# Patient Record
Sex: Male | Born: 1955 | Race: White | Hispanic: No | Smoking: Former smoker
Health system: Southern US, Community
[De-identification: ages and names within clinical notes are randomized; demographics above are authoritative.]

## PROBLEM LIST (undated history)

## (undated) DIAGNOSIS — T7840XA Allergy, unspecified, initial encounter: Secondary | ICD-10-CM

## (undated) DIAGNOSIS — Q602 Renal agenesis, unspecified: Secondary | ICD-10-CM

## (undated) DIAGNOSIS — R7989 Other specified abnormal findings of blood chemistry: Secondary | ICD-10-CM

## (undated) DIAGNOSIS — K219 Gastro-esophageal reflux disease without esophagitis: Secondary | ICD-10-CM

## (undated) DIAGNOSIS — G4730 Sleep apnea, unspecified: Secondary | ICD-10-CM

## (undated) DIAGNOSIS — R945 Abnormal results of liver function studies: Secondary | ICD-10-CM

## (undated) HISTORY — DX: Gastro-esophageal reflux disease without esophagitis: K21.9

## (undated) HISTORY — PX: OTHER SURGICAL HISTORY: SHX169

## (undated) HISTORY — PX: NASAL SEPTUM SURGERY: SHX37

## (undated) HISTORY — DX: Abnormal results of liver function studies: R94.5

## (undated) HISTORY — DX: Allergy, unspecified, initial encounter: T78.40XA

## (undated) HISTORY — DX: Renal agenesis, unspecified: Q60.2

## (undated) HISTORY — DX: Sleep apnea, unspecified: G47.30

## (undated) HISTORY — DX: Other specified abnormal findings of blood chemistry: R79.89

---

## 2001-01-12 HISTORY — PX: SHOULDER ARTHROSCOPY W/ ROTATOR CUFF REPAIR: SHX2400

## 2002-01-12 HISTORY — PX: ESOPHAGOGASTRODUODENOSCOPY: SHX1529

## 2004-02-07 ENCOUNTER — Ambulatory Visit (HOSPITAL_COMMUNITY): Admission: RE | Admit: 2004-02-07 | Discharge: 2004-02-07 | Payer: Self-pay | Admitting: Family Medicine

## 2005-01-12 HISTORY — PX: COLONOSCOPY: SHX174

## 2005-02-02 ENCOUNTER — Ambulatory Visit (HOSPITAL_COMMUNITY): Admission: RE | Admit: 2005-02-02 | Discharge: 2005-02-02 | Payer: Self-pay | Admitting: Family Medicine

## 2005-10-16 ENCOUNTER — Ambulatory Visit (HOSPITAL_COMMUNITY): Admission: RE | Admit: 2005-10-16 | Discharge: 2005-10-16 | Payer: Self-pay | Admitting: Internal Medicine

## 2005-10-16 ENCOUNTER — Ambulatory Visit: Payer: Self-pay | Admitting: Internal Medicine

## 2006-04-21 ENCOUNTER — Ambulatory Visit: Payer: Self-pay | Admitting: Gastroenterology

## 2006-04-28 ENCOUNTER — Ambulatory Visit (HOSPITAL_COMMUNITY): Admission: RE | Admit: 2006-04-28 | Discharge: 2006-04-28 | Payer: Self-pay | Admitting: Gastroenterology

## 2007-11-21 ENCOUNTER — Ambulatory Visit (HOSPITAL_COMMUNITY): Admission: RE | Admit: 2007-11-21 | Discharge: 2007-11-21 | Payer: Self-pay | Admitting: Family Medicine

## 2008-03-12 HISTORY — PX: OTHER SURGICAL HISTORY: SHX169

## 2008-11-05 ENCOUNTER — Ambulatory Visit (HOSPITAL_COMMUNITY): Admission: RE | Admit: 2008-11-05 | Discharge: 2008-11-05 | Payer: Self-pay | Admitting: Family Medicine

## 2008-11-06 ENCOUNTER — Encounter (INDEPENDENT_AMBULATORY_CARE_PROVIDER_SITE_OTHER): Payer: Self-pay | Admitting: *Deleted

## 2008-11-06 LAB — CONVERTED CEMR LAB
BUN: 18 mg/dL
Chloride: 100 meq/L
HDL: 44 mg/dL
Potassium: 4.3 meq/L

## 2009-01-02 ENCOUNTER — Encounter (INDEPENDENT_AMBULATORY_CARE_PROVIDER_SITE_OTHER): Payer: Self-pay | Admitting: *Deleted

## 2009-01-02 LAB — CONVERTED CEMR LAB
ALT: 24 units/L
Alkaline Phosphatase: 71 units/L
Bilirubin, Direct: 0.4 mg/dL
Total Protein: 7.2 g/dL

## 2009-01-03 ENCOUNTER — Ambulatory Visit (HOSPITAL_COMMUNITY): Admission: RE | Admit: 2009-01-03 | Discharge: 2009-01-03 | Payer: Self-pay | Admitting: Family Medicine

## 2009-01-03 ENCOUNTER — Encounter: Payer: Self-pay | Admitting: Cardiology

## 2009-01-10 ENCOUNTER — Ambulatory Visit (HOSPITAL_COMMUNITY): Admission: RE | Admit: 2009-01-10 | Discharge: 2009-01-10 | Payer: Self-pay | Admitting: Family Medicine

## 2009-01-21 ENCOUNTER — Ambulatory Visit (HOSPITAL_COMMUNITY)
Admission: RE | Admit: 2009-01-21 | Discharge: 2009-01-21 | Payer: Self-pay | Admitting: Physical Medicine and Rehabilitation

## 2009-01-25 ENCOUNTER — Encounter (INDEPENDENT_AMBULATORY_CARE_PROVIDER_SITE_OTHER): Payer: Self-pay | Admitting: *Deleted

## 2009-01-25 DIAGNOSIS — M2550 Pain in unspecified joint: Secondary | ICD-10-CM | POA: Insufficient documentation

## 2009-01-25 DIAGNOSIS — G4700 Insomnia, unspecified: Secondary | ICD-10-CM | POA: Insufficient documentation

## 2009-01-25 DIAGNOSIS — H9190 Unspecified hearing loss, unspecified ear: Secondary | ICD-10-CM | POA: Insufficient documentation

## 2009-01-25 DIAGNOSIS — R079 Chest pain, unspecified: Secondary | ICD-10-CM | POA: Insufficient documentation

## 2009-01-29 ENCOUNTER — Ambulatory Visit: Payer: Self-pay | Admitting: Cardiology

## 2009-01-29 ENCOUNTER — Encounter (INDEPENDENT_AMBULATORY_CARE_PROVIDER_SITE_OTHER): Payer: Self-pay | Admitting: *Deleted

## 2009-01-29 DIAGNOSIS — E785 Hyperlipidemia, unspecified: Secondary | ICD-10-CM | POA: Insufficient documentation

## 2009-02-05 ENCOUNTER — Ambulatory Visit: Payer: Self-pay | Admitting: Cardiology

## 2009-05-20 ENCOUNTER — Ambulatory Visit (HOSPITAL_COMMUNITY): Admission: RE | Admit: 2009-05-20 | Discharge: 2009-05-20 | Payer: Self-pay | Admitting: Family Medicine

## 2010-02-11 NOTE — Letter (Signed)
Summary: CT CHEST  CT CHEST   Imported By: Faythe Ghee 02/01/2009 12:19:10  _____________________________________________________________________  External Attachment:    Type:   Image     Comment:   External Document

## 2010-02-11 NOTE — Assessment & Plan Note (Signed)
Summary: PER DR.Lilyan Punt FOR PERSISTENT LEFT SIDE CHEST PAIN/TG   Visit Type:  Initial Consult Primary Provider:  Dr.Scott Luking   History of Present Illness: Initial visit for this very pleasant 55 year old gentleman with chest discomfort and cardiovascular risk factors.  Phillip Mcguire has enjoyed generally good health.  He has had mild hyperlipidemia for which he has used over-the-counter agents.  He has gastroesophageal reflux disease for which he takes a PPI.  He has had no known cardiovascular issues, has never been seen by a cardiologist and has not undergone any significant cardiac testing.  In recent months,he has experienced virtually constant left lateral chest discomfort.  There is some associated chest wall tenderness.  There is no associated dyspnea, diaphoresis nor nausea.  The discomfort is somewhat aggravated by use of the left arm, but not by walking.   Current Medications (verified): 1)  Nexium 40 Mg Cpdr (Esomeprazole Magnesium) .... Take 1 Tab Daily 2)  Glucosamine 500 Mg Caps (Glucosamine Sulfate) .... Take 1 Tab Daily 3)  Niacin 500 Mg Tabs (Niacin) .... Take 1 Tab Daily 4)  Calcium-Vitamin D 600-200 Mg-Unit Tabs (Calcium-Vitamin D) .... Take 1 Tab Daily 5)  Vitamin C Cr 500 Mg Cr-Caps (Ascorbic Acid) .... Take 1 Tab Daily 6)  Fish Oil 1000 Mg Caps (Omega-3 Fatty Acids) .... Take 1 Tab Daily 7)  One-A-Day Mens  Tabs (Multiple Vitamin) .... 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 [place] Married  Tobacco Use - remote and modest  Alcohol Use - no Regular Exercise - no Drug Use - no  Past Medical History: CHEST PAIN Congenital absence of the right kidney Abnormal LFTs-mild elevation of total, indirect  and direct bilirubin HEARING LOSS (ICD-389.9) INSOMNIA (ICD-780.52)  Past Surgical History: Left ENT procedure, 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  All other systems reviewed and are negative.  Vital Signs:  Patient profile:   55 year old male Height:      71 inches Weight:      220 pounds BMI:     30.79 Pulse rate:   64 / minute BP sitting:   118 / 70  (right arm)  Vitals Entered By: Phillip Saa, CNA (January 29, 2009 12:54 PM)  Physical Exam  General:   General-Well-developed; no acute distress; proportionate height and weight HEENT-[place]/AT; PERRL; EOM intact; conjunctiva and lids nl:  Neck-No JVD; no carotid bruits: Endocrine-No thyromegaly: Lungs-No tachypnea, clear without rales, rhonchi or wheezes: CV-normal PMI; normal S1 and S; S4 present;  Abdomen-BS normal; soft and non-tender without masses or organomegaly: MS-No deformities, cyanosis or clubbing: Neurologic-Nl cranial nerves; symmetric strength and tone: Skin- Warm, no sig. lesions: Extremities-Nl distal pulses; no edema    Impression & Recommendations:  Problem # 1:  CHEST PAIN UNSPECIFIED (ICD-786.50) Chest discomfort is highly suggestive of a musculoskeletal etiology.  Patient has  already seen an orthopaedic surgeon and is planning physical therapy.  As a result of his cardiovascular risk factors and positive family history, a standard treadmill stress test will be performed.  If results are negative, no further testing or intervention will be  necessary.  Problem # 2:  HYPERLIPIDEMIA (ICD-272.4) Patient is currently taking a low dose of niacin, which is not probably reducing total cholesterol by very much.  I explained that consideration could be given to treatment with a statin or with a higher dose of niacin for his moderate hyperlipidemia but does not meet criteria for treatment in the setting of a fairly low pretest probability of coronary disease.  If he is particularly concerned about the risk of coronary disease, enhanced pharmacologic treatment would be reasonable.  Based upon my discussion with him, it does not sound as if he wishes to change his current regimen.  Other Orders: Treadmill (Treadmill)  Patient Instructions: 1)  Your physician recommends that you schedule a follow-up appointment in: as needed 2)  Your physician has requested that you limit the intake of sodium (salt) in your diet to two grams daily. Please see [facility] handout. 3)  Your physician has requested that you have an exercise tolerance test.  For further information please visit [email or web address]  Please also follow instruction sheet, as given. 4)  DASH DIET- DIETARY APPROACHES TO STOP HYPERTENSION

## 2010-02-11 NOTE — Miscellaneous (Signed)
Summary: labs liver 01/02/2009  Clinical Lists Changes  Observations: Added new observation of ALBUMIN: 4.3 g/dL (60/45/4098 1:19) Added new observation of PROTEIN, TOT: 7.2 g/dL (14/78/2956 2:13) Added new observation of SGPT (ALT): 24 units/L (01/02/2009 9:12) Added new observation of SGOT (AST): 20 units/L (01/02/2009 9:12) Added new observation of ALK PHOS: 71 units/L (01/02/2009 9:12) Added new observation of BILI DIRECT: 0.4 mg/dL (08/65/7846 9:62) Added new observation of CALCIUM: 9.9 mg/dL (95/28/4132 4:40) Added new observation of CREATININE: 1.05 mg/dL (11/08/2534 6:44) Added new observation of BUN: 18 mg/dL (03/47/4259 5:63) Added new observation of BG RANDOM: 93 mg/dL (87/56/4332 9:51) Added new observation of CO2 PLSM/SER: 27 meq/L (11/06/2008 9:12) Added new observation of CL SERUM: 100 meq/L (11/06/2008 9:12) Added new observation of K SERUM: 4.3 meq/L (11/06/2008 9:12) Added new observation of NA: 141 meq/L (11/06/2008 9:12) Added new observation of LDL: 146 mg/dL (88/41/6606 3:01) Added new observation of HDL: 44 mg/dL (60/10/9321 5:57) Added new observation of TRIGLYC TOT: 141 mg/dL (32/20/2542 7:06) Added new observation of CHOLESTEROL: 218 mg/dL (23/76/2831 5:17)

## 2010-02-11 NOTE — Letter (Signed)
Summary:  Treadmill (Nuc Med Stress)  [facility]  [address], [place] [address]   Phone: [phone]  Fax: [phone]    Nuclear Medicine 1-Day Stress Test Information Sheet  Re:     Phillip Mcguire   DOB:     1955/09/26 MRN:     [id number] Weight:  Appointment Date: Register at: Appointment Time: Referring MD:  _X__Exercise Stress  __Adenosine   __Dobutamine  __Lexiscan  __Persantine   __Thallium  Urgency: ____1 (next day)   ____2 (one week)    ____3 (PRN)  Patient will receive Follow Up call with results: Patient needs follow-up appointment:  Instructions regarding medication:  How to prepare for your stress test: 1. DO NOT eat or dring 6 hours prior to your arrival time. This includes no caffeine (coffee, tea, sodas, chocolate) if you were instructed to take your medications, drink water with it. 2. DO NOT use any tobacco products for at leaset 8 hours prior to arrival. 3. DO NOT wear dresses or any clothing that may have metal clasps or buttons. 4. Wear short sleeve shirts, loose clothing, and comfortalbe walking shoes. 5. DO NOT use lotions, oils or powder on your chest before the test. 6. The test will take approximately 3-4 hours from the time you arrive until completion. 7. To register the day of the test, go to the Short Stay entrance at [facility]. 8. If you must cancel your test, call [phone] as soon as you are aware.  After you arrive for test:   When you arrive at [facility], you will go to Short Stay to be registered. They will then send you to Radiology to check in. The Nuclear Medicine Tech will get you and start an IV in your arm or hand. A small amount of a radioactive tracer will then be injected into your IV. This tracer will then have to circulate for 30-45 minutes. During this time you will wait in the waiting room and you will be able to drink something without caffeine. A series of pictures will be taken  of your heart follwoing this waiting period. After the 1st set of pictures you will go to the stress lab to get ready for your stress test. During the stress test, another small amount of a radioactive tracer will be injected through your IV. When the stress test is complete, there is a short rest period while your heart rate and blood pressure will be monitored. When this monitoring period is complete you will have another set of pictrues taken. (The same as the 1st set of pictures). These pictures are taken between 15 minutes and 1 hour after the stress test. The time depends on the type of stress test you had. Your doctor will inform you of your test results within 7 days after test.    The possibilities of certain changes are possible during the test. They include abnormal blood pressure and disorders of the heart. Side effects of persantine or adenosine can include flushing, chest pain, shortness of breath, stomach tightness, headache and light-headedness. These side effects usually do not last long and are self-resolving. Every effort will be made to keep you comfortable and to minimize complications by obtaining a medical history and by close observation during the test. Emergency equipment, medications, and trained personnel are available to deal with any unusual situation which may arise.  Please notify office at least 48 hours in advance if you are unable to keep  this appt.

## 2010-05-30 NOTE — Assessment & Plan Note (Signed)
[facility]                         GASTROENTEROLOGY OFFICE NOTE   FRAN, MCREE                       MRN:          [id number]  DATE:04/21/2006                            DOB:          1955-05-03    REASON FOR CONSULTATION:  Chest pain.   HISTORY:  Mr. Phillip Mcguire is a pleasant 55 year-old white male referred  through the courtesy of Dr. Gerda Diss for evaluation.  Over the past month,  he has been complaining of left-sided chest pain.  It is not exertional.  It is unaffected by eating, deep breaths, or movement.  It is described  as a soreness.  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  He denies cough, shortness of breath or fever.  Pain is somewhat  constant, and not exercise related.   PAST MEDICAL HISTORY:  Pertinent is unremarkable.  He underwent  screening colonoscopy in October 2007 that was normal.  He has a  congenitally absent right kidney.   FAMILY HISTORY:  Pertinent for both parents with heart disease.   MEDICATIONS INCLUDE:  Glucosamine and Nexium.   ALLERGIES:  None.   He neither smokes or drinks.  He is married and works in Production designer, theatre/television/film.   REVIEW OF SYSTEMS:  Positive for joint pains, insomnia, and some loss of  hearing.   PHYSICAL EXAM:  Pulse 68, blood pressure 108/60 weight 218.  HEENT: EOMI. PERRLA. Sclerae are anicteric.  Conjunctivae are pink.  NECK:  Supple without thyromegaly, adenopathy or carotid bruits.  CHEST:  He has some mild soreness in the chest wall just inferior to the  left axilla.  There is no soft tissue abnormalities. Clear to  auscultation and percussion without adventitious sounds.  CARDIAC:  Regular rhythm; normal S1 S2.  There are no murmurs, gallops  or rubs.  ABDOMEN:  Bowel sounds are normoactive.  Abdomen is soft, non-tender and  non-distended.  There are no abdominal masses, tenderness, splenic  enlargement or hepatomegaly.  EXTREMITIES:  Full range of motion.  No cyanosis, clubbing  or edema.  RECTAL:  Deferred.   IMPRESSION:  Chest wall pain.   RECOMMENDATIONS:  1. Chest x-ray.  2. A trial of Tylenol for pain.  I will avoid NSAIDs that he only has      one kidney.     Barbette Hair. Arlyce Dice, MD,FACG  Electronically Signed    RDK/MedQ  DD: 04/21/2006  DT: 04/21/2006  Job #: [id number]   cc:   Lorin Picket A. Gerda Diss, MD

## 2010-05-30 NOTE — Letter (Signed)
April 21, 2006    Scott A. Gerda Diss, MD  [address], [address]  [place], [place] [address]   RE:  ARNOLDO, HILDRETH  MRN:  [id number]  /  DOB:  1955-03-30   Dear Dr. Gerda Diss:   Upon your kind referral, I had the pleasure of evaluating your patient  and I am pleased to offer my findings.  I saw Thane Age in the office  today.  Enclosed is a copy of my progress note that details my findings  and recommendations.   Thank you for the opportunity to participate in your patient's care.    Sincerely,      Barbette Hair. Arlyce Dice, MD,FACG  Electronically Signed    RDK/MedQ  DD: 04/21/2006  DT: 04/21/2006  Job #: [id number]

## 2010-05-30 NOTE — Op Note (Signed)
NAME:  Phillip Mcguire, Phillip Mcguire                ACCOUNT NO.:  [id number]   MEDICAL RECORD NO.:  [id number]          PATIENT TYPE:  AMB   LOCATION:  DAY                           FACILITY:  [facility]   PHYSICIAN:  R. Roetta Sessions, M.D. DATE OF BIRTH:  [birth year]   DATE OF PROCEDURE:  10/16/2005  DATE OF DISCHARGE:                                 OPERATIVE REPORT   PROCEDURE:  Screening colonoscopy.   INDICATIONS FOR PROCEDURE:  Patient is a 55 year old gentleman, devoid of  lower GI symptoms who is now referred after seeing Dr. Lilyan Punt for  colorectal cancer screening.  He has no bowel symptoms.  There is no history  of in any first degree relatives of colon cancer.  He has never had his  lower GI tract imaged.  Colonoscopy is now being done.  This approach has  been discussed with the patient at length.  Potential risks, benefits and  alternatives have been reviewed and questions answered.  He is agreeable.  Please see documentation on the medical record.   PROCEDURE NOTE:  O2 saturation, blood pressure, pulses, and respirations  were monitored throughout the entire procedure.  Conscious sedation with IV  Demerol and Versed in incremental doses.   INSTRUMENT:  Olympus video chip system.   FINDINGS:  Digital rectal exam revealed no abnormalities.   ENDOSCOPIC FINDINGS:  Prep was good.   RECTAL:  Examination of the rectal mucosa, including retroflexion of the  anal verge, revealed no abnormalities.   COLON:  The colonic mucosa was surveyed from the rectosigmoid junction to  the left transverse, right colon, the appendiceal orifice, the ileocecal  valve, and cecum.  These structures were well seen and photographed for the  record.  From this level, the scope was slowly withdrawn and all previously  mentioned mucosal surfaces were again seen.  The colonic mucosa appeared  normal.  The patient tolerated the procedure well.  Cecal withdrawal time  eight minutes.   ENDOSCOPY IMPRESSION:   Normal rectum, normal colon.   RECOMMENDATIONS:  Repeat screening colonoscopy in 10 years.     [name], M.D.  Electronically Signed    RMR/MEDQ  D:  10/16/2005  T:  10/17/2005  Job:  [id number]

## 2010-06-13 ENCOUNTER — Ambulatory Visit: Payer: BC Managed Care – PPO | Attending: Family Medicine

## 2010-06-13 DIAGNOSIS — G4730 Sleep apnea, unspecified: Secondary | ICD-10-CM | POA: Insufficient documentation

## 2010-06-13 DIAGNOSIS — G4710 Hypersomnia, unspecified: Secondary | ICD-10-CM | POA: Insufficient documentation

## 2010-06-16 NOTE — Procedures (Signed)
NAME:  Phillip Mcguire, Phillip Mcguire                ACCOUNT NO.:  [id number]  MEDICAL RECORD NO.:  [id number]          PATIENT TYPE:  OUT  LOCATION:  SLEEP LAB                     FACILITY:  [facility]  PHYSICIAN:  Calvary Difranco A. Gerilyn Pilgrim, M.D. DATE OF BIRTH:  1955/03/05  DATE OF STUDY:  06/13/2010                           NOCTURNAL POLYSOMNOGRAM  REFERRING PHYSICIAN:  Lilyan Punt  REFERRING PHYSICIAN:  Scott A. Luking, MD  INDICATIONS:  A 55 year old who presents with significant hypersomnia, snoring, insomnia, and jerking while sleeping at night.  INDICATION FOR STUDY:  EPWORTH SLEEPINESS SCORE:  MEDICATIONS:  Nexium.  EPWORTH SLEEPINESS SCALE:  15.  BMI 31.  ARCHITECTURAL SUMMARY:  The total recording time is 404 minutes.  Sleep efficiency 79%.  Sleep latency 6 minutes.  REM latency 102 minutes. Stage N1 5.3%, N2 59.6%, N3 24.7%, and REM sleep 10.3%.  RESPIRATORY SUMMARY:  Baseline oxygen saturation 96, lowest saturation 89 during REM sleep.  Diagnostic AHI is 0.6 and RDI also 0.6.  LIMB MOVEMENT SUMMARY:  PLM index 0.  ELECTROCARDIOGRAM SUMMARY:  Average heart rate 65 with no significant dysrhythmias observed.  IMPRESSION:  Unremarkable nocturnal polysomnography.  RECOMMENDATIONS:  Consider further evaluation with sleep consultation to evaluate for other causes of hypersomnia, especially given the elevated Epworth sleepiness scale.  SLEEP ARCHITECTURE:  RESPIRATORY DATA:  OXYGEN DATA:  CARDIAC DATA:  MOVEMENT-PARASOMNIA:  IMPRESSIONS-RECOMMENDATIONS:     Nyron Mozer A. Gerilyn Pilgrim, M.D. Electronically Signed 06/16/2010 09:53:26    KAD/MEDQ  D:  06/15/2010 17:51:17  T:  06/16/2010 02:10:42  Job:  [id number]

## 2010-07-21 ENCOUNTER — Encounter: Payer: Self-pay | Admitting: Pulmonary Disease

## 2010-07-22 ENCOUNTER — Encounter: Payer: Self-pay | Admitting: Pulmonary Disease

## 2010-07-22 ENCOUNTER — Ambulatory Visit (INDEPENDENT_AMBULATORY_CARE_PROVIDER_SITE_OTHER): Payer: BC Managed Care – PPO | Admitting: Pulmonary Disease

## 2010-07-22 VITALS — BP 120/78 | HR 66 | Temp 97.9°F | Ht 72.0 in | Wt 222.6 lb

## 2010-07-22 DIAGNOSIS — G478 Other sleep disorders: Secondary | ICD-10-CM

## 2010-07-22 NOTE — Patient Instructions (Signed)
Will set you up for a home sleep study at no charge.  Your history is really suggestive of sleep disordered breathing Work on weight loss Work on getting more sleep at night.  You need six hours at a minimum. Will call you once I get the results.

## 2010-07-22 NOTE — Progress Notes (Signed)
  Subjective:    Patient ID: Phillip Mcguire, male    DOB: [birth year], 55 y.o.   MRN: [id number]  HPI The pt is a 55y/o male who I have been asked to see for hypersomnia.  He has recently had a sleep study which showed no clinically significant SDB, and nothing to explain his symptoms.  The pt states that he has had EDS for only a few yrs, and no issues in young adulthood.  He has been noted to have loud snoring, as well as an abnormal breathing pattern during sleep.  He does not get enough sleep at night, only averaging about 5 hrs a night.  He has frequent awakenings at night for unknown reasons, but he thinks is snoring arousals.  He is not rested upon arising, but gets up at 330am.  He stays very busy at work, but does note sleepiness at lunch breaks and with paperwork.  He does fall asleep watching tv in the evening.  He has only occasional sleepiness with driving.  He denies any chronic h/a but does have on occasions.  He has not had visual changes or other neuro symptoms.  His weight is neutral from 2 yrs ago.      Review of Systems  Constitutional: Negative for fever and unexpected weight change.  HENT: Positive for ear pain, congestion and sneezing. Negative for nosebleeds, sore throat, rhinorrhea, trouble swallowing, dental problem, postnasal drip and sinus pressure.   Eyes: Negative for redness and itching.  Respiratory: Negative for cough, chest tightness, shortness of breath and wheezing.   Cardiovascular: Negative for palpitations and leg swelling.  Gastrointestinal: Positive for abdominal pain. Negative for nausea and vomiting.  Genitourinary: Negative for dysuria.  Musculoskeletal: Positive for joint swelling.  Skin: Negative for rash.  Neurological: Positive for headaches.  Hematological: Does not bruise/bleed easily.  Psychiatric/Behavioral: Negative for dysphoric mood. The patient is not nervous/anxious.        Objective:   Physical Exam Constitutional:  Well developed, no  acute distress  HENT:  Nares patent without discharge, but deviation to left with turbinate hypertrophy bilat  Oropharynx without exudate, palate and uvula are elongated, +tonsillar hypertrophy  Eyes:  Perrla, eomi, no scleral icterus  Neck:  No JVD, no TMG  Cardiovascular:  Normal rate, regular rhythm, no rubs or gallops.  No murmurs        Intact distal pulses  Pulmonary :  Normal breath sounds, no stridor or respiratory distress   No rales, rhonchi, or wheezing  Abdominal:  Soft, nondistended, bowel sounds present.  No tenderness noted.   Musculoskeletal:  No lower extremity edema noted.  Lymph Nodes:  No cervical lymphadenopathy noted  Skin:  No cyanosis noted  Neurologic:  Alert, appropriate, moves all 4 extremities without obvious deficit.         Assessment & Plan:

## 2010-07-22 NOTE — Assessment & Plan Note (Addendum)
The pt's history is very suggestive of sleep disordered breathing.  He has abnormal upper airway anatomy, has frequent awakenings that he believes is due to snoring, and has non restorative sleep with EDS.  Part of this may be due to his short total sleep time, and awakening at 3:30 to go to work.  There is nothing to suggest a daytime sleep disorder such as narcolepsy or idiopathic hypersomnia.  He has no neuro symptoms suggestive of a CNS lesion, but needs to be kept in mind.  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  I would like to do a home sleep test at no charge to him, to give him every opportunity to manifest OSA.  If this is unremarkable, the pt will have to make some decisions regarding trial of weight loss vs upper airway surgery vs dental appliance.  Will discuss with him once results are available.  Will also need to consider doing MSLT for further evaluation.

## 2010-07-27 ENCOUNTER — Encounter: Payer: Self-pay | Admitting: Pulmonary Disease

## 2010-07-28 ENCOUNTER — Telehealth: Payer: Self-pay | Admitting: Pulmonary Disease

## 2010-07-28 NOTE — Telephone Encounter (Signed)
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  Unclear whether this is the issue with the pt vs sleep hygiene vs undiagnosed problem.  Will arrange for ov to discuss and develop a treatment plan  Megan, let pt know his sleep study showed extremely mild osa.  He needs ov this week or after I get back from vacation to discuss and come up with a plan.

## 2010-07-28 NOTE — Telephone Encounter (Signed)
Called and spoke with pt. Pt scheduled to see kc on wed. 7/19 at 2:15pm

## 2010-07-30 ENCOUNTER — Encounter: Payer: Self-pay | Admitting: Pulmonary Disease

## 2010-07-30 ENCOUNTER — Ambulatory Visit (INDEPENDENT_AMBULATORY_CARE_PROVIDER_SITE_OTHER): Payer: BC Managed Care – PPO | Admitting: Pulmonary Disease

## 2010-07-30 VITALS — BP 106/70 | HR 60 | Temp 98.0°F | Ht 72.0 in | Wt 220.0 lb

## 2010-07-30 DIAGNOSIS — G4733 Obstructive sleep apnea (adult) (pediatric): Secondary | ICD-10-CM

## 2010-07-30 NOTE — Patient Instructions (Signed)
Will start on cpap at moderate pressure level.  Please call if having tolerance issues. Work on weight loss followup with me in 5 weeks.  

## 2010-08-02 ENCOUNTER — Encounter: Payer: Self-pay | Admitting: Pulmonary Disease

## 2010-08-02 NOTE — Assessment & Plan Note (Signed)
The pt has mild osa by his recent home sleep study, and has significant sleep disruption and daytime sleepiness.   I have outlined various treatment options for him, including trial of weight loss alone, upper airway surgery, dental appliance, and cpap.  It is unclear how much this degree of osa is impacting his symptoms, and therefore would consider cpap trial first.  The pt is willing to try. I will set the patient up on cpap at a moderate pressure level to allow for desensitization, and will troubleshoot the device over the next 4-6weeks if needed.  The pt is to call me if having issues with tolerance.  Will then optimize the pressure once patient is able to wear cpap on a consistent basis.

## 2010-08-02 NOTE — Progress Notes (Signed)
  Subjective:    Patient ID: Phillip Mcguire, male    DOB: 12-24-1955, 55 y.o.   MRN: [id number]  HPI The pt comes in today for f/u of his recent home sleep study.  He was found to have mild osa, with AHI 5/hr.  I have reviewed the study with him in detail, and answered all of his questions.   Review of Systems  Constitutional: Negative for fever and unexpected weight change.  HENT: Negative for ear pain, nosebleeds, congestion, sore throat, rhinorrhea, sneezing, trouble swallowing, dental problem, postnasal drip and sinus pressure.   Eyes: Negative for redness and itching.  Respiratory: Negative for cough, chest tightness, shortness of breath and wheezing.   Cardiovascular: Negative for palpitations and leg swelling.  Gastrointestinal: Negative for nausea and vomiting.  Genitourinary: Negative for dysuria.  Musculoskeletal: Negative for joint swelling.  Skin: Negative for rash.  Neurological: Negative for headaches.  Hematological: Does not bruise/bleed easily.  Psychiatric/Behavioral: Negative for dysphoric mood. The patient is not nervous/anxious.        Objective:   Physical Exam Ow male in nad Nares without discharge or purulence LE without edema, no cyanosis noted. Awake, but appears sleepy.  Moves all 4        Assessment & Plan:

## 2010-08-07 ENCOUNTER — Encounter: Payer: Self-pay | Admitting: Pulmonary Disease

## 2010-09-03 ENCOUNTER — Ambulatory Visit (INDEPENDENT_AMBULATORY_CARE_PROVIDER_SITE_OTHER): Payer: BC Managed Care – PPO | Admitting: Pulmonary Disease

## 2010-09-03 ENCOUNTER — Encounter: Payer: Self-pay | Admitting: Pulmonary Disease

## 2010-09-03 VITALS — BP 126/70 | HR 63 | Temp 98.3°F | Ht 72.0 in | Wt 222.6 lb

## 2010-09-03 DIAGNOSIS — G4733 Obstructive sleep apnea (adult) (pediatric): Secondary | ICD-10-CM

## 2010-09-03 NOTE — Progress Notes (Signed)
  Subjective:    Patient ID: Phillip Mcguire, male    DOB: 1955-09-30, 55 y.o.   MRN: [id number]  HPI The patient comes in today for follow up of his very mild sleep apnea.  He was having frequent awakenings at night, nonrestorative sleep, and daytime sleepiness.  However, his AHI was only 5 per hour.  The patient also has a short total sleep time, typically sleeping no more than 5 hours a night and starting his day at 3:30 AM.  He has been on CPAP at a moderate pressure levels since the last visit, but has not really seen a change in his sleep or daytime alertness.  He feels that he is "still getting used to the machine".   Review of Systems  Constitutional: Negative for fever and unexpected weight change.  HENT: Positive for congestion, sneezing and sinus pressure. Negative for ear pain, nosebleeds, sore throat, rhinorrhea, trouble swallowing, dental problem and postnasal drip.   Eyes: Positive for itching. Negative for redness.  Respiratory: Negative for cough, chest tightness, shortness of breath and wheezing.   Cardiovascular: Negative for palpitations and leg swelling.  Gastrointestinal: Negative for nausea and vomiting.  Genitourinary: Negative for dysuria.  Musculoskeletal: Negative for joint swelling.  Skin: Negative for rash.  Neurological: Positive for headaches.  Hematological: Does not bruise/bleed easily.  Psychiatric/Behavioral: Negative for dysphoric mood. The patient is not nervous/anxious.        Objective:   Physical Exam Obese male in no acute distress No skin breakdown or pressure necrosis from the CPAP mask No purulence or discharge from nose Lower extremities with mild edema, no cyanosis noted Appears mildly sleepy, moves all 4 extremities.       Assessment & Plan:

## 2010-09-03 NOTE — Assessment & Plan Note (Signed)
The patient has fragmented sleep and alertness issues during the day, but it is unclear if it is due to his very mild sleep apnea.  The fact that he saw no difference with CPAP at a moderate setting makes me question whether sleep disordered breathing is the issue here.  This may simply be due to his poor sleep hygiene and limited quantity of sleep.  He may also have some other issue that we have not pinpointed that is disrupting his sleep as well.  He really has no history to suggest narcolepsy or idiopathic hypersomnia.  I would like to change his CPAP machine over to the automatic mode for a few weeks, and see if he notices a difference.  Will also get a download to make sure that he is wearing the device.  If he truly does not respond to CPAP, would discontinue.  Then we would have to decide whether to place the burden on the patient to improve his sleep hygiene, whether to check a MS LT, or whether we empirically treat him with a stimulant medication during the day to help with his alertness and functional status.

## 2010-09-03 NOTE — Patient Instructions (Signed)
Will set machine on auto mode for the next 2-3 weeks to optimize your pressure Please call us the day your machine gets downloaded, so we can call and get report faxed to [place]. Work on [employer] loss Will call you once I get the downloaded report.

## 2011-07-24 ENCOUNTER — Other Ambulatory Visit: Payer: Self-pay | Admitting: Family Medicine

## 2011-07-24 ENCOUNTER — Ambulatory Visit (HOSPITAL_COMMUNITY)
Admission: RE | Admit: 2011-07-24 | Discharge: 2011-07-24 | Disposition: A | Discharge status: Home / Self Care | Payer: BC Managed Care – PPO | Source: Ambulatory Visit | Attending: Family Medicine | Admitting: Family Medicine

## 2011-07-24 DIAGNOSIS — R05 Cough: Secondary | ICD-10-CM

## 2011-07-24 DIAGNOSIS — R059 Cough, unspecified: Secondary | ICD-10-CM | POA: Insufficient documentation

## 2012-05-23 ENCOUNTER — Other Ambulatory Visit: Payer: Self-pay | Admitting: Family Medicine

## 2012-10-08 ENCOUNTER — Encounter: Payer: Self-pay | Admitting: *Deleted

## 2012-10-13 ENCOUNTER — Encounter: Payer: Self-pay | Admitting: Family Medicine

## 2012-10-13 ENCOUNTER — Ambulatory Visit (INDEPENDENT_AMBULATORY_CARE_PROVIDER_SITE_OTHER): Payer: BC Managed Care – PPO | Admitting: Family Medicine

## 2012-10-13 VITALS — BP 122/80 | Ht 71.5 in | Wt 221.0 lb

## 2012-10-13 DIAGNOSIS — J019 Acute sinusitis, unspecified: Secondary | ICD-10-CM

## 2012-10-13 DIAGNOSIS — M2550 Pain in unspecified joint: Secondary | ICD-10-CM

## 2012-10-13 DIAGNOSIS — G629 Polyneuropathy, unspecified: Secondary | ICD-10-CM

## 2012-10-13 DIAGNOSIS — G609 Hereditary and idiopathic neuropathy, unspecified: Secondary | ICD-10-CM

## 2012-10-13 DIAGNOSIS — E538 Deficiency of other specified B group vitamins: Secondary | ICD-10-CM

## 2012-10-13 DIAGNOSIS — E539 Vitamin B deficiency, unspecified: Secondary | ICD-10-CM

## 2012-10-13 MED ORDER — LEVOFLOXACIN 500 MG PO TABS: 500.0000 mg | ORAL_TABLET | Freq: Every day | ORAL | Status: AC

## 2012-10-13 NOTE — Patient Instructions (Signed)
Stop Niacin

## 2012-10-13 NOTE — Progress Notes (Signed)
  Subjective:    Patient ID: Phillip Mcguire, male    DOB: 07-Jan-1956, 57 y.o.   MRN: [id number]  Sinusitis This is a new problem. The current episode started 1 to 4 weeks ago. The problem is unchanged. There has been no fever. He is experiencing no pain. Associated symptoms include coughing, sinus pressure and sneezing. Past treatments include nothing. The treatment provided no relief.    Patient states that his feet has been burning on and off for about several months now.  Some tinglimg feels on fire by the end of the day. Tried nothing. No history of this Had wart on r foot , gone now   Review of Systems  HENT: Positive for sneezing and sinus pressure.   Respiratory: Positive for cough.    Patient does relate a little bit of foot burning. He states his feet been burning over the past several weeks he also states recently sinus pressure drainage coughing denies wheezing denies shortness of breath. Patient states he's tried nothing for the feet he has a family history of diabetes has never had this problem before. Social does not smoke denies excessive thirst or urination.    Objective:   Physical Exam Neck is supple sinus moderate tenderness throat normal eardrums normal lungs are clear hearts regular pulses in the feet are good there is some rubor appearance to the feet but pulses are good monofilament testing he has slight neuropathy in the big toe but otherwise good       Assessment & Plan:  Neuropathy-check lab work first if lab work is negative next step is nerve conduction studies. If this is negative might try low-dose gabapentin or potentially get neurologic consultation  Sinusitis antibiotics prescribed call if ongoing troubles

## 2012-10-14 LAB — CBC WITH DIFFERENTIAL/PLATELET
HCT: 45.3 % (ref 39.0–52.0)
Hemoglobin: 15.8 g/dL (ref 13.0–17.0)
Lymphocytes Relative: 42 % (ref 12–46)
Lymphs Abs: 3.5 10*3/uL (ref 0.7–4.0)
MCHC: 34.9 g/dL (ref 30.0–36.0)
Monocytes Absolute: 0.6 10*3/uL (ref 0.1–1.0)
Monocytes Relative: 7 % (ref 3–12)
Neutro Abs: 4.2 10*3/uL (ref 1.7–7.7)
RBC: 5.35 MIL/uL (ref 4.22–5.81)

## 2012-10-14 LAB — SEDIMENTATION RATE: Sed Rate: 1 mm/hr (ref 0–16)

## 2012-10-14 LAB — HEMOGLOBIN A1C: Mean Plasma Glucose: 123 mg/dL — ABNORMAL HIGH (ref <117)

## 2012-10-14 LAB — VITAMIN B12: Vitamin B-12: 708 pg/mL (ref 211–911)

## 2012-10-20 ENCOUNTER — Encounter: Payer: Self-pay | Admitting: Family Medicine

## 2012-10-20 ENCOUNTER — Ambulatory Visit (INDEPENDENT_AMBULATORY_CARE_PROVIDER_SITE_OTHER): Payer: BC Managed Care – PPO | Admitting: Family Medicine

## 2012-10-20 VITALS — BP 128/86 | Ht 71.5 in | Wt 220.8 lb

## 2012-10-20 DIAGNOSIS — R7303 Prediabetes: Secondary | ICD-10-CM | POA: Insufficient documentation

## 2012-10-20 DIAGNOSIS — G629 Polyneuropathy, unspecified: Secondary | ICD-10-CM | POA: Insufficient documentation

## 2012-10-20 DIAGNOSIS — G609 Hereditary and idiopathic neuropathy, unspecified: Secondary | ICD-10-CM

## 2012-10-20 DIAGNOSIS — R7309 Other abnormal glucose: Secondary | ICD-10-CM

## 2012-10-20 MED ORDER — CEFPROZIL 500 MG PO TABS: 500.0000 mg | ORAL_TABLET | Freq: Two times a day (BID) | ORAL | Status: DC

## 2012-10-20 MED ORDER — GABAPENTIN 100 MG PO CAPS: 100.0000 mg | ORAL_CAPSULE | Freq: Three times a day (TID) | ORAL | Status: DC

## 2012-10-20 NOTE — Progress Notes (Signed)
  Subjective:    Patient ID: Phillip Mcguire, male    DOB: 1955-03-19, 57 y.o.   MRN: [id number]  HPI  Patient arrives for a follow up of burning in his feet. Also c/o r ear pain Labs reviewed   Review of Systems  Constitutional: Negative for fever, chills and appetite change.  HENT: Positive for ear pain and sinus pressure.   Respiratory: Negative for cough and chest tightness.   Cardiovascular: Negative for chest pain.       Objective:   Physical Exam  Vitals reviewed. Constitutional: He appears well-developed and well-nourished.  HENT:  Left Ear: External ear normal.  r ear inflammed  Cardiovascular: Normal rate, regular rhythm and normal heart sounds.           Assessment & Plan:  20 min discussed periph neuropathy to do NCV Gave rx for neurontin- start 100 mg qhs for 5 days then 1 bid ( may neeed to go to tid) start med afetr NCV Prediabetes- could be cause of neuropathy- diet discussed

## 2012-11-15 ENCOUNTER — Ambulatory Visit (INDEPENDENT_AMBULATORY_CARE_PROVIDER_SITE_OTHER): Payer: BC Managed Care – PPO | Admitting: Neurology

## 2012-11-15 ENCOUNTER — Encounter: Payer: Self-pay | Admitting: Neurology

## 2012-11-15 VITALS — BP 116/70 | HR 68 | Temp 98.2°F | Resp 16 | Ht 71.0 in | Wt 218.4 lb

## 2012-11-15 DIAGNOSIS — R209 Unspecified disturbances of skin sensation: Secondary | ICD-10-CM

## 2012-11-15 NOTE — Patient Instructions (Addendum)
1.  Check blood work today 2.  Start neurontin 100mg  at bedtime x 3 days, then 200mg  at bedtime x 3 days, then continue 300mg  at bedtime 3.  EMG of the right 4.  Return to clinic in 4 weeks

## 2012-11-15 NOTE — Progress Notes (Signed)
[facility] Clinic Note - Initial Visit   Date: 11/15/2012    Phillip Mcguire MRN: [id number] DOB: 08/08/1955   Dear Dr Gerda Diss:  Thank you for your kind referral of Phillip Mcguire for consultation of burning feet. Although his history is well known to you, please allow [place] to reiterate it for the purpose of our medical record. The patient was accompanied to the clinic by self.   History of Present Illness: Phillip Mcguire is a 57 y.o. year-old left-handed Caucasian male with history of GERD and arthritis presenting for evaluation of burning feet (right > left).  About 32-months ago, he started developing burning sensation at the sole of his feet.  Symptoms have become more constant over the past several months.  He started noticing redness over the dorsum of the feet a few months ago.  Being on his feet all day makes it worse.  No alleviating factors.  Denies any numbness or weakness.  He is concerned about diabetes because he has family history of it and stopped drinking sweet tea and soda, which he feels has helped somewhat.  He endorses dry mouth or reduced sweating.  Denies dry eyes or early satiety.  Out-side paper records, electronic medical record, and images have been reviewed where available and summarized as:  Component     Latest Ref Rng 10/13/2012  Hemoglobin A1C     <5.7 % 5.9 (H)  Mean Plasma Glucose     <117 mg/dL 784 (H)  Vitamin O-96     211 - 911 pg/mL 708  Sed Rate     0 - 16 mm/hr 1    Past Medical History  Diagnosis Date  . Hearing loss   . Abnormal LFTs     mild elevation of total, indirect and direct bilirubin  . Congenital absence of kidney     Right  . GERD (gastroesophageal reflux disease)   . Allergy   . Sleep apnea     Past Surgical History  Procedure Laterality Date  . Otosclerosis      bilateral ear surgery  . Nasal septum surgery  1980's  . Esophagogastroduodenoscopy  01/2002  . Colonoscopy  2007  . Titanium implant  Left March 2010    left middle ear  . Shoulder arthroscopy w/ rotator cuff repair Left 2003     Medications:  Current Outpatient Prescriptions on File Prior to Visit  Medication Sig Dispense Refill  . Cholecalciferol (VITAMIN D3) 1000 UNITS CAPS Take 1 capsule by mouth daily.        . Glucosamine-Chondroit-Vit C-Mn (GLUCOSAMINE 1500 COMPLEX PO) Take 1 capsule by mouth daily.        Marland Kitchen loratadine (CLARITIN) 10 MG tablet Take 10 mg by mouth daily.        . Multiple Vitamin (MULTIVITAMIN) capsule Take 1 capsule by mouth daily.        Marland Kitchen NEXIUM 40 MG capsule TAKE 1 CAPSULE BY MOUTH EVERY DAY  90 capsule  1  . vitamin C (ASCORBIC ACID) 500 MG tablet Take 240 mg by mouth daily.        No current facility-administered medications on file prior to visit.    Allergies:  Allergies  Allergen Reactions  . Latex     Family History: Family History  Problem Relation Age of Onset  . Heart disease Mother   . Hypertension Mother   . Diabetes Mother   . Hyperlipidemia Mother   . Heart disease Father   .  Lung cancer Father   . Hyperlipidemia Father   . Allergies Son   . Cancer Maternal Aunt     breast  . Cancer Maternal Uncle     colon  . Cancer Paternal Aunt     breast  . Cancer Paternal Uncle     brain tumor    Social History: History   Social History  . Marital Status: Married    Spouse Name: N/A    Number of Children: 1  . Years of Education: N/A   Occupational History  . 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     [employer]   Social History Main Topics  . Smoking status: Former Smoker -- 1.50 packs/day for 15 years    Types: Cigarettes    Quit date: 01/13/1988  . Smokeless tobacco: Never Used  . Alcohol Use: Yes     Comment: rarely drink beer  . Drug Use: No  . Sexual Activity: Not on file   Other Topics Concern  . Not on file   Social History Narrative   He works in [employer], Engineer, building services   Lives with wife.  They have one child.    Review of Systems:   CONSTITUTIONAL: No fevers, chills, night sweats, or weight loss.   EYES: No visual changes or eye pain ENT: No hearing changes.  No history of nose bleeds.   RESPIRATORY: No cough, wheezing and shortness of breath.   CARDIOVASCULAR: Negative for chest pain, and palpitations.   GI: Negative for abdominal discomfort, blood in stools or black stools.  No recent change in bowel habits.   GU:  No history of incontinence.   MUSCLOSKELETAL: No history of joint pain or swelling.  No myalgias.   SKIN: Negative for lesions, rash, and itching.   HEMATOLOGY/ONCOLOGY: Negative for prolonged bleeding, bruising easily, and swollen nodes.  ENDOCRINE: Negative for cold or heat intolerance, polydipsia or goiter.   PSYCH:  No depression or anxiety symptoms.   NEURO: As Above.   Vital Signs:  BP 116/70  Pulse 68  Temp(Src) 98.2 F (36.8 C) (Oral)  Resp 16  Ht 5\' 11"  (1.803 m)  Wt 218 lb 6.4 oz (99.066 kg)  BMI 30.47 kg/m2  Neurological Exam: MENTAL STATUS including orientation to time, place, person, recent and remote memory, attention span and concentration, language, and fund of knowledge is normal.  Speech is not dysarthric.  CRANIAL NERVES: II:  No visual field defects.  Unremarkable fundi.   III-IV-VI: Pupils equal round and reactive to light.  Normal conjugate, extra-ocular eye movements in all directions of gaze.  No nystagmus.  No ptosis prior to or post sustained upgaze.   V:  Normal facial sensation.   VII:  Normal facial symmetry and movements.  VIII:  Normal hearing and vestibular function.   IX-X:  Normal palatal movement.   XI:  Normal shoulder shrug and head rotation.   XII:  Normal tongue strength and range of motion, no deviation or fasciculation.  MOTOR:  No atrophy, fasciculations or abnormal movements.  No pronator drift.  Tone is normal.    Right Upper Extremity:    Left Upper Extremity:    Deltoid  5/5   Deltoid  5/5   Biceps  5/5   Biceps  5/5   Triceps  5/5   Triceps   5/5   Wrist extensors  5/5   Wrist extensors  5/5   Wrist flexors  5/5   Wrist flexors  5/5   Finger extensors  5/5   Finger extensors  5/5   Finger flexors  5/5   Finger flexors  5/5   Dorsal interossei  5/5   Dorsal interossei  5/5   Abductor pollicis  5/5   Abductor pollicis  5/5   Tone (Ashworth scale)  0  Tone (Ashworth scale)  0   Right Lower Extremity:    Left Lower Extremity:    Hip flexors  5/5   Hip flexors  5/5   Hip extensors  5/5   Hip extensors  5/5   Knee flexors  5/5   Knee flexors  5/5   Knee extensors  5/5   Knee extensors  5/5   Dorsiflexors  5/5   Dorsiflexors  5/5   Plantarflexors  5/5   Plantarflexors  5/5   Toe extensors  5/5   Toe extensors  5/5   Toe flexors  5/5   Toe flexors  5/5   Tone (Ashworth scale)  0  Tone (Ashworth scale)  0   MSRs:  Right                                                                 Left brachioradialis 2+  brachioradialis 2+  biceps 2+  biceps 2+  triceps 2+  triceps 2+  patellar 2+  patellar 2+  ankle jerk 2+  ankle jerk 2+  Hoffman no  Hoffman no  plantar response down  plantar response down   SENSORY:  Normal and symmetric perception of light touch, pinprick, vibration, and proprioception.  Romberg's sign absent.   COORDINATION/GAIT: Normal finger-to- nose-finger and heel-to-shin.  Intact rapid alternating movements bilaterally.  Able to rise from a chair without using arms.  Gait narrow based and stable. Tandem and stressed gait intact.    IMPRESSION: Mr. Phillip Mcguire is a 57 year old gentleman presenting for paresthesias of his feet. His neurological examination is entirely normal and non-focal.  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  I will check for treatable causes of neuropathy and obtain an EMG of the right lower extremity. In the meantime, for symptomatic management, I instructed him to take neurontin.   PLAN/RECOMMENDATIONS:  1.  Check 2hr  glucose tolerance test, TSH, copper, ceruloplasmin, SPEP/UPEP with IFE 2.  Start neurontin 100mg  and titrate further as instucted 3.  EMG of the right 4.  Return to clinic in 4 weeks  The duration of this appointment visit was 60 minutes of face-to-face time with the patient.  Greater than 50% of this time was spent in counseling, explanation of diagnosis, planning of further management, and coordination of care.   Thank you for allowing me to participate in patient's care.  If I can answer any additional questions, I would be pleased to do so.    Sincerely,    Viliami Bracco K. Allena Katz, DO

## 2012-11-16 ENCOUNTER — Other Ambulatory Visit: Payer: BC Managed Care – PPO

## 2012-11-16 DIAGNOSIS — R209 Unspecified disturbances of skin sensation: Secondary | ICD-10-CM

## 2012-11-16 LAB — GLUCOSE TOLERANCE, 2 HOURS
Glucose, 1 Hour GTT: 172 mg/dL
Glucose, 2 hour: 97 mg/dL
Glucose, Fasting: 100 mg/dL — ABNORMAL HIGH (ref 70–99)

## 2012-11-16 LAB — TSH: TSH: 1.16 u[IU]/mL (ref 0.35–5.50)

## 2012-11-16 NOTE — Addendum Note (Signed)
Addended by: Sadie Haber D on: 11/16/2012 08:08 AM   Modules accepted: Orders

## 2012-11-17 LAB — UIFE/LIGHT CHAINS/TP QN, 24-HR UR
Albumin, U: DETECTED
Free Kappa/Lambda Ratio: 8 ratio (ref 2.04–10.37)
Free Lambda Lt Chains,Ur: 0.03 mg/dL (ref 0.02–0.67)
Total Protein, Urine: 0.8 mg/dL

## 2012-11-17 LAB — PROTEIN ELECTROPHORESIS, SERUM
Albumin ELP: 61.2 % (ref 55.8–66.1)
Alpha-1-Globulin: 3.8 % (ref 2.9–4.9)
Alpha-2-Globulin: 7.7 % (ref 7.1–11.8)
Beta 2: 6.4 % (ref 3.2–6.5)
Beta Globulin: 7.3 % — ABNORMAL HIGH (ref 4.7–7.2)
Total Protein, Serum Electrophoresis: 7 g/dL (ref 6.0–8.3)

## 2012-11-24 ENCOUNTER — Ambulatory Visit (INDEPENDENT_AMBULATORY_CARE_PROVIDER_SITE_OTHER): Payer: BC Managed Care – PPO | Admitting: Neurology

## 2012-11-24 ENCOUNTER — Encounter: Payer: Self-pay | Admitting: Neurology

## 2012-11-24 DIAGNOSIS — R209 Unspecified disturbances of skin sensation: Secondary | ICD-10-CM

## 2012-11-24 NOTE — Progress Notes (Signed)
See procedure note under "Notes" for EMG results.  Donika K. Patel, DO  

## 2012-11-24 NOTE — Procedures (Signed)
[facility]  [address] [place], [address]  [place], [place] [address] Tel: [phone] Fax:  [phone] Test Date:  11/24/2012  Patient: Phillip Mcguire DOB: 11-26-1955 Physician: Nita Sickle, DO  Sex: Male Height: 5\' 11"  Ref Phys:   ID#: [id number]   Technician:    Patient Complaints: 57 year-old man presenting with paresthesias of his feet.  NCV & EMG Findings:  Extensive evaluation of the right lower extremity reveals: 1. Normal sural and superficial peroneal sensory responses. 2. Normal motor response of the peroneal and tibial nerves. 3. No evidence of active or chronic motor axonal loss on needle electrode examination.      Impression: This is a normal electrodiagnostic study of the right lower extremity. There is no evidence of a lumbosacral radiculopathy or large fiber generalized sensorimotor polyneuropathy affecting the right lower extremity. However, a small fiber sensory neuropathy cannot be excluded by this study.   ___________________________ Nita Sickle, DO    Nerve Conduction Studies Anti Sensory Summary Table   Site NR Peak (ms) Norm Peak (ms) P-T Amp (V) Norm P-T Amp  Right Sup Peroneal Anti Sensory (Ant Lat Mall)  12 cm    3.9 <4.6 9.8 >4  Right Sural Anti Sensory (Lat Mall)  Calf    3.9 <4.6 11.7 >4   Motor Summary Table   Site NR Onset (ms) Norm Onset (ms) O-P Amp (mV) Norm O-P Amp Site1 Site2 Delta-0 (ms) Dist (cm) Vel (m/s) Norm Vel (m/s)  Right Peroneal Motor (Ext Dig Brev)  Ankle    4.1 <6.0 7.1 >2.5 B Fib Ankle 10.1 41.0 41 >40  B Fib    14.2  6.1  Poplt B Fib 1.4 8.0 57 >40  Poplt    15.6  5.9         Right Tibial Motor (Abd Hall Brev)  Ankle    5.5 <6.0 9.0 >4 Knee Ankle 9.7 44.0 45 >40  Knee    15.2  6.0          H Reflex Studies   NR H-Lat (ms) Lat Norm (ms) L-R H-Lat (ms)  Left Tibial (Gastroc)     37.33 <35 0.45  Right Tibial (Gastroc)     37.78 <35 0.45   EMG   Side Muscle Ins Act Fibs Psw Fasc Number Recrt Dur  Dur. Amp Amp. Poly Poly. Comment  Right AntTibialis Nml Nml Nml Nml Nml Nml Nml Nml Nml Nml Nml Nml N/A  Right Gastroc Nml Nml Nml Nml 1- Mod Nml Nml Nml Nml Nml Nml N/A  Right Flex Dig Long Nml Nml Nml Nml Nml Nml Nml Nml Nml Nml Nml Nml N/A  Right RectFemoris Nml Nml Nml Nml Nml Nml Nml Nml Nml Nml Nml Nml N/A  Right GluteusMed Nml Nml Nml Nml Nml Nml Nml Nml Nml Nml Nml Nml N/A  Right BicepsFemS Nml Nml Nml Nml Nml Nml Nml Nml Nml Nml Nml Nml N/A      Waveforms:

## 2012-12-12 ENCOUNTER — Encounter: Payer: Self-pay | Admitting: Neurology

## 2012-12-13 ENCOUNTER — Encounter: Payer: Self-pay | Admitting: Neurology

## 2012-12-13 ENCOUNTER — Ambulatory Visit (INDEPENDENT_AMBULATORY_CARE_PROVIDER_SITE_OTHER): Payer: BC Managed Care – PPO | Admitting: Neurology

## 2012-12-13 VITALS — BP 110/68 | HR 68 | Temp 97.7°F | Ht 71.0 in | Wt 218.0 lb

## 2012-12-13 DIAGNOSIS — M545 Low back pain: Secondary | ICD-10-CM

## 2012-12-13 DIAGNOSIS — R209 Unspecified disturbances of skin sensation: Secondary | ICD-10-CM

## 2012-12-13 DIAGNOSIS — R208 Other disturbances of skin sensation: Secondary | ICD-10-CM

## 2012-12-13 NOTE — Progress Notes (Signed)
Follow-up Visit   Date: 12/13/2012    ROLAN WRIGHTSMAN MRN: [id number] DOB: 08-31-1955   Interim History: Phillip Mcguire is a 57 y.o. year-old left-handed Caucasian male with history of GERD and arthritis returning for follow-up of burning feet (right > left). He was last seen in the office on 11/15/2012.  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  He purchased new shoes which he thinks may also be helping because they are lighter.The redness over the dorsum of his feet has resolved.  Denies any numbness or weakness.  Neuropathy labs and EMG was normal.   History of present illness: About 29-months ago, he started developing burning sensation at the sole of his feet. Symptoms have become more constant over the past several months. He started noticing redness over the dorsum of the feet a few months ago. Being on his feet all day makes it worse.  He is concerned about diabetes because he has family history of it and stopped drinking sweet tea and soda, which he feels has helped somewhat. He endorses dry mouth or reduced sweating.   Medications:  Current Outpatient Prescriptions on File Prior to Visit  Medication Sig Dispense Refill  . Cholecalciferol (VITAMIN D3) 1000 UNITS CAPS Take 1 capsule by mouth daily.        . Glucosamine-Chondroit-Vit C-Mn (GLUCOSAMINE 1500 COMPLEX PO) Take 1 capsule by mouth daily.        Marland Kitchen loratadine (CLARITIN) 10 MG tablet Take 10 mg by mouth daily.        . Multiple Vitamin (MULTIVITAMIN) capsule Take 1 capsule by mouth daily.        Marland Kitchen NEXIUM 40 MG capsule TAKE 1 CAPSULE BY MOUTH EVERY DAY  90 capsule  1  . vitamin C (ASCORBIC ACID) 500 MG tablet Take 240 mg by mouth daily.        No current facility-administered medications on file prior to visit.    Allergies:  Allergies  Allergen Reactions  . Latex      Review of  Systems:  CONSTITUTIONAL: No fevers, chills, night sweats, or weight loss.   EYES: No visual changes or eye pain ENT: No hearing changes.  No history of nose bleeds.   RESPIRATORY: No cough, wheezing and shortness of breath.   CARDIOVASCULAR: Negative for chest pain, and palpitations.   GI: Negative for abdominal discomfort, blood in stools or black stools.  No recent change in bowel habits.   GU:  No history of incontinence.   MUSCLOSKELETAL: No history of joint pain or swelling.  No myalgias.   SKIN: Negative for lesions, rash, and itching.   ENDOCRINE: Negative for cold or heat intolerance, polydipsia or goiter.   PSYCH:  No depression or anxiety symptoms.   NEURO: As Above.   Vital Signs:  BP 110/68  Pulse 68  Temp(Src) 97.7 F (36.5 C) (Oral)  Ht 5\' 11"  (1.803 m)  Wt 218 lb (98.884 kg)  BMI 30.42 kg/m2  Neurological Exam: MENTAL STATUS including orientation to time, place, person, recent and remote memory, attention span and concentration, language, and fund of knowledge is normal.  Speech is not dysarthric.  CRANIAL NERVES: II:  No visual field defects.     III-IV-VI: Pupils equal round and reactive to light.  Normal conjugate, extra-ocular eye movements in all directions of gaze.  No nystagmus.  VII:  Normal facial symmetry and movements.    IX-X:  Normal palatal movement.   XI:  Normal shoulder shrug and head rotation.   XII:  Normal tongue strength and range of motion, no deviation or fasciculation.  MOTOR:  No atrophy, fasciculations or abnormal movements.  No pronator drift.  Tone is normal.    Right Upper Extremity:    Left Upper Extremity:    Deltoid  5/5   Deltoid  5/5   Biceps  5/5   Biceps  5/5   Triceps  5/5   Triceps  5/5   Wrist extensors  5/5   Wrist extensors  5/5   Wrist flexors  5/5   Wrist flexors  5/5   Finger extensors  5/5   Finger extensors  5/5   Finger flexors  5/5   Finger flexors  5/5   Dorsal interossei  5/5   Dorsal interossei  5/5     Abductor pollicis  5/5   Abductor pollicis  5/5   Tone (Ashworth scale)  0  Tone (Ashworth scale)  0   Right Lower Extremity:    Left Lower Extremity:    Hip flexors  5/5   Hip flexors  5/5   Hip extensors  5/5   Hip extensors  5/5   Knee flexors  5/5   Knee flexors  5/5   Knee extensors  5/5   Knee extensors  5/5   Dorsiflexors  5/5   Dorsiflexors  5/5   Plantarflexors  5/5   Plantarflexors  5/5   Toe extensors  5/5   Toe extensors  5/5   Toe flexors  5/5   Toe flexors  5/5   Tone (Ashworth scale)  0  Tone (Ashworth scale)  0   MSRs:  Right                                                                 Left brachioradialis 2+  brachioradialis 2+  biceps 2+  biceps 2+  triceps 2+  triceps 2+  patellar 3+  patellar 3+  ankle jerk 1+  ankle jerk 1+  Hoffman no  Hoffman no  plantar response up  plantar response up   SENSORY:  Normal and symmetric perception of light touch, pinprick, vibration, and proprioception.  Romberg's sign absent.    COORDINATION/GAIT: Normal finger-to- nose-finger and heel-to-shin.  Intact rapid alternating movements bilaterally.  Able to rise from a chair without using arms.  Gait narrow based and stable.   Data: EMG 11/24/2012:  This is a normal electrodiagnostic study of the right lower extremity. There is no evidence of a lumbosacral radiculopathy or large fiber generalized sensorimotor polyneuropathy affecting the right lower extremity. However, a small fiber sensory neuropathy cannot be excluded by this study.    Component     Latest Ref Rng 10/13/2012 11/15/2012 11/16/2012  Glucose, Fasting     70 - 99 mg/dL   191 (H)  Glucose, GTT - 1 Hour        172  Glucose, 2 hour        97  Vitamin B-12     211 - 911 pg/mL 708    Sed Rate     0 - 16 mm/hr 1    TSH  0.35 - 5.50 uIU/mL  1.16   Copper     70 - 175 mcg/dL  91   Ceruloplasmin     20 - 60 mg/dL  27      IMPRESSION: Mr. Jimmey Ralph is a 57 year old gentleman presenting for paresthesias of  his feet. His neurological examination shows mildly reduced ankle jerks bilaterally. His EMG did not demonstrate a large fiber neuropathy, however a distal small fiber neuropathy or a sensory S1 radiculopathy may manifest with similar symptoms.  I do not feel that the yield of QSART, TST, or skin biopsy for small fiber neuropathy would be helpful because his symptoms are distal to where testing would be performed.  I would like for Mr. Blakely to have physical therapy because because this may help low back pain as well as a sensory radiculopathy.  He apparently had a work-related injury a few years ago and developed back pain.  Imaging showed degenerative joing disease, per patient (no report to review) it would be prudent to re-evaluate his atypical symptoms.     PLAN/RECOMMENDATIONS:  1.  Recommend MRI lumbar spine to evaluate for S1 radiculopathy causing sensory symptoms of the feet 2.  Recommend physical therapy for low back pain possible radiculopathy 3.  Patient reports having intermittent spells low back pain since 2012 and it is possible these are worsening causing his current symptoms.  I have requested that he discuss this with his Buyer, retail 4.  Titrate neurontin to 300mg  at bedtime over 2 weeks 5.  Return to clinic 6-weeks   The duration of this appointment visit was 30 minutes of face-to-face time with the patient.  Greater than 50% of this time was spent in counseling, explanation of diagnosis, planning of further management, and coordination of care.   Thank you for allowing me to participate in patient's care.  If I can answer any additional questions, I would be pleased to do so.    Sincerely,    Donika K. Allena Katz, DO

## 2012-12-13 NOTE — Patient Instructions (Addendum)
1.  Recommend MRI lumbar spine to evaluate for S1 sensory radiculopathy causing sensory symptoms of the feet 2.  Recommend physical therapy for low back pain possible radiculopathy 3.  Patient reports having intermittent spells low back pain since 2012 and it is possible these are worsening causing his current symptoms.  I have requested that he discuss this with his Buyer, retail 4.  Increase neurontin to 100mg  at bedtime x 1 week, the increase to 2 tablets at bedtime x 1 week, then take 3 tablets thereafter, if you are tolerating it 5.  Return to clinic 6-weeks

## 2013-01-02 ENCOUNTER — Other Ambulatory Visit: Payer: Self-pay | Admitting: Family Medicine

## 2013-01-24 ENCOUNTER — Ambulatory Visit: Payer: BC Managed Care – PPO | Admitting: Neurology

## 2013-02-10 ENCOUNTER — Ambulatory Visit: Payer: BC Managed Care – PPO | Admitting: Neurology

## 2013-02-21 ENCOUNTER — Encounter: Payer: Self-pay | Admitting: Neurology

## 2013-02-21 ENCOUNTER — Ambulatory Visit (INDEPENDENT_AMBULATORY_CARE_PROVIDER_SITE_OTHER): Payer: BC Managed Care – PPO | Admitting: Neurology

## 2013-02-21 VITALS — BP 110/80 | HR 60 | Temp 98.2°F | Ht 72.05 in | Wt 214.1 lb

## 2013-02-21 DIAGNOSIS — R209 Unspecified disturbances of skin sensation: Secondary | ICD-10-CM

## 2013-02-21 NOTE — Progress Notes (Signed)
Follow-up Visit   Date: 02/21/2013    Phillip Mcguire MRN: [id number] DOB: 1955/01/17   Interim History: Phillip Mcguire is a 58 y.o. year-old left-handed Caucasian male with history of GERD and arthritis returning for follow-up of burning feet (right > left). He was last seen in the office on 12/13/2012.  History of present illness: About 82-month ago, he started developing burning sensation at the sole of his feet. Symptoms have become more constant over the past several months. He started noticing redness over the dorsum of the feet a few months ago. Being on his feet all day makes it worse.  He is concerned about diabetes because he has family history of it and stopped drinking sweet tea and soda, which he feels has helped somewhat. He endorses dry mouth or reduced sweating.  - Follow-up 12/13/2012:  He was started on neurontin 1092m(low dose due to patient having one kidney),   Neuropathy labs and EMG was normal.  - Follow-up 02/22/2012:  He continues to have burning feet and reports slight improvement in the intensity of the pain with neurontin.  He went up to neurontin 30059mast night.  [facility] his chronic low back pain and are discussing epidural injections.  MRI lumbar spine was recently approved by [employer] he is scheduled to have it later this month.  Medications:  Current Outpatient Prescriptions on File Prior to Visit  Medication Sig Dispense Refill  . Cholecalciferol (VITAMIN D3) 1000 UNITS CAPS Take 1 capsule by mouth daily.        . gMarland Kitchenbapentin (NEURONTIN) 100 MG capsule Take 300 mg by mouth at bedtime.       . Glucosamine-Chondroit-Vit C-Mn (GLUCOSAMINE 1500 COMPLEX PO) Take 1 capsule by mouth daily.        . lMarland Kitchenratadine (CLARITIN) 10 MG tablet Take 10 mg by mouth daily.        . Multiple Vitamin (MULTIVITAMIN) capsule Take 1 capsule by mouth daily.        . NMarland KitchenXIUM 40 MG capsule TAKE 1 CAPSULE BY MOUTH EVERY DAY   90 capsule  1  . vitamin C (ASCORBIC ACID) 500 MG tablet Take 240 mg by mouth daily.        No current facility-administered medications on file prior to visit.    Allergies:  Allergies  Allergen Reactions  . Latex      Review of Systems:  CONSTITUTIONAL: No fevers, chills, night sweats, or weight loss.   EYES: No visual changes or eye pain ENT: No hearing changes.  No history of nose bleeds.   RESPIRATORY: No cough, wheezing and shortness of breath.   CARDIOVASCULAR: Negative for chest pain, and palpitations.   GI: Negative for abdominal discomfort, blood in stools or black stools.  No recent change in bowel habits.   GU:  No history of incontinence.   MUSCLOSKELETAL: No history of joint pain or swelling.  No myalgias.   SKIN: Negative for lesions, rash, and itching.   ENDOCRINE: Negative for cold or heat intolerance, polydipsia or goiter.   PSYCH:  No depression or anxiety symptoms.   NEURO: As Above.   Vital Signs:  BP 110/80  Pulse 60  Temp(Src) 98.2 F (36.8 C)  Ht 6' 0.05" (1.83 m)  Wt 214 lb 1 oz (97.098 kg)  BMI 28.99 kg/m2  Neurological Exam: MENTAL STATUS including orientation to time, place, person, recent and remote memory, attention span and concentration,  language, and fund of knowledge is normal.  Speech is not dysarthric.  CRANIAL NERVES:  Pupils equal round and reactive to light.  Normal conjugate, extra-ocular eye movements in all directions of gaze.  Symmetric face.  MOTOR:  Motor strength is 5/5 throughout, including distally.  No atrophy, fasciculations or abnormal movements.  No pronator drift.  Tone is normal.    MSRs:  Right                                                                 Left brachioradialis 2+  brachioradialis 2+  biceps 2+  biceps 2+  triceps 2+  triceps 2+  patellar 3+  patellar 3+  ankle jerk 1+  ankle jerk 1+  Hoffman no  Hoffman no  plantar response up  plantar response up   SENSORY:  Normal and symmetric perception  of light touch, pinprick, vibration, and proprioception.   COORDINATION/GAIT:  Gait narrow based and stable.  Stressed gait intact.  Data: EMG 11/24/2012:  This is a normal electrodiagnostic study of the right lower extremity. There is no evidence of a lumbosacral radiculopathy or large fiber generalized sensorimotor polyneuropathy affecting the right lower extremity. However, a small fiber sensory neuropathy cannot be excluded by this study.   Labs 10/13/2012:  B12 708, ESR 1 Labs 11/15/2012:  TSH 1.16, copper 91, ceruloplasmin 27, 2hr GGT 100-172-97  IMPRESSION/PLAN: 1.  Painful paresthesias of the feet  - Clinically unchanged  - ?small fiber neuropathy vs S1 sensory radiculopathy  - Neuropathy labs and EMG is normal  - MRI lumbar spine to evaluate for S1 radiculopathy scheduled for 2/19  - Continue neurontin 331m qhs 2.  Return to clinic in 627-month  The duration of this appointment visit was 20 minutes of face-to-face time with the patient.  Greater than 50% of this time was spent in counseling, explanation of diagnosis, planning of further management, and coordination of care.   Thank you for allowing me to participate in patient's care.  If I can answer any additional questions, I would be pleased to do so.    Sincerely,    Donika K. PaPosey ProntoDO

## 2013-02-21 NOTE — Patient Instructions (Addendum)
1.  Continue neurontin 300mg  at bedtime 2.  I will see you back in 75-months

## 2013-03-01 ENCOUNTER — Telehealth: Payer: Self-pay | Admitting: Neurology

## 2013-03-01 NOTE — Telephone Encounter (Signed)
Left message for pt to call for clarification / Sherri S.

## 2013-03-01 NOTE — Telephone Encounter (Signed)
Message copied by Cindra Eves on Wed Mar 01, 2013 11:19 AM ------      Message from: Narda Amber K      Created: Tue Feb 21, 2013  5:11 PM       Sherri, pt had mentioned workman's compensation is involved in his case, but he had secondary insurance.  Can you clarify if this is WC vs private insurance? Thanks. ------

## 2013-03-03 ENCOUNTER — Other Ambulatory Visit: Payer: Self-pay

## 2013-03-03 MED ORDER — GABAPENTIN 100 MG PO CAPS: 300.0000 mg | ORAL_CAPSULE | Freq: Every day | ORAL | Status: DC

## 2013-03-28 NOTE — Telephone Encounter (Signed)
Pt never returned my call regarding insurance clarification. I have made a note on the patient's next appt to clarify insurance coverage and make sure pt is aware that we do not file w/c / Sherri S.

## 2013-05-22 ENCOUNTER — Ambulatory Visit (INDEPENDENT_AMBULATORY_CARE_PROVIDER_SITE_OTHER): Payer: BC Managed Care – PPO | Admitting: Family Medicine

## 2013-05-22 ENCOUNTER — Encounter: Payer: Self-pay | Admitting: Family Medicine

## 2013-05-22 VITALS — BP 120/82 | Temp 98.2°F | Ht 71.0 in | Wt 216.5 lb

## 2013-05-22 DIAGNOSIS — G609 Hereditary and idiopathic neuropathy, unspecified: Secondary | ICD-10-CM

## 2013-05-22 DIAGNOSIS — J019 Acute sinusitis, unspecified: Secondary | ICD-10-CM

## 2013-05-22 MED ORDER — LEVOFLOXACIN 500 MG PO TABS: 500.0000 mg | ORAL_TABLET | Freq: Every day | ORAL | Status: DC

## 2013-05-22 NOTE — Progress Notes (Signed)
   Subjective:    Patient ID: Phillip Mcguire, male    DOB: Jan 16, 1955, 58 y.o.   MRN: [id number]  Sinusitis This is a new problem. The current episode started 1 to 4 weeks ago. The problem is unchanged. The pain is moderate. Associated symptoms include congestion, coughing, headaches, shortness of breath and sinus pressure. Pertinent negatives include no diaphoresis or ear pain. Past treatments include oral decongestants. The treatment provided no relief.  No other concerns at this time.     Review of Systems  Constitutional: Negative for fever, diaphoresis and activity change.  HENT: Positive for congestion, rhinorrhea and sinus pressure. Negative for ear pain.   Eyes: Negative for discharge.  Respiratory: Positive for cough and shortness of breath. Negative for wheezing.   Cardiovascular: Negative for chest pain.  Neurological: Positive for headaches.       Objective:   Physical Exam  Nursing note and vitals reviewed. Constitutional: He appears well-developed.  HENT:  Head: Normocephalic.  Mouth/Throat: Oropharynx is clear and moist. No oropharyngeal exudate.  Neck: Normal range of motion.  Cardiovascular: Normal rate, regular rhythm and normal heart sounds.   No murmur heard. Pulmonary/Chest: Effort normal and breath sounds normal. He has no wheezes.  Lymphadenopathy:    He has no cervical adenopathy.  Neurological: He exhibits normal muscle tone.  Skin: Skin is warm and dry.      Lyrica- discussed    Assessment & Plan:  1. Acute sinus infection Antibiotics prescribed start off viral illness did not go away over 2 weeks treated with antibiotics warning signs discussed followup if problems  2. Unspecified hereditary and idiopathic peripheral neuropathy Certainly could try Lyrica nerve conduction study was negative but this does not pick up small fiber issues it is possible there could be underlying issue they're currently working with his lumbar area doing injections  but if that doesn't help things possibly Lyrica could he is to followup for a wellness exam by June or July

## 2013-05-29 ENCOUNTER — Other Ambulatory Visit: Payer: Self-pay | Admitting: Family Medicine

## 2013-05-31 ENCOUNTER — Other Ambulatory Visit: Payer: Self-pay | Admitting: Family Medicine

## 2013-05-31 ENCOUNTER — Telehealth: Payer: Self-pay | Admitting: Family Medicine

## 2013-05-31 MED ORDER — CEFDINIR 300 MG PO CAPS: 300.0000 mg | ORAL_CAPSULE | Freq: Two times a day (BID) | ORAL | Status: DC

## 2013-05-31 NOTE — Telephone Encounter (Signed)
Was diagnosed with sinus infection and prescribed Levaquin.

## 2013-05-31 NOTE — Telephone Encounter (Signed)
Med sent to pharm. Pt notified on voicemail.  

## 2013-05-31 NOTE — Telephone Encounter (Signed)
Patient was seen 5/11 and still not feeling any better. Can you call in another antibotic to [facility].

## 2013-05-31 NOTE — Addendum Note (Signed)
Addended by: Carmelina Noun on: 05/31/2013 04:07 PM   Modules accepted: Orders

## 2013-05-31 NOTE — Telephone Encounter (Signed)
omnicef 300 bid ten d 

## 2013-06-28 ENCOUNTER — Other Ambulatory Visit: Payer: Self-pay | Admitting: Family Medicine

## 2013-07-10 ENCOUNTER — Ambulatory Visit (INDEPENDENT_AMBULATORY_CARE_PROVIDER_SITE_OTHER): Payer: BC Managed Care – PPO | Admitting: Family Medicine

## 2013-07-10 ENCOUNTER — Encounter: Payer: Self-pay | Admitting: Family Medicine

## 2013-07-10 VITALS — BP 126/72 | Ht 70.5 in | Wt 216.2 lb

## 2013-07-10 DIAGNOSIS — E785 Hyperlipidemia, unspecified: Secondary | ICD-10-CM

## 2013-07-10 DIAGNOSIS — R7303 Prediabetes: Secondary | ICD-10-CM

## 2013-07-10 DIAGNOSIS — Z23 Encounter for immunization: Secondary | ICD-10-CM

## 2013-07-10 DIAGNOSIS — Z0000 Encounter for general adult medical examination without abnormal findings: Secondary | ICD-10-CM

## 2013-07-10 DIAGNOSIS — Z125 Encounter for screening for malignant neoplasm of prostate: Secondary | ICD-10-CM

## 2013-07-10 DIAGNOSIS — R7309 Other abnormal glucose: Secondary | ICD-10-CM

## 2013-07-10 NOTE — Progress Notes (Signed)
   Subjective:    Patient ID: Phillip Mcguire, male    DOB: 1955-09-09, 58 y.o.   MRN: [id number]  HPI The patient comes in today for a wellness visit.    A review of their health history was completed.  A review of medications was also completed.  Any needed refills; none  Eating habits: good  Falls/  MVA accidents in past few months: none  Regular exercise: normal  Specialist pt sees on regular basis: Dr. Berenice Primas, Dr. Jacelyn Grip  Preventative health issues were discussed.   Additional concerns: Patient has a possible mole on his buttocks that has been present for about 2 weeks now.    Review of Systems  Constitutional: Negative for fever, activity change and appetite change.  HENT: Negative for congestion and rhinorrhea.   Eyes: Negative for discharge.  Respiratory: Negative for cough and wheezing.   Cardiovascular: Negative for chest pain.  Gastrointestinal: Negative for vomiting, abdominal pain and blood in stool.  Genitourinary: Negative for frequency and difficulty urinating.  Musculoskeletal: Negative for neck pain.  Skin: Negative for rash.  Allergic/Immunologic: Negative for environmental allergies and food allergies.  Neurological: Negative for weakness and headaches.  Psychiatric/Behavioral: Negative for agitation.       Objective:   Physical Exam  Constitutional: He appears well-developed and well-nourished.  HENT:  Head: Normocephalic and atraumatic.  Right Ear: External ear normal.  Left Ear: External ear normal.  Nose: Nose normal.  Mouth/Throat: Oropharynx is clear and moist.  Eyes: EOM are normal. Pupils are equal, round, and reactive to light.  Neck: Normal range of motion. Neck supple. No thyromegaly present.  Cardiovascular: Normal rate, regular rhythm and normal heart sounds.   No murmur heard. Pulmonary/Chest: Effort normal and breath sounds normal. No respiratory distress. He has no wheezes.  Abdominal: Soft. Bowel sounds are normal. He exhibits  no distension and no mass. There is no tenderness.  Genitourinary: Penis normal.  Musculoskeletal: Normal range of motion. He exhibits no edema.  Lymphadenopathy:    He has no cervical adenopathy.  Neurological: He is alert. He exhibits normal muscle tone.  Skin: Skin is warm and dry. No erythema.  He does have a benign mole on the right buttock but there is no sign of any other particular problems. Other moles are normal for his age  Psychiatric: He has a normal mood and affect. His behavior is normal. Judgment normal.          Assessment & Plan:  Wellness-safety measures dietary measures all discussed. He will need colonoscopy in 2017. Overall energy level doing okay. Dietary measures doing good. Labs ordered. Await the results. Tetanus shot today. The mole on his buttock is normal. If it bothers him a lot he will go see dermatology to have it removed

## 2013-07-24 ENCOUNTER — Other Ambulatory Visit: Payer: Self-pay | Admitting: Family Medicine

## 2013-07-24 LAB — LIPID PANEL
CHOL/HDL RATIO: 4.1 ratio
CHOLESTEROL: 186 mg/dL (ref 0–200)
HDL: 45 mg/dL (ref >39)
LDL Cholesterol: 116 mg/dL — ABNORMAL HIGH (ref 0–99)
TRIGLYCERIDES: 123 mg/dL (ref <150)
VLDL: 25 mg/dL (ref 0–40)

## 2013-07-24 LAB — HEMOGLOBIN A1C
HEMOGLOBIN A1C: 5.4 % (ref <5.7)
MEAN PLASMA GLUCOSE: 108 mg/dL (ref <117)

## 2013-07-25 ENCOUNTER — Encounter: Payer: Self-pay | Admitting: Family Medicine

## 2013-07-25 LAB — PSA: PSA: 1.07 ng/mL (ref <=4.00)

## 2013-08-25 ENCOUNTER — Ambulatory Visit: Payer: BC Managed Care – PPO | Admitting: Neurology

## 2013-08-28 ENCOUNTER — Other Ambulatory Visit (HOSPITAL_COMMUNITY): Payer: Self-pay | Admitting: Orthopedic Surgery

## 2013-08-28 DIAGNOSIS — M75102 Unspecified rotator cuff tear or rupture of left shoulder, not specified as traumatic: Secondary | ICD-10-CM

## 2013-08-31 ENCOUNTER — Ambulatory Visit (HOSPITAL_COMMUNITY)
Admission: RE | Admit: 2013-08-31 | Discharge: 2013-08-31 | Disposition: A | Discharge status: Home / Self Care | Payer: Worker's Compensation | Source: Ambulatory Visit | Attending: Orthopedic Surgery | Admitting: Orthopedic Surgery

## 2013-08-31 DIAGNOSIS — Z9889 Other specified postprocedural states: Secondary | ICD-10-CM | POA: Diagnosis present

## 2013-08-31 DIAGNOSIS — M75102 Unspecified rotator cuff tear or rupture of left shoulder, not specified as traumatic: Secondary | ICD-10-CM

## 2013-08-31 DIAGNOSIS — M25519 Pain in unspecified shoulder: Secondary | ICD-10-CM | POA: Diagnosis not present

## 2013-08-31 DIAGNOSIS — S43429A Sprain of unspecified rotator cuff capsule, initial encounter: Secondary | ICD-10-CM | POA: Diagnosis present

## 2013-09-05 ENCOUNTER — Ambulatory Visit: Payer: BC Managed Care – PPO | Admitting: Neurology

## 2013-09-07 ENCOUNTER — Telehealth: Payer: Self-pay | Admitting: Neurology

## 2013-09-07 NOTE — Telephone Encounter (Signed)
Pt no showed 09/05/13 appt with Dr. Posey Pronto.  Melissa Noon - please send no show letter to pt / Sherri

## 2013-09-11 ENCOUNTER — Encounter: Payer: Self-pay | Admitting: *Deleted

## 2013-09-11 NOTE — Progress Notes (Signed)
No show letter sent for 09/05/2013

## 2013-12-22 ENCOUNTER — Other Ambulatory Visit (HOSPITAL_COMMUNITY): Payer: Self-pay | Admitting: Orthopedic Surgery

## 2013-12-22 ENCOUNTER — Ambulatory Visit (HOSPITAL_COMMUNITY)
Admission: RE | Admit: 2013-12-22 | Discharge: 2013-12-22 | Disposition: A | Discharge status: Home / Self Care | Payer: BC Managed Care – PPO | Source: Ambulatory Visit | Attending: Orthopedic Surgery | Admitting: Orthopedic Surgery

## 2013-12-22 DIAGNOSIS — M542 Cervicalgia: Secondary | ICD-10-CM

## 2013-12-22 DIAGNOSIS — Z01818 Encounter for other preprocedural examination: Secondary | ICD-10-CM | POA: Diagnosis not present

## 2014-01-03 ENCOUNTER — Other Ambulatory Visit: Payer: Self-pay | Admitting: Family Medicine

## 2014-03-21 ENCOUNTER — Telehealth: Payer: Self-pay | Admitting: Family Medicine

## 2014-03-21 NOTE — Telephone Encounter (Signed)
Pt called requesting referral to Dr. Earlean Shawl for reflux. Not seen here since 07/10/13 & that was a wellness visit, if "ok" please initiate referral in system so that I may process (pt prefers late PM appt due to work schedule)

## 2014-03-22 NOTE — Telephone Encounter (Signed)
Discussed with patient. Patient scheduled office visit to discuss. 

## 2014-03-22 NOTE — Telephone Encounter (Signed)
I need to see first, then we can refer ( helps referral process also allows early testing if needed and documents referral for both the referral doctor and insurance) fine to give appt for next week

## 2014-04-20 ENCOUNTER — Ambulatory Visit (INDEPENDENT_AMBULATORY_CARE_PROVIDER_SITE_OTHER): Payer: BLUE CROSS/BLUE SHIELD | Admitting: Family Medicine

## 2014-04-20 ENCOUNTER — Encounter: Payer: Self-pay | Admitting: Family Medicine

## 2014-04-20 VITALS — BP 134/88 | Ht 70.5 in | Wt 227.0 lb

## 2014-04-20 DIAGNOSIS — R1314 Dysphagia, pharyngoesophageal phase: Secondary | ICD-10-CM

## 2014-04-20 DIAGNOSIS — G8929 Other chronic pain: Secondary | ICD-10-CM

## 2014-04-20 DIAGNOSIS — Q614 Renal dysplasia: Secondary | ICD-10-CM

## 2014-04-20 DIAGNOSIS — K219 Gastro-esophageal reflux disease without esophagitis: Secondary | ICD-10-CM | POA: Diagnosis not present

## 2014-04-20 DIAGNOSIS — M545 Low back pain, unspecified: Secondary | ICD-10-CM | POA: Insufficient documentation

## 2014-04-20 MED ORDER — ESOMEPRAZOLE MAGNESIUM 40 MG PO CPDR: 40.0000 mg | DELAYED_RELEASE_CAPSULE | Freq: Every day | ORAL | Status: DC

## 2014-04-20 NOTE — Patient Instructions (Signed)

## 2014-04-20 NOTE — Progress Notes (Signed)
   Subjective:    Patient ID: Phillip Mcguire, male    DOB: 05/31/1955, 59 y.o.   MRN: [id number]  HPI Patient is here today d/t acid reflux.  He has been on Nexium for 20 years. He has been hearing lately bad long term effects of it. We talked at length about this. All PPIs run the risk of osteopenia he is worried about dementia I told him that he is in a rock and a hard place that with his reflux symptoms and early dysphagia that he cannot stop the medication currently he needs to go ahead and be seen by GI  Pt wants a referral to GI so that he can get an endoscopy and see how bad his reflux is. Wants to ween himself off.  Also c/o of having a "red face". We talked at length about this. I believe the patient is having some early acne rosacea but it's not severe enough to start medication at this point if it worsens I would recommend this      Review of Systems  he denies hematuria denies hematochezia denies hematemesis. Denies abdominal pain does relate intermittent reflux also relates slight dysphagia denies weight loss    Objective:   Physical Exam  neck supple no masses lungs are clear hearts regular abdomen soft no uarding or rebound  minimal telangiectasias areas on the nose      Assessment & Plan:   mild dysphagia referral to him  For GI consultation will need endoscopy possible stretching continue PPI watch diet closely   patient has one kidney his orthopedist put him on anti-inflammatory for a work-related problem I told the patient do not use more than a month also told the need to check metabolic 7 along with protein creatinine ratio blood pressure looks good today follow-up within 6 months   he will discuss with the gastroenterologist at the [facility] to stay on the PPI long-term

## 2014-04-25 LAB — PROTEIN / CREATININE RATIO, URINE
CREATININE, UR: 50.5 mg/dL (ref 22.0–328.0)
Protein/Creat Ratio: <79 mg/g creat (ref 0–200)

## 2014-04-25 LAB — BASIC METABOLIC PANEL
BUN / CREAT RATIO: 23 — AB (ref 9–20)
BUN: 21 mg/dL (ref 6–24)
CHLORIDE: 99 mmol/L (ref 97–108)
CO2: 25 mmol/L (ref 18–29)
Calcium: 9.4 mg/dL (ref 8.7–10.2)
Creatinine, Ser: 0.91 mg/dL (ref 0.76–1.27)
GFR calc Af Amer: 107 mL/min/{1.73_m2} (ref >59)
GFR calc non Af Amer: 93 mL/min/{1.73_m2} (ref >59)
Glucose: 99 mg/dL (ref 65–99)
POTASSIUM: 3.9 mmol/L (ref 3.5–5.2)
SODIUM: 140 mmol/L (ref 134–144)

## 2014-04-27 ENCOUNTER — Encounter: Payer: Self-pay | Admitting: Family Medicine

## 2014-06-10 ENCOUNTER — Encounter: Payer: Self-pay | Admitting: Family Medicine

## 2014-06-10 DIAGNOSIS — K219 Gastro-esophageal reflux disease without esophagitis: Secondary | ICD-10-CM | POA: Insufficient documentation

## 2014-06-20 ENCOUNTER — Ambulatory Visit (INDEPENDENT_AMBULATORY_CARE_PROVIDER_SITE_OTHER): Payer: BLUE CROSS/BLUE SHIELD | Admitting: Family Medicine

## 2014-06-20 ENCOUNTER — Encounter: Payer: Self-pay | Admitting: Family Medicine

## 2014-06-20 VITALS — BP 108/64 | Temp 97.4°F | Ht 71.0 in | Wt 220.0 lb

## 2014-06-20 DIAGNOSIS — J0100 Acute maxillary sinusitis, unspecified: Secondary | ICD-10-CM | POA: Diagnosis not present

## 2014-06-20 MED ORDER — HYDROCODONE-HOMATROPINE 5-1.5 MG/5ML PO SYRP: 5.0000 mL | ORAL_SOLUTION | Freq: Every evening | ORAL | Status: DC | PRN

## 2014-06-20 MED ORDER — CLARITHROMYCIN 500 MG PO TABS: 500.0000 mg | ORAL_TABLET | Freq: Two times a day (BID) | ORAL | Status: AC

## 2014-06-20 NOTE — Progress Notes (Signed)
   Subjective:    Patient ID: Phillip Mcguire, male    DOB: 21-Sep-1955, 59 y.o.   MRN: [id number]  Cough This is a new problem. Episode onset: 3 days. Associated symptoms include headaches, nasal congestion, a sore throat and wheezing. Treatments tried: OTC meds.   Scratch y throat and sneezing  Fairly impressive frontal headache   Clear still   Feeling progression in to the chest  No know  Exposure  Wheezy at times, bad cough last night    Review of Systems  HENT: Positive for sore throat.   Respiratory: Positive for cough and wheezing.   Neurological: Positive for headaches.       Objective:   Physical Exam Alert. Moderate malaise. H&T moderate nasal congestion frontal tenderness. Pharynx normal lungs bronchial cough heart regular in rhythm.       Assessment & Plan:  Impression acute rhinosinusitis/bronchitis plan anti-bites prescribed. Symptom care discussed. Hycodan daily at bedtime when necessary. WSL

## 2014-07-06 ENCOUNTER — Ambulatory Visit (INDEPENDENT_AMBULATORY_CARE_PROVIDER_SITE_OTHER): Payer: BLUE CROSS/BLUE SHIELD | Admitting: Family Medicine

## 2014-07-06 ENCOUNTER — Encounter: Payer: Self-pay | Admitting: Family Medicine

## 2014-07-06 VITALS — BP 110/74 | Temp 98.1°F | Ht 71.0 in | Wt 220.4 lb

## 2014-07-06 DIAGNOSIS — J019 Acute sinusitis, unspecified: Secondary | ICD-10-CM | POA: Diagnosis not present

## 2014-07-06 DIAGNOSIS — B9689 Other specified bacterial agents as the cause of diseases classified elsewhere: Secondary | ICD-10-CM

## 2014-07-06 MED ORDER — LEVOFLOXACIN 500 MG PO TABS: 500.0000 mg | ORAL_TABLET | Freq: Every day | ORAL | Status: DC

## 2014-07-06 NOTE — Progress Notes (Addendum)
   Subjective:    Patient ID: Phillip Mcguire, male    DOB: Nov 07, 1955, 59 y.o.   MRN: [id number]  Cough This is a new problem. The current episode started 1 to 4 weeks ago. The problem has been unchanged. Associated symptoms include rhinorrhea. Pertinent negatives include no chest pain, ear pain, fever or wheezing.   Patient with significant head congestion drainage coughing was treated once now low-dose with sinus pressure pain discomfort   Review of Systems  Constitutional: Negative for fever and activity change.  HENT: Positive for congestion and rhinorrhea. Negative for ear pain.   Eyes: Negative for discharge.  Respiratory: Positive for cough. Negative for wheezing.   Cardiovascular: Negative for chest pain.       Objective:   Physical Exam  Constitutional: He appears well-developed.  HENT:  Head: Normocephalic.  Mouth/Throat: Oropharynx is clear and moist. No oropharyngeal exudate.  Neck: Normal range of motion.  Cardiovascular: Normal rate, regular rhythm and normal heart sounds.   No murmur heard. Pulmonary/Chest: Effort normal and breath sounds normal. He has no wheezes.  Lymphadenopathy:    He has no cervical adenopathy.  Neurological: He exhibits normal muscle tone.  Skin: Skin is warm and dry.  Nursing note and vitals reviewed.         Assessment & Plan:  Viral syndrome Secondary sinusitis Antibiotics* Warning signs discussed follow-up if ongoing troubles  CPAP machine blower broken beyond repair Patient benefits from daily CPAP usage

## 2014-08-30 ENCOUNTER — Telehealth: Payer: Self-pay | Admitting: Family Medicine

## 2014-08-30 NOTE — Telephone Encounter (Signed)
Pt requesting a Rx for a new CPAP machine, tubing, mask of choice, & heated humidifier.  He wants to change DME companies and needs a new order.  He will pick up order.  Please advise

## 2014-08-31 NOTE — Telephone Encounter (Signed)
Nurses, he may have order for this, this can be signed for. But necessary to find out from the patient the pressure of the CPAP he is using if he knows it. Also the patient will need a copy of sleep study-please locate this (possibly in the electronics, possibly in the paper chart) if possible provide a copy of this with the order. Patient should be aware it is possible that the new provider of this equipment may require a up-to-date titration study. Finally diagnosis to put on CPAP is sleep apnea

## 2014-09-05 ENCOUNTER — Ambulatory Visit (INDEPENDENT_AMBULATORY_CARE_PROVIDER_SITE_OTHER): Payer: BLUE CROSS/BLUE SHIELD | Admitting: Family Medicine

## 2014-09-05 ENCOUNTER — Encounter: Payer: Self-pay | Admitting: Family Medicine

## 2014-09-05 VITALS — BP 120/88 | Temp 98.8°F | Ht 71.0 in | Wt 225.4 lb

## 2014-09-05 DIAGNOSIS — J0100 Acute maxillary sinusitis, unspecified: Secondary | ICD-10-CM | POA: Diagnosis not present

## 2014-09-05 MED ORDER — LEVOFLOXACIN 500 MG PO TABS: 500.0000 mg | ORAL_TABLET | Freq: Every day | ORAL | Status: AC

## 2014-09-05 NOTE — Progress Notes (Signed)
   Subjective:    Patient ID: Phillip Mcguire, male    DOB: 1955/09/26, 59 y.o.   MRN: [id number]  Sinus Problem This is a new problem. The current episode started in the past 7 days (2 days). Associated symptoms include congestion and headaches. Treatments tried: OTC meds.   Bad sneezing used nose spray and otc meds  Off and on symptoms  Headache frontal  No smoker  Headache frontal left eye watering  Little cough  Drainage some days s   Review of Systems  HENT: Positive for congestion.   Neurological: Positive for headaches.       Objective:   Physical Exam  Alert no acute distress. H&T moderate his congestion frontal tenderness pharynx slight erythema neck supple. Lungs clear. Heart regular in rhythm.      Assessment & Plan:  Impression 1 rhinosinusitis plan antibiotics prescribed. Levaquin daily 10 days. Patient states this has worked best for him. Symptomatic care discussed WSL

## 2014-09-10 ENCOUNTER — Telehealth: Payer: Self-pay | Admitting: Family Medicine

## 2014-09-10 NOTE — Telephone Encounter (Signed)
Form for CPAP was filled out and signed. Apparently they do need copies of last office visit within the past 6 months and a copy of the patient's last sleep study please track this down and provide. Once you find it-medical records may need to print it.

## 2014-09-11 NOTE — Telephone Encounter (Signed)
Apnea monitor/sleep study is under other orders

## 2015-01-12 ENCOUNTER — Other Ambulatory Visit: Payer: Self-pay | Admitting: Family Medicine

## 2015-01-24 ENCOUNTER — Encounter: Payer: Self-pay | Admitting: Family Medicine

## 2015-01-24 ENCOUNTER — Ambulatory Visit (INDEPENDENT_AMBULATORY_CARE_PROVIDER_SITE_OTHER): Payer: BLUE CROSS/BLUE SHIELD | Admitting: Family Medicine

## 2015-01-24 VITALS — BP 114/76 | Ht 71.0 in | Wt 217.0 lb

## 2015-01-24 DIAGNOSIS — E785 Hyperlipidemia, unspecified: Secondary | ICD-10-CM | POA: Diagnosis not present

## 2015-01-24 DIAGNOSIS — Z125 Encounter for screening for malignant neoplasm of prostate: Secondary | ICD-10-CM

## 2015-01-24 DIAGNOSIS — Z79899 Other long term (current) drug therapy: Secondary | ICD-10-CM | POA: Diagnosis not present

## 2015-01-24 DIAGNOSIS — Z0000 Encounter for general adult medical examination without abnormal findings: Secondary | ICD-10-CM | POA: Diagnosis not present

## 2015-01-24 DIAGNOSIS — R739 Hyperglycemia, unspecified: Secondary | ICD-10-CM

## 2015-01-24 NOTE — Progress Notes (Signed)
   Subjective:    Patient ID: Phillip Mcguire, male    DOB: [birth year], 60 y.o.   MRN: [id number]  HPI The patient comes in today for a wellness visit.    A review of their health history was completed.  A review of medications was also completed.  Any needed refills: n/a  Eating habits: good  Falls/  MVA accidents in past few months: none  Regular exercise: yes  Specialist pt sees on regular basis: none  Preventative health issues were discussed.   Additional concerns: using CPAP machine  Colonoscopy- 01/12/2005  Review of Systems  Constitutional: Negative for fever, activity change and appetite change.  HENT: Negative for congestion and rhinorrhea.   Eyes: Negative for discharge.  Respiratory: Negative for cough and wheezing.   Cardiovascular: Negative for chest pain.  Gastrointestinal: Negative for vomiting, abdominal pain and blood in stool.  Genitourinary: Negative for frequency and difficulty urinating.  Musculoskeletal: Negative for neck pain.  Skin: Negative for rash.  Allergic/Immunologic: Negative for environmental allergies and food allergies.  Neurological: Negative for weakness and headaches.  Psychiatric/Behavioral: Negative for agitation.       Objective:   Physical Exam  Constitutional: He appears well-developed and well-nourished.  HENT:  Head: Normocephalic and atraumatic.  Right Ear: External ear normal.  Left Ear: External ear normal.  Nose: Nose normal.  Mouth/Throat: Oropharynx is clear and moist.  Eyes: EOM are normal. Pupils are equal, round, and reactive to light.  Neck: Normal range of motion. Neck supple. No thyromegaly present.  Cardiovascular: Normal rate, regular rhythm and normal heart sounds.   No murmur heard. Pulmonary/Chest: Effort normal and breath sounds normal. No respiratory distress. He has no wheezes.  Abdominal: Soft. Bowel sounds are normal. He exhibits no distension and no mass. There is no tenderness.  Genitourinary:  Penis normal.  Musculoskeletal: Normal range of motion. He exhibits no edema.  Lymphadenopathy:    He has no cervical adenopathy.  Neurological: He is alert. He exhibits normal muscle tone.  Skin: Skin is warm and dry. No erythema.  Psychiatric: He has a normal mood and affect. His behavior is normal. Judgment normal.     patient due for colonoscopy.      Assessment & Plan:   safety dietary discussed. Patient will set up colonoscopy through his gastroenterologist if he needs referral he will let us know his lab work  Was ordered.   patient using CPAP machine wonders if it could be causing ear infections I have never heard of this he will discuss this with his ENT   shingles vaccine recommended at age 43.

## 2015-01-25 LAB — HEPATIC FUNCTION PANEL
ALBUMIN: 4.6 g/dL (ref 3.5–5.5)
ALT: 30 IU/L (ref 0–44)
AST: 23 IU/L (ref 0–40)
Alkaline Phosphatase: 85 IU/L (ref 39–117)
Bilirubin Total: 1.3 mg/dL — ABNORMAL HIGH (ref 0.0–1.2)
Bilirubin, Direct: 0.23 mg/dL (ref 0.00–0.40)
Total Protein: 7.4 g/dL (ref 6.0–8.5)

## 2015-01-25 LAB — HEMOGLOBIN A1C
ESTIMATED AVERAGE GLUCOSE: 114 mg/dL
HEMOGLOBIN A1C: 5.6 % (ref 4.8–5.6)

## 2015-01-25 LAB — BASIC METABOLIC PANEL
BUN/Creatinine Ratio: 17 (ref 9–20)
BUN: 17 mg/dL (ref 6–24)
CALCIUM: 10 mg/dL (ref 8.7–10.2)
CHLORIDE: 97 mmol/L (ref 96–106)
CO2: 26 mmol/L (ref 18–29)
Creatinine, Ser: 1.01 mg/dL (ref 0.76–1.27)
GFR calc Af Amer: 94 mL/min/{1.73_m2} (ref >59)
GFR calc non Af Amer: 81 mL/min/{1.73_m2} (ref >59)
Glucose: 91 mg/dL (ref 65–99)
POTASSIUM: 4.8 mmol/L (ref 3.5–5.2)
Sodium: 140 mmol/L (ref 134–144)

## 2015-01-25 LAB — PSA: Prostate Specific Ag, Serum: 1.6 ng/mL (ref 0.0–4.0)

## 2015-01-25 LAB — LIPID PANEL
CHOL/HDL RATIO: 4.7 ratio (ref 0.0–5.0)
CHOLESTEROL TOTAL: 237 mg/dL — AB (ref 100–199)
HDL: 50 mg/dL (ref >39)
LDL CALC: 163 mg/dL — AB (ref 0–99)
Triglycerides: 120 mg/dL (ref 0–149)
VLDL CHOLESTEROL CAL: 24 mg/dL (ref 5–40)

## 2015-01-29 ENCOUNTER — Other Ambulatory Visit: Payer: Self-pay

## 2015-01-29 DIAGNOSIS — E785 Hyperlipidemia, unspecified: Secondary | ICD-10-CM

## 2015-03-04 ENCOUNTER — Telehealth: Payer: Self-pay | Admitting: Family Medicine

## 2015-03-04 NOTE — Telephone Encounter (Signed)
Lipid is the only test. It is already in the system.

## 2015-03-04 NOTE — Telephone Encounter (Signed)
Orders already put in for lipid. Does he need anything else

## 2015-03-04 NOTE — Telephone Encounter (Signed)
Patient has appointment 3/1 and would like to get his labs done before his appointment.He wants to get them done tomorrow if possible.

## 2015-03-04 NOTE — Telephone Encounter (Signed)
Pt.notified

## 2015-03-06 LAB — LIPID PANEL
CHOL/HDL RATIO: 4.1 ratio (ref 0.0–5.0)
Cholesterol, Total: 196 mg/dL (ref 100–199)
HDL: 48 mg/dL (ref >39)
LDL Calculated: 131 mg/dL — ABNORMAL HIGH (ref 0–99)
Triglycerides: 86 mg/dL (ref 0–149)
VLDL Cholesterol Cal: 17 mg/dL (ref 5–40)

## 2015-03-11 DIAGNOSIS — H6983 Other specified disorders of Eustachian tube, bilateral: Secondary | ICD-10-CM | POA: Insufficient documentation

## 2015-03-11 DIAGNOSIS — H6533 Chronic mucoid otitis media, bilateral: Secondary | ICD-10-CM | POA: Insufficient documentation

## 2015-03-13 ENCOUNTER — Encounter: Payer: Self-pay | Admitting: Family Medicine

## 2015-03-13 ENCOUNTER — Ambulatory Visit (INDEPENDENT_AMBULATORY_CARE_PROVIDER_SITE_OTHER): Payer: BLUE CROSS/BLUE SHIELD | Admitting: Family Medicine

## 2015-03-13 VITALS — BP 114/72 | Temp 98.2°F | Ht 71.0 in | Wt 217.0 lb

## 2015-03-13 DIAGNOSIS — E785 Hyperlipidemia, unspecified: Secondary | ICD-10-CM

## 2015-03-13 DIAGNOSIS — B9789 Other viral agents as the cause of diseases classified elsewhere: Secondary | ICD-10-CM

## 2015-03-13 DIAGNOSIS — J069 Acute upper respiratory infection, unspecified: Secondary | ICD-10-CM

## 2015-03-13 MED ORDER — HYDROCODONE-HOMATROPINE 5-1.5 MG/5ML PO SYRP: 5.0000 mL | ORAL_SOLUTION | Freq: Four times a day (QID) | ORAL | Status: DC | PRN

## 2015-03-13 NOTE — Progress Notes (Signed)
   Subjective:    Patient ID: Phillip Mcguire, male    DOB: 07/27/1955, 60 y.o.   MRN: [id number]  Hyperlipidemia This is a new problem. Pertinent negatives include no chest pain. Current antihyperlipidemic treatment includes exercise and diet change. There are no compliance problems.    Cough, sinus drainage. Started 2 days ago.  Patient is already on an antibiotic for his ear infection being followed by ear specialist He is trying to stay physically active watch how he eats and be healthier recent lab work reviewed with patient  Review of Systems  Constitutional: Negative for fever and activity change.  HENT: Positive for congestion and rhinorrhea. Negative for ear pain.   Eyes: Negative for discharge.  Respiratory: Positive for cough. Negative for wheezing.   Cardiovascular: Negative for chest pain.       Objective:   Physical Exam  Constitutional: He appears well-developed.  HENT:  Head: Normocephalic.  Mouth/Throat: Oropharynx is clear and moist. No oropharyngeal exudate.  Neck: Normal range of motion.  Cardiovascular: Normal rate, regular rhythm and normal heart sounds.   No murmur heard. Pulmonary/Chest: Effort normal and breath sounds normal. He has no wheezes.  Lymphadenopathy:    He has no cervical adenopathy.  Neurological: He exhibits normal muscle tone.  Skin: Skin is warm and dry.  Nursing note and vitals reviewed.         Assessment & Plan:  Hyperlipidemia-improvement in how he was doing I encouraged patient to continue to eat healthy regular physical activity consider repeating cholesterol profile again in 6 months if not definitely repeated again at the time of his wellness. I would not recommend statins currently. His risk ratio brings him under a 7% 10 year risk. Certainly if this worsens he will need to be on a statin.  Viral URI no sign of any type of sinus infection I don't recommend antibiotics at this point patient actually is already on antibiotic  for his ear patient was told further antibiotics would not help virus warning signs were discussed.

## 2015-03-25 DIAGNOSIS — J3089 Other allergic rhinitis: Secondary | ICD-10-CM | POA: Insufficient documentation

## 2015-03-25 DIAGNOSIS — J324 Chronic pansinusitis: Secondary | ICD-10-CM | POA: Insufficient documentation

## 2015-04-16 DIAGNOSIS — H60333 Swimmer's ear, bilateral: Secondary | ICD-10-CM | POA: Insufficient documentation

## 2015-05-24 ENCOUNTER — Encounter: Payer: Self-pay | Admitting: Family Medicine

## 2015-05-24 ENCOUNTER — Ambulatory Visit (INDEPENDENT_AMBULATORY_CARE_PROVIDER_SITE_OTHER): Payer: BLUE CROSS/BLUE SHIELD | Admitting: Family Medicine

## 2015-05-24 VITALS — BP 118/72 | Ht 71.0 in | Wt 211.0 lb

## 2015-05-24 DIAGNOSIS — R079 Chest pain, unspecified: Secondary | ICD-10-CM | POA: Diagnosis not present

## 2015-05-24 DIAGNOSIS — G5603 Carpal tunnel syndrome, bilateral upper limbs: Secondary | ICD-10-CM

## 2015-05-24 NOTE — Progress Notes (Signed)
   Subjective:    Patient ID: Phillip Mcguire, male    DOB: [birth year], 60 y.o.   MRN: [id number] Patient arrives office with 2 substantial symptom complexes Hand Pain  Incident onset: 3 weeks ago. There was no injury mechanism. Quality: numbness and tingling. Treatments tried: hand brace.  \ leftr hand somewhat numb and right hand   Worse with driving, hand goes to sleep   Patient also notes chest discomfort. Left sided. Pressure-like sensation. Last for a few minutes. Feels tigh going up steps  Patient does have some risk factors including hyperlipidemia   [phone]  Review of Systems No headache, no major weight loss or weight gain, no chest pain no back pain abdominal pain no change in bowel habits complete ROS otherwise negative     Objective:   Physical Exam Positive Phalen's sign positive Tinel sign right greater than left lungs clear heart rare rhythm no chest wall turns abdomen benign H&T normal next  EKG sinus bradycardia no significant ST-T changes       Assessment & Plan:  Impression 1 carpal tunnel syndrome discussed bilateral right greater than left has been using nighttime splint without success would like to take it to next #2 chest discomfort. Atypical in nature but with some risk factors discussed plan cardiology referral. Rationale discussed neurology referral rationale discussed WSL

## 2015-05-27 ENCOUNTER — Encounter: Payer: Self-pay | Admitting: Family Medicine

## 2015-06-03 ENCOUNTER — Ambulatory Visit (INDEPENDENT_AMBULATORY_CARE_PROVIDER_SITE_OTHER): Payer: BLUE CROSS/BLUE SHIELD | Admitting: Cardiovascular Disease

## 2015-06-03 ENCOUNTER — Encounter: Payer: Self-pay | Admitting: Cardiovascular Disease

## 2015-06-03 VITALS — BP 122/68 | HR 57 | Ht 71.0 in | Wt 207.0 lb

## 2015-06-03 DIAGNOSIS — R42 Dizziness and giddiness: Secondary | ICD-10-CM | POA: Diagnosis not present

## 2015-06-03 DIAGNOSIS — R079 Chest pain, unspecified: Secondary | ICD-10-CM

## 2015-06-03 NOTE — Patient Instructions (Signed)
Your physician recommends that you schedule a follow-up appointment in:  To be determined after test   Your physician has requested that you have an exercise tolerance test. For further information please visit [email or web address]. Please also follow instruction sheet, as given.    Your physician recommends that you continue on your current medications as directed. Please refer to the Current Medication list given to you today.      Thank you for choosing [facility] !

## 2015-06-03 NOTE — Progress Notes (Signed)
Patient ID: Phillip Mcguire, male   DOB: 04/13/1955, 60 y.o.   MRN: [id number]       CARDIOLOGY CONSULT NOTE  Patient ID: Phillip Mcguire MRN: [id number] DOB/AGE: 09/18/1955 60 y.o.  Admit date: (Not on file) Primary Physician: [name], MD Referring Physician:   Reason for Consultation: chest pain  HPI: The patient is a 60 year old male referred for the evaluation of chest pain. He has a history of GERD and hyperlipidemia.   Recent ECG performed by PCP which I personally interpreted demonstrates normal sinus rhythm with no ischemic ST segment or T-wave abnormalities, nor any arrhythmias.  He tells me the pain began a month ago and he woke up with it. He said "it feels like I pulled something in my chest" and describes it as a "dull sensation". It is made worse when he lifts objects greater than 50 pounds. He describes some mild aggravation of this chest discomfort when he walks up 2 flights of stairs. He has associated dizziness but denies shortness of breath. Also denies palpitations, leg swelling, orthopnea, syncope, and paroxysmal nocturnal dyspnea. Denies a history of smoking.  Family history: Father had MI in early 2s, mother received defibrillator in late 21s.  Lipids 03/05/15 total cholesterol 196, triglycerides 86, HDL 48, LDL 131.  Allergies  Allergen Reactions  . Latex     Current Outpatient Prescriptions  Medication Sig Dispense Refill  . Cholecalciferol (VITAMIN D3) 1000 UNITS CAPS Take 1 capsule by mouth daily.      Marland Kitchen esomeprazole (NEXIUM) 40 MG capsule Take 40 mg by mouth daily.    Marland Kitchen gabapentin (NEURONTIN) 100 MG capsule Take 100 mg by mouth 3 (three) times daily.    . Gluc-Chonn-MSM-Boswellia-Vit D (GLUCOSAMINE CHONDROITIN COMPLX) TABS Take by mouth.    . Glucosamine-Chondroit-Vit C-Mn (GLUCOSAMINE 1500 COMPLEX PO) Take 1 capsule by mouth daily.      . montelukast (SINGULAIR) 10 MG tablet Take 10 mg by mouth at bedtime.    . Multiple Vitamin (MULTIVITAMIN)  capsule Take 1 capsule by mouth daily.      . vitamin C (ASCORBIC ACID) 500 MG tablet Take 240 mg by mouth daily.      No current facility-administered medications for this visit.    Past Medical History  Diagnosis Date  . Hearing loss   . Abnormal LFTs     mild elevation of total, indirect and direct bilirubin  . Congenital absence of kidney     Right  . GERD (gastroesophageal reflux disease)   . Allergy   . Sleep apnea     Past Surgical History  Procedure Laterality Date  . Otosclerosis      bilateral ear surgery  . Nasal septum surgery  1980's  . Esophagogastroduodenoscopy  01/2002  . Colonoscopy  2007  . Titanium implant Left March 2010    left middle ear  . Shoulder arthroscopy w/ rotator cuff repair Left 2003    Social History   Social History  . Marital Status: Married    Spouse Name: N/A  . Number of Children: 1  . Years of Education: N/A   Occupational History  . 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     [place]   Social History Main Topics  . Smoking status: Former Smoker -- 1.50 packs/day for 15 years    Types: Cigarettes    Quit date: 01/13/1988  . Smokeless tobacco: Never Used  . Alcohol Use: 0.0 oz/week    0 Standard drinks or equivalent per week  Comment: rarely drink beer  . Drug Use: No  . Sexual Activity: Not on file   Other Topics Concern  . Not on file   Social History Narrative   He works in [employer], Energy manager   Lives with wife.  They have one child.     No family history of premature CAD in 1st degree relatives.  Prior to Admission medications   Medication Sig Start Date End Date Taking? Authorizing Provider  Cholecalciferol (VITAMIN D3) 1000 UNITS CAPS Take 1 capsule by mouth daily.      Historical Provider, MD  Glucosamine-Chondroit-Vit C-Mn (GLUCOSAMINE 1500 COMPLEX PO) Take 1 capsule by mouth daily.      Historical Provider, MD  loratadine (CLARITIN) 10 MG tablet Take 10 mg by mouth daily.      Historical  Provider, MD  Multiple Vitamin (MULTIVITAMIN) capsule Take 1 capsule by mouth daily.      Historical Provider, MD  OMEPRAZOLE PO Take by mouth.    Historical Provider, MD  vitamin C (ASCORBIC ACID) 500 MG tablet Take 240 mg by mouth daily.     Historical Provider, MD     Review of systems complete and found to be negative unless listed above in HPI     Physical exam Blood pressure 122/68, pulse 57, height 5\' 11"  (1.803 m), weight 207 lb (93.895 kg), SpO2 97 %. General: NAD Neck: No JVD, no thyromegaly or thyroid nodule.  Lungs: Clear to auscultation bilaterally with normal respiratory effort. CV: Nondisplaced PMI. Regular rate and rhythm, normal S1/S2, no S3/S4, no murmur.  No peripheral edema.  No carotid bruit.  Normal pedal pulses.  Abdomen: Soft, nontender, no hepatosplenomegaly, no distention.  Skin: Intact without lesions or rashes.  Neurologic: Alert and oriented x 3.  Psych: Normal affect. Extremities: No clubbing or cyanosis.  HEENT: Normal.   ECG: Most recent ECG reviewed.  Labs:   Lab Results  Component Value Date   WBC 8.4 10/13/2012   HGB 15.8 10/13/2012   HCT 45.3 10/13/2012   MCV 84.7 10/13/2012   PLT 272 10/13/2012   No results for input(s): NA, K, CL, CO2, BUN, CREATININE, CALCIUM, PROT, BILITOT, ALKPHOS, ALT, AST, GLUCOSE in the last 168 hours.  Invalid input(s): LABALBU No results found for: CKTOTAL, CKMB, CKMBINDEX, TROPONINI  Lab Results  Component Value Date   CHOL 196 03/05/2015   CHOL 237* 01/24/2015   CHOL 186 07/24/2013   Lab Results  Component Value Date   HDL 48 03/05/2015   HDL 50 01/24/2015   HDL 45 07/24/2013   Lab Results  Component Value Date   LDLCALC 131* 03/05/2015   LDLCALC 163* 01/24/2015   LDLCALC 116* 07/24/2013   Lab Results  Component Value Date   TRIG 86 03/05/2015   TRIG 120 01/24/2015   TRIG 123 07/24/2013   Lab Results  Component Value Date   CHOLHDL 4.1 03/05/2015   CHOLHDL 4.7 01/24/2015   CHOLHDL 4.1  07/24/2013   No results found for: LDLDIRECT       Studies: No results found.  ASSESSMENT AND PLAN:  1. Chest pain and dizziness: ECG is normal. Physical exam is unremarkable. There are both typical and atypical features suggestive of ischemic heart disease. I will obtain an exercise treadmill stress test.   Dispo: fu to be determined.   Signed: Kate Sable, M.D., F.A.C.C.  06/03/2015, 8:53 AM

## 2015-06-07 ENCOUNTER — Encounter (HOSPITAL_COMMUNITY): Payer: Self-pay

## 2015-06-12 ENCOUNTER — Encounter: Payer: Self-pay | Admitting: Neurology

## 2015-06-12 ENCOUNTER — Ambulatory Visit (INDEPENDENT_AMBULATORY_CARE_PROVIDER_SITE_OTHER): Payer: BLUE CROSS/BLUE SHIELD | Admitting: Neurology

## 2015-06-12 VITALS — BP 109/66 | HR 56 | Ht 71.0 in | Wt 210.6 lb

## 2015-06-12 DIAGNOSIS — G5603 Carpal tunnel syndrome, bilateral upper limbs: Secondary | ICD-10-CM | POA: Diagnosis not present

## 2015-06-12 NOTE — Patient Instructions (Signed)
Remember to drink plenty of fluid, eat healthy meals and do not skip any meals. Try to eat protein with a every meal and eat a healthy snack such as fruit or nuts in between meals. Try to keep a regular sleep-wake schedule and try to exercise daily, particularly in the form of walking, 20-30 minutes a day, if you can.   As far as diagnostic testing: emg/ncs  I would like to see you back for emg/ncs, sooner if we need to. Please call us with any interim questions, concerns, problems, updates or refill requests.   Our phone number is [phone]. We also have an after hours call service for urgent matters and there is a physician on-call for urgent questions. For any emergencies you know to call 911 or go to the nearest emergency room   

## 2015-06-12 NOTE — Progress Notes (Signed)
Marland Kitchen  [facility]    Provider:  Dr Jaynee Eagles Referring Provider: Kathyrn Drown, MD Primary Care Physician:  [name], MD  CC:  Bilateral carpal Tunnel Syndrome  HPI:  Phillip Mcguire is a 60 y.o. male here as a referral from Dr. Wolfgang Phoenix for hand pain. Past medical history sleep apnea, hearing loss, peripheral neuropathy, hyperlipidemia, chronic lumbar pain. Used to wear braces at night. Symptoms started years ago without inciting event such as trauma. Wrist braces help a little. He has pain in the hands, all the fingres, tingling. They wake him up at night. No significant weakness, mostly sensory problems. However he is getting weaker in the grip, he is an Clinical biochemist. Using his hands makes symptoms worse. Symptoms slowly progressive. He has neck pain. If he turns his head to the right can feel shooting pain all the way down the arm. No other deficits, no bowel or bladder changes, no ataxia, no headache, no dizziness, no radiculopathy  Reviewed notes, labs and imaging from outside physicians, which showed:   Basic metabolic panel normal January 2017. Hemoglobin A1c January 2017 5.6. Reviewed primary care notes. He reported numbness and tingling in the left hand and in the right hand. Worse with driving, goes to sleep. Exam with positive Phalen's maneuver and positive Tinel sign, diagnosis was likely bilateral carpal tunnel syndrome right greater than left using nighttime splints without success.  Review of Systems: Patient complains of symptoms per HPI as well as the following symptoms: Numbness and snoring. Pertinent negatives per HPI. All others negative.   Social History   Social History  . Marital Status: Married    Spouse Name: Phillip Mcguire  . Number of Children: 1  . Years of Education: 12   Occupational History  . 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     [employer]   Social History Main Topics  . Smoking status: Former Smoker -- 1.50 packs/day for 15 years    Types:  Cigarettes    Quit date: 01/13/1988  . Smokeless tobacco: Never Used  . Alcohol Use: 0.0 oz/week    0 Standard drinks or equivalent per week     Comment: rarely drink beer  . Drug Use: No  . Sexual Activity: Not on file   Other Topics Concern  . Not on file   Social History Narrative   He works in [employer], Energy manager   Lives with wife.     Caffeine use: 2 cups coffee per day    Family History  Problem Relation Age of Onset  . Heart disease Mother   . Hypertension Mother   . Diabetes Mother   . Hyperlipidemia Mother   . Heart disease Father   . Lung cancer Father   . Hyperlipidemia Father   . Allergies Son   . Cancer Maternal Aunt     breast  . Cancer Maternal Uncle     colon  . Cancer Paternal Aunt     breast  . Cancer Paternal Uncle     brain tumor  . Neuropathy Neg Hx     Past Medical History  Diagnosis Date  . Hearing loss   . Abnormal LFTs     mild elevation of total, indirect and direct bilirubin  . Congenital absence of kidney     Right  . GERD (gastroesophageal reflux disease)   . Allergy   . Sleep apnea     Past Surgical History  Procedure Laterality Date  . Otosclerosis  bilateral ear surgery  . Nasal septum surgery  1980's  . Esophagogastroduodenoscopy  01/2002  . Colonoscopy  2007  . Titanium implant Left March 2010    left middle ear  . Shoulder arthroscopy w/ rotator cuff repair Left 2003    Current Outpatient Prescriptions  Medication Sig Dispense Refill  . CALCIUM-VITAMIN D PO Take 1 tablet by mouth daily. 200 units calcium, 1100units vit D    . Cholecalciferol (VITAMIN D) 2000 units CAPS Take 2,000 Units by mouth daily.    Marland Kitchen esomeprazole (NEXIUM) 40 MG capsule Take 40 mg by mouth daily.    . Gluc-Chonn-MSM-Boswellia-Vit D (GLUCOSAMINE CHONDROITIN COMPLX) TABS Take by mouth.    . loratadine (CLARITIN) 10 MG tablet Take 10 mg by mouth daily.    . Magnesium Oxide 200 MG TABS Take 200 mg by mouth daily.    . montelukast  (SINGULAIR) 10 MG tablet Take 10 mg by mouth at bedtime.    . Multiple Vitamin (MULTIVITAMIN) capsule Take 1 capsule by mouth daily.      . Omega-3 Fatty Acids (FISH OIL PO) Take 1,000 mg by mouth daily.    . Probiotic Product (PROBIOTIC PO) Take 1 Dose by mouth daily. 300mg     . vitamin C (ASCORBIC ACID) 500 MG tablet Take 240 mg by mouth daily.      No current facility-administered medications for this visit.    Allergies as of 06/12/2015 - Review Complete 06/12/2015  Allergen Reaction Noted  . Latex  07/22/2010    Vitals: BP 109/66 mmHg  Pulse 56  Ht 5\' 11"  (1.803 m)  Wt 210 lb 9.6 oz (95.528 kg)  BMI 29.39 kg/m2 Last Weight:  Wt Readings from Last 1 Encounters:  06/12/15 210 lb 9.6 oz (95.528 kg)   Last Height:   Ht Readings from Last 1 Encounters:  06/12/15 5\' 11"  (1.803 m)    Physical exam: Exam: Gen: NAD, conversant, well nourised, obese, well groomed                     CV: RRR, no MRG. No Carotid Bruits. No peripheral edema, warm, nontender Eyes: Conjunctivae clear without exudates or hemorrhage  Neuro: Detailed Neurologic Exam  Speech:    Speech is normal; fluent and spontaneous with normal comprehension.  Cognition:    The patient is oriented to person, place, and time;     recent and remote memory intact;     language fluent;     normal attention, concentration,     fund of knowledge Cranial Nerves:    The pupils are equal, round, and reactive to light. The fundi are normal and spontaneous venous pulsations are present. Visual fields are full to finger confrontation. Extraocular movements are intact. Trigeminal sensation is intact and the muscles of mastication are normal. The face is symmetric. The palate elevates in the midline. Hearing intact. Voice is normal. Shoulder shrug is normal. The tongue has normal motion without fasciculations.   Coordination:    Normal finger to nose and heel to shin. Normal rapid alternating movements.   Gait:     Heel-toe and tandem gait are normal.   Motor Observation:    No asymmetry, no atrophy, and no involuntary movements noted. Tone:    Normal muscle tone.    Posture:    Posture is normal. normal erect    Strength:    Strength is V/V in the upper and lower limbs.      Sensation: intact to LT  Reflex Exam:  DTR's:    Deep tendon reflexes in the upper and lower extremities are normal bilaterally.   Toes:    The toes are downgoing bilaterally.   Clonus:    Clonus is absent. Positive Phalen's maneuver and Tinel sign.     Assessment/Plan:  60 year old male with likely bilateral carpal tunnel syndrome. EMG nerve conduction study to evaluate.  Sarina Ill, MD  [facility] [address] [place], [place] [address]  Phone [phone] Fax [phone]

## 2015-06-15 ENCOUNTER — Encounter: Payer: Self-pay | Admitting: Neurology

## 2015-06-19 ENCOUNTER — Ambulatory Visit (INDEPENDENT_AMBULATORY_CARE_PROVIDER_SITE_OTHER): Payer: Self-pay | Admitting: Neurology

## 2015-06-19 ENCOUNTER — Ambulatory Visit (INDEPENDENT_AMBULATORY_CARE_PROVIDER_SITE_OTHER): Payer: BLUE CROSS/BLUE SHIELD | Admitting: Neurology

## 2015-06-19 DIAGNOSIS — G5601 Carpal tunnel syndrome, right upper limb: Secondary | ICD-10-CM | POA: Diagnosis not present

## 2015-06-19 DIAGNOSIS — G5603 Carpal tunnel syndrome, bilateral upper limbs: Secondary | ICD-10-CM

## 2015-06-19 DIAGNOSIS — G5602 Carpal tunnel syndrome, left upper limb: Secondary | ICD-10-CM | POA: Diagnosis not present

## 2015-06-19 DIAGNOSIS — Z0289 Encounter for other administrative examinations: Secondary | ICD-10-CM

## 2015-06-22 NOTE — Progress Notes (Signed)
See procedure note.

## 2015-06-22 NOTE — Procedures (Signed)
[id number]    Provider:  Dr Jaynee Eagles Referring Provider: Kathyrn Drown, MD Primary Care Physician:  [name], MD  HPI:  Phillip Mcguire is a 61 y.o. male here as a referral from Dr. Wolfgang Phoenix for hand pain.   Summary:   Nerve Conduction Studies were performed on the bilateral upper extremities.  The right median APB motor nerve showed prolonged distal onset latency (5.4 ms, N<4.0). The right Median 2nd Digit sensory nerve showed prolonged distal peak latency (5.0 ms, N<3.9). F Wave studies indicate that the right Median F wave has normal latency.  The left median APB motor nerve showed prolonged distal onset latency (4.5 ms, N<4.0). The left Median 2nd Digit sensory nerve showed prolonged distal peak latency (4.3 ms, N<3.9).  F Wave studies indicate that the left Median F wave has normal latency.   Bilateral Ulnar ADM motor nerves were within normal limits. F Wave studies indicate that the bilateral Ulnar F waves have normal latencies  The bilateralUlnar 5th digit sensory nerves were within normal limits.    EMG needle study of selected bilateral upper extremity muscles was performed. The following muscles were normal bilaterally: Deltoid, Triceps, Pronator Teres, Opponens Pollicis, First Dorsal Interosseous and bilateral lower cervical paraspinals.  Conclusion: This is an abnormal study. There is electrophysiologic evidence of bilateral moderately-severe right > left Carpal Tunnel Syndrome. No suggestion of polyneuropathy  or radiculopathy.   Sarina Ill, MD  [facility] [address] [place], [place] [address]  Phone [phone] Fax [phone]

## 2015-06-22 NOTE — Progress Notes (Signed)
  [id number]    Provider:  Dr Jaynee Eagles Referring Provider: Kathyrn Drown, MD Primary Care Physician:  [name], MD  HPI:  Phillip Mcguire is a 60 y.o. male here as a referral from Dr. Wolfgang Phoenix for hand pain.   Summary:   Nerve Conduction Studies were performed on the bilateral upper extremities.  The right median APB motor nerve showed prolonged distal onset latency (5.4 ms, N<4.0). The right Median 2nd Digit sensory nerve showed prolonged distal peak latency (5.0 ms, N<3.9). F Wave studies indicate that the right Median F wave has normal latency.  The left median APB motor nerve showed prolonged distal onset latency (4.5 ms, N<4.0). The left Median 2nd Digit sensory nerve showed prolonged distal peak latency (4.3 ms, N<3.9).  F Wave studies indicate that the left Median F wave has normal latency.   Bilateral Ulnar ADM motor nerves were within normal limits. F Wave studies indicate that the bilateral Ulnar F waves have normal latencies  The bilateralUlnar 5th digit sensory nerves were within normal limits.    EMG needle study of selected bilateral upper extremity muscles was performed. The following muscles were normal bilaterally: Deltoid, Triceps, Pronator Teres, Opponens Pollicis, First Dorsal Interosseous and bilateral lower cervical paraspinals.  Conclusion: This is an abnormal study. There is electrophysiologic evidence of bilateral moderately-severe right > left Carpal Tunnel Syndrome. No suggestion of polyneuropathy  or radiculopathy.   Sarina Ill, MD  [facility] [address] [place], [place] [address]  Phone [phone] Fax [phone]

## 2015-07-25 ENCOUNTER — Other Ambulatory Visit: Payer: Self-pay | Admitting: Family Medicine

## 2015-09-12 ENCOUNTER — Encounter: Payer: Self-pay | Admitting: Internal Medicine

## 2015-10-23 ENCOUNTER — Encounter: Payer: Self-pay | Admitting: Physician Assistant

## 2015-10-23 ENCOUNTER — Ambulatory Visit (INDEPENDENT_AMBULATORY_CARE_PROVIDER_SITE_OTHER): Payer: BLUE CROSS/BLUE SHIELD | Admitting: Physician Assistant

## 2015-10-23 VITALS — BP 132/84 | HR 68 | Ht 71.5 in | Wt 212.0 lb

## 2015-10-23 DIAGNOSIS — R079 Chest pain, unspecified: Secondary | ICD-10-CM

## 2015-10-23 DIAGNOSIS — E785 Hyperlipidemia, unspecified: Secondary | ICD-10-CM | POA: Diagnosis not present

## 2015-10-23 NOTE — Progress Notes (Signed)
Cardiology Office Note    Date:  10/23/2015   ID:  Phillip Mcguire, DOB [birth year], MRN [id number]  PCP:  [name], MD  Cardiologist: Dr. Bronson Ing  Chief Complaint  Patient presents with  . Chest Pain    History of Present Illness:  Phillip Mcguire is a 60 y.o. male who saw Dr. Bronson Ing 05/2015 for chest pain and dizziness, typical and atypical features.EKG normal. History of HLD and GERD. Family history of CAD F-MI 52's, Mother defibrillator late 54's. GXT ordered but patient never had it done Because he got too busy.   Patient comes in today complaining of sharp chest pain when he takes a deep breath. It's been going on for a month and a half. 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 Tylenol eases it a little bit but never takes it completely away. He's had no further chest pain like he had when he was here in May. He denies any exertional chest pain, pressure, dyspnea, dyspnea on exertion, dizziness or presyncope. He doesn't smoke. He has no history of hypertension, diabetes.    Past Medical History:  Diagnosis Date  . Abnormal LFTs    mild elevation of total, indirect and direct bilirubin  . Allergy   . Congenital absence of kidney    Right  . GERD (gastroesophageal reflux disease)   . Hearing loss   . Sleep apnea     Past Surgical History:  Procedure Laterality Date  . COLONOSCOPY  2007  . ESOPHAGOGASTRODUODENOSCOPY  01/2002  . NASAL SEPTUM SURGERY  1980's  . otosclerosis     bilateral ear surgery  . SHOULDER ARTHROSCOPY W/ ROTATOR CUFF REPAIR Left 2003  . titanium implant Left March 2010   left middle ear    Current Medications: Outpatient Medications Prior to Visit  Medication Sig Dispense Refill  . CALCIUM-VITAMIN D PO Take 1 tablet by mouth daily. 200 units calcium, 1100units vit D    . Cholecalciferol (VITAMIN D) 2000 units CAPS Take 2,000 Units by mouth daily.    Marland Kitchen esomeprazole (NEXIUM) 40 MG capsule Take  40 mg by mouth daily.    Marland Kitchen esomeprazole (NEXIUM) 40 MG capsule TAKE 1 CAPSULE (40 MG TOTAL) BY MOUTH DAILY. 90 capsule 1  . Gluc-Chonn-MSM-Boswellia-Vit D (GLUCOSAMINE CHONDROITIN COMPLX) TABS Take by mouth.    . loratadine (CLARITIN) 10 MG tablet Take 10 mg by mouth daily.    . Magnesium Oxide 200 MG TABS Take 200 mg by mouth daily.    . montelukast (SINGULAIR) 10 MG tablet Take 10 mg by mouth at bedtime.    . Multiple Vitamin (MULTIVITAMIN) capsule Take 1 capsule by mouth daily.      . Omega-3 Fatty Acids (FISH OIL PO) Take 1,000 mg by mouth daily.    . Probiotic Product (PROBIOTIC PO) Take 1 Dose by mouth daily. 300mg     . vitamin C (ASCORBIC ACID) 500 MG tablet Take 240 mg by mouth daily.      No facility-administered medications prior to visit.      Allergies:   Latex   Social History   Social History  . Marital status: Married    Spouse name: [name]  . Number of children: 1  . Years of education: 48   Occupational History  . 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     [employer]   Social History Main Topics  . Smoking status: Former Smoker    Packs/day: 1.50  Years: 15.00    Types: Cigarettes    Quit date: 01/13/1988  . Smokeless tobacco: Never Used  . Alcohol use 0.0 oz/week     Comment: rarely drink beer  . Drug use: No  . Sexual activity: Yes    Partners: Female   Other Topics Concern  . None   Social History Narrative   He works in Theatre manager, Energy manager   Lives with wife.     Caffeine use: 2 cups coffee per day     Family History:  The patient's   family history includes Allergies in his son; Cancer in his maternal aunt, maternal uncle, paternal aunt, and paternal uncle; Diabetes in his mother; Heart disease in his father and mother; Hyperlipidemia in his father and mother; Hypertension in his mother; Lung cancer in his father.   ROS:   Please see the history of present illness.    Review of Systems  Constitution: Negative.  HENT: Positive for  headaches.   Cardiovascular: Positive for chest pain.  Respiratory: Positive for cough and shortness of breath.   Endocrine: Negative.   Hematologic/Lymphatic: Negative.   Musculoskeletal: Negative.   Gastrointestinal: Negative.   Genitourinary: Negative.    All other systems reviewed and are negative.   PHYSICAL EXAM:   VS:  BP 132/84   Pulse 68   Ht 5' 11.5" (1.816 m)   Wt 212 lb (96.2 kg)   SpO2 96%   BMI 29.16 kg/m   Physical Exam  GEN: Well nourished, well developed, in no acute distress  Neck: no JVD, carotid bruits, or masses Cardiac:Tender to touch on right upper chest RRR; no murmurs, rubs, or gallops  Respiratory:  clear to auscultation bilaterally, normal work of breathing GI: soft, nontender, nondistended, + BS Ext: without cyanosis, clubbing, or edema, Good distal pulses bilaterally MS: no deformity or atrophy  Skin: warm and dry, no rash Psych: euthymic mood, full affect  Wt Readings from Last 3 Encounters:  10/23/15 212 lb (96.2 kg)  06/12/15 210 lb 9.6 oz (95.5 kg)  06/03/15 207 lb (93.9 kg)      Studies/Labs Reviewed:   EKG:  EKG is  ordered today.  The ekg ordered today demonstrates Normal sinus rhythm, normal EKG  Recent Labs: 01/24/2015: ALT 30; BUN 17; Creatinine, Ser 1.01; Potassium 4.8; Sodium 140   Lipid Panel    Component Value Date/Time   CHOL 196 03/05/2015 1102   TRIG 86 03/05/2015 1102   HDL 48 03/05/2015 1102   CHOLHDL 4.1 03/05/2015 1102   CHOLHDL 4.1 07/24/2013 0804   VLDL 25 07/24/2013 0804   LDLCALC 131 (H) 03/05/2015 1102    Additional studies/ records that were reviewed today include:      ASSESSMENT:    1. Chest pain, unspecified type   2. Hyperlipidemia, unspecified hyperlipidemia type      PLAN:  In order of problems listed above:  Chest pain suspect his musculoskeletal as he is tender to touch and hurts with deep inspiration. He never did have his GXT back in May and does have multiple cardiac risk factors.  Recommend 2-D echo and GXT. Ibuprofen for the pain. Follow-up with Dr.Koneswaran after testing complete.  Hyperlipidemia recommend Lipitor      Medication Adjustments/Labs and Tests Ordered: Current medicines are reviewed at length with the patient today.  Concerns regarding medicines are outlined above.  Medication changes, Labs and Tests ordered today are listed in the Patient Instructions below. Patient Instructions  Your physician recommends that you  schedule a follow-up appointment with Dr. Bronson Ing after all test are complete.   Your physician recommends that you continue on your current medications as directed. Please refer to the Current Medication list given to you today.  Your physician has requested that you have an echocardiogram. Echocardiography is a painless test that uses sound waves to create images of your heart. It provides your doctor with information about the size and shape of your heart and how well your heart's chambers and valves are working. This procedure takes approximately one hour. There are no restrictions for this procedure.  If you need a refill on your cardiac medications before your next appointment, please call your pharmacy.  Thank you for choosing [facility]!       Sumner Boast, PA-C  10/23/2015 11:27 AM    [facility] [place], [place], [place]  [address] Phone: [phone]; Fax: [phone]

## 2015-10-23 NOTE — Patient Instructions (Signed)
Your physician recommends that you schedule a follow-up appointment with Dr. Bronson Ing after all test are complete.   Your physician recommends that you continue on your current medications as directed. Please refer to the Current Medication list given to you today.  Your physician has requested that you have an echocardiogram. Echocardiography is a painless test that uses sound waves to create images of your heart. It provides your doctor with information about the size and shape of your heart and how well your heart's chambers and valves are working. This procedure takes approximately one hour. There are no restrictions for this procedure.  If you need a refill on your cardiac medications before your next appointment, please call your pharmacy.  Thank you for choosing [facility]!

## 2015-10-31 ENCOUNTER — Encounter (HOSPITAL_COMMUNITY): Payer: Self-pay

## 2015-10-31 ENCOUNTER — Ambulatory Visit (HOSPITAL_COMMUNITY)
Admission: RE | Admit: 2015-10-31 | Discharge: 2015-10-31 | Disposition: A | Discharge status: Home / Self Care | Payer: BLUE CROSS/BLUE SHIELD | Source: Ambulatory Visit | Attending: Physician Assistant | Admitting: Physician Assistant

## 2015-10-31 ENCOUNTER — Ambulatory Visit (HOSPITAL_COMMUNITY)
Admission: RE | Admit: 2015-10-31 | Discharge: 2015-10-31 | Disposition: A | Discharge status: Home / Self Care | Payer: BLUE CROSS/BLUE SHIELD | Source: Ambulatory Visit | Attending: Cardiovascular Disease | Admitting: Cardiovascular Disease

## 2015-10-31 ENCOUNTER — Telehealth: Payer: Self-pay

## 2015-10-31 DIAGNOSIS — R079 Chest pain, unspecified: Secondary | ICD-10-CM

## 2015-10-31 DIAGNOSIS — I340 Nonrheumatic mitral (valve) insufficiency: Secondary | ICD-10-CM | POA: Insufficient documentation

## 2015-10-31 DIAGNOSIS — I371 Nonrheumatic pulmonary valve insufficiency: Secondary | ICD-10-CM | POA: Insufficient documentation

## 2015-10-31 LAB — EXERCISE TOLERANCE TEST
CSEPED: 10 min
CSEPEDS: 1 s
CSEPEW: 13.4 METS
CSEPHR: 98 %
CSEPPHR: 157 {beats}/min
MPHR: 160 {beats}/min
RPE: 15
Rest HR: 67 {beats}/min

## 2015-10-31 NOTE — Progress Notes (Signed)
*  PRELIMINARY RESULTS* Echocardiogram 2D Echocardiogram has been performed.  Samuel Germany 10/31/2015, 8:56 AM

## 2015-10-31 NOTE — Telephone Encounter (Signed)
Called cell phone - was unable to leave message as voicemail is not set up. Left message on home telephone for pt t return call.  cal.

## 2015-11-04 ENCOUNTER — Encounter: Payer: Self-pay | Admitting: Adult Health

## 2015-11-04 ENCOUNTER — Ambulatory Visit (INDEPENDENT_AMBULATORY_CARE_PROVIDER_SITE_OTHER): Payer: BLUE CROSS/BLUE SHIELD | Admitting: Adult Health

## 2015-11-04 VITALS — BP 115/70 | HR 59 | Ht 71.0 in | Wt 216.0 lb

## 2015-11-04 DIAGNOSIS — E7800 Pure hypercholesterolemia, unspecified: Secondary | ICD-10-CM

## 2015-11-04 DIAGNOSIS — R0789 Other chest pain: Secondary | ICD-10-CM | POA: Diagnosis not present

## 2015-11-04 NOTE — Patient Instructions (Signed)
Your physician recommends that you schedule a follow-up appointment in: as needed   Thank you for choosing [facility] !         

## 2015-11-04 NOTE — Progress Notes (Signed)
Name: Phillip Mcguire    DOB: [birth year]  Age: 60 y.o.  MR#: [id number]       PCP:  [name], MD      Insurance: Payor: [place] / Plan: [place] / Product Type: *No Product type* /   CC:   No chief complaint on file.   VS Vitals:   11/04/15 1546  BP: 115/70  Pulse: (!) 59  SpO2: 96%  Weight: 216 lb (98 kg)  Height: 5\' 11"  (1.803 m)    Weights Current Weight  11/04/15 216 lb (98 kg)  10/23/15 212 lb (96.2 kg)  06/12/15 210 lb 9.6 oz (95.5 kg)    Blood Pressure  BP Readings from Last 3 Encounters:  11/04/15 115/70  10/23/15 132/84  06/12/15 109/66     Admit date:  (Not on file) Last encounter with RMR:  Visit date not found   Allergy Latex  Current Outpatient Prescriptions  Medication Sig Dispense Refill  . CALCIUM-VITAMIN D PO Take 1 tablet by mouth daily. 200 units calcium, 1100units vit D    . Cholecalciferol (VITAMIN D) 2000 units CAPS Take 2,000 Units by mouth daily.    Marland Kitchen esomeprazole (NEXIUM) 40 MG capsule Take 40 mg by mouth daily.    Marland Kitchen esomeprazole (NEXIUM) 40 MG capsule TAKE 1 CAPSULE (40 MG TOTAL) BY MOUTH DAILY. 90 capsule 1  . Gluc-Chonn-MSM-Boswellia-Vit D (GLUCOSAMINE CHONDROITIN COMPLX) TABS Take by mouth.    . loratadine (CLARITIN) 10 MG tablet Take 10 mg by mouth daily.    . Magnesium Oxide 200 MG TABS Take 200 mg by mouth daily.    . montelukast (SINGULAIR) 10 MG tablet Take 10 mg by mouth at bedtime.    . Multiple Vitamin (MULTIVITAMIN) capsule Take 1 capsule by mouth daily.      . Omega-3 Fatty Acids (FISH OIL PO) Take 1,000 mg by mouth daily.    . Probiotic Product (PROBIOTIC PO) Take 1 Dose by mouth daily. 300mg     . vitamin C (ASCORBIC ACID) 500 MG tablet Take 240 mg by mouth daily.      No current facility-administered medications for this visit.     Discontinued Meds:   There are no discontinued medications.  Patient Active Problem List   Diagnosis Date Noted  . GERD (gastroesophageal reflux disease) 06/10/2014  .  Chronic lumbar pain 04/20/2014  . Prediabetes 10/20/2012  . Peripheral neuropathy ([place]) 10/20/2012  . OSA (obstructive sleep apnea) 07/30/2010  . Hyperlipidemia 01/29/2009  . HEARING LOSS 01/25/2009  . PAIN IN JOINT, SITE UNSPECIFIED 01/25/2009  . CHEST PAIN UNSPECIFIED 01/25/2009    LABS    Component Value Date/Time   NA 140 01/24/2015 0953   NA 140 04/24/2014 1655   NA 141 11/06/2008   K 4.8 01/24/2015 0953   K 3.9 04/24/2014 1655   K 4.3 11/06/2008   CL 97 01/24/2015 0953   CL 99 04/24/2014 1655   CL 100 11/06/2008   CO2 26 01/24/2015 0953   CO2 25 04/24/2014 1655   CO2 27 11/06/2008   GLUCOSE 91 01/24/2015 0953   GLUCOSE 99 04/24/2014 1655   GLUCOSE 93 11/06/2008   BUN 17 01/24/2015 0953   BUN 21 04/24/2014 1655   BUN 18 11/06/2008   CREATININE 1.01 01/24/2015 0953   CREATININE 0.91 04/24/2014 1655   CREATININE 1.05 11/06/2008   CALCIUM 10.0 01/24/2015 0953   CALCIUM 9.4 04/24/2014 1655   CALCIUM 9.9 11/06/2008   GFRNONAA 81 01/24/2015 0953  GFRNONAA 93 04/24/2014 1655   GFRAA 94 01/24/2015 0953   GFRAA 107 04/24/2014 1655   CMP     Component Value Date/Time   NA 140 01/24/2015 0953   K 4.8 01/24/2015 0953   CL 97 01/24/2015 0953   CO2 26 01/24/2015 0953   GLUCOSE 91 01/24/2015 0953   GLUCOSE 93 11/06/2008   BUN 17 01/24/2015 0953   CREATININE 1.01 01/24/2015 0953   CALCIUM 10.0 01/24/2015 0953   PROT 7.4 01/24/2015 0953   ALBUMIN 4.6 01/24/2015 0953   AST 23 01/24/2015 0953   ALT 30 01/24/2015 0953   ALKPHOS 85 01/24/2015 0953   BILITOT 1.3 (H) 01/24/2015 0953   GFRNONAA 81 01/24/2015 0953   GFRAA 94 01/24/2015 0953       Component Value Date/Time   WBC 8.4 10/13/2012 1657   HGB 15.8 10/13/2012 1657   HCT 45.3 10/13/2012 1657   MCV 84.7 10/13/2012 1657    Lipid Panel     Component Value Date/Time   CHOL 196 03/05/2015 1102   TRIG 86 03/05/2015 1102   HDL 48 03/05/2015 1102   CHOLHDL 4.1 03/05/2015 1102   CHOLHDL 4.1 07/24/2013 0804    VLDL 25 07/24/2013 0804   LDLCALC 131 (H) 03/05/2015 1102    ABG No results found for: PHART, PCO2ART, PO2ART, HCO3, TCO2, ACIDBASEDEF, O2SAT   Lab Results  Component Value Date   TSH 1.16 11/15/2012   BNP (last 3 results) No results for input(s): BNP in the last 8760 hours.  ProBNP (last 3 results) No results for input(s): PROBNP in the last 8760 hours.  Cardiac Panel (last 3 results) No results for input(s): CKTOTAL, CKMB, TROPONINI, RELINDX in the last 72 hours.  Iron/TIBC/Ferritin/ %Sat No results found for: IRON, TIBC, FERRITIN, IRONPCTSAT   EKG Orders placed or performed in visit on 10/23/15  . EKG 12-Lead     Prior Assessment and Plan Problem List as of 11/04/2015 Reviewed: 10/23/2015 11:17 AM by Ermalinda Barrios, PA-C     Respiratory   OSA (obstructive sleep apnea)   Last Assessment & Plan 09/03/2010 Office Visit Written 09/03/2010  6:14 PM by Kathee Delton, MD    The patient has fragmented sleep and alertness issues during the day, but it is unclear if it is due to his very mild sleep apnea.  The fact that he saw no difference with CPAP at a moderate setting makes me question whether sleep disordered breathing is the issue here.  This may simply be due to his poor sleep hygiene and limited quantity of sleep.  He may also have some other issue that we have not pinpointed that is disrupting his sleep as well.  He really has no history to suggest narcolepsy or idiopathic hypersomnia.  I would like to change his CPAP machine over to the automatic mode for a few weeks, and see if he notices a difference.  Will also get a download to make sure that he is wearing the device.  If he truly does not respond to CPAP, would discontinue.  Then we would have to decide whether to place the burden on the patient to improve his sleep hygiene, whether to check a MS LT, or whether we empirically treat him with a stimulant medication during the day to help with his alertness and functional  status.        Digestive   GERD (gastroesophageal reflux disease)     Nervous and Auditory   HEARING LOSS   Peripheral  neuropathy ([place])     Other   CHEST PAIN UNSPECIFIED   Hyperlipidemia   PAIN IN JOINT, SITE UNSPECIFIED   Prediabetes   Chronic lumbar pain       Imaging: No results found.

## 2015-11-04 NOTE — Progress Notes (Signed)
Cardiology Office Note   Date:  11/04/2015   ID:  Phillip Mcguire, DOB 1955/11/09, MRN [id number]  PCP:  [name], MD  Cardiologist:  Woodroe Chen, NP   No chief complaint on file.     History of Present Illness: Phillip Mcguire is a 60 y.o. male who presents for morning for ongoing assessment and management of chest pain, dizziness, with typical and atypical features with normal EKG. Other history includes hyperlipidemia and GERD. He was last seen by Sharyn Lull lens on 10/23/2015 with recurrent chest pain especially when taking deep breaths. He was diagnosed with muscle skeletal pain as he is tender to touch and hurts with deep inspiration. He did have multiple cardiovascular risk factors and a GXT and 2-D echo was ordered.  GXT on 10/31/2015 revealed normal blood pressure response to exercise, no ST segment deviation during stress, this was a low risk at [facility] treadmill score. Echocardiogram was also completed on 10/31/2015. Left ventricle: The cavity size was at the upper limits of   normal. Wall thickness was normal. Systolic function was normal.   The estimated ejection fraction was in the range of 55% to 60%.   Wall motion was normal; there were no regional wall motion   abnormalities. Left ventricular diastolic function parameters   were normal. - Mitral valve: There was mild regurgitation. - Atrial septum: No defect or patent foramen ovale was identified. - Pulmonic valve: There was mild regurgitation.  He is here today without any new complaints. He continues to have some soreness in his chest. He is not aware of any changes in heavy lifting, pushing pulling or changes in activity which may have caused this.  Past Medical History:  Diagnosis Date  . Abnormal LFTs    mild elevation of total, indirect and direct bilirubin  . Allergy   . Congenital absence of kidney    Right  . GERD (gastroesophageal reflux disease)   . Hearing loss   . Sleep apnea      Past Surgical History:  Procedure Laterality Date  . COLONOSCOPY  2007  . ESOPHAGOGASTRODUODENOSCOPY  01/2002  . NASAL SEPTUM SURGERY  1980's  . otosclerosis     bilateral ear surgery  . SHOULDER ARTHROSCOPY W/ ROTATOR CUFF REPAIR Left 2003  . titanium implant Left March 2010   left middle ear     Current Outpatient Prescriptions  Medication Sig Dispense Refill  . CALCIUM-VITAMIN D PO Take 1 tablet by mouth daily. 200 units calcium, 1100units vit D    . Cholecalciferol (VITAMIN D) 2000 units CAPS Take 2,000 Units by mouth daily.    Marland Kitchen esomeprazole (NEXIUM) 40 MG capsule Take 40 mg by mouth daily.    Marland Kitchen esomeprazole (NEXIUM) 40 MG capsule TAKE 1 CAPSULE (40 MG TOTAL) BY MOUTH DAILY. 90 capsule 1  . Gluc-Chonn-MSM-Boswellia-Vit D (GLUCOSAMINE CHONDROITIN COMPLX) TABS Take by mouth.    . loratadine (CLARITIN) 10 MG tablet Take 10 mg by mouth daily.    . Magnesium Oxide 200 MG TABS Take 200 mg by mouth daily.    . montelukast (SINGULAIR) 10 MG tablet Take 10 mg by mouth at bedtime.    . Multiple Vitamin (MULTIVITAMIN) capsule Take 1 capsule by mouth daily.      . Omega-3 Fatty Acids (FISH OIL PO) Take 1,000 mg by mouth daily.    . Probiotic Product (PROBIOTIC PO) Take 1 Dose by mouth daily. 300mg     . vitamin C (ASCORBIC ACID) 500 MG tablet Take  240 mg by mouth daily.      No current facility-administered medications for this visit.     Allergies:   Latex    Social History:  The patient  reports that he quit smoking about 27 years ago. His smoking use included Cigarettes. He has a 22.50 pack-year smoking history. He has never used smokeless tobacco. He reports that he drinks alcohol. He reports that he does not use drugs.   Family History:  The patient's family history includes Allergies in his son; Cancer in his maternal aunt, maternal uncle, paternal aunt, and paternal uncle; Diabetes in his mother; Heart disease in his father and mother; Hyperlipidemia in his father and mother;  Hypertension in his mother; Lung cancer in his father.    ROS: All other systems are reviewed and negative. Unless otherwise mentioned in H&P    PHYSICAL EXAM: VS:  BP 115/70   Pulse (!) 59   Ht 5\' 11"  (1.803 m)   Wt 216 lb (98 kg)   SpO2 96%   BMI 30.13 kg/m  , BMI Body mass index is 30.13 kg/m. GEN: Well nourished, well developed, in no acute distress  HEENT: normal  Neck: no JVD, carotid bruits, or masses Cardiac: RRR; no murmurs, rubs, or gallops,no edema  Respiratory:  clear to auscultation bilaterally, normal work of breathing GI: soft, nontender, nondistended, + BS MS: no deformity or atrophy  Skin: warm and dry, no rash Neuro:  Strength and sensation are intact Psych: euthymic mood, full affect  Recent Labs: 01/24/2015: ALT 30; BUN 17; Creatinine, Ser 1.01; Potassium 4.8; Sodium 140    Lipid Panel    Component Value Date/Time   CHOL 196 03/05/2015 1102   TRIG 86 03/05/2015 1102   HDL 48 03/05/2015 1102   CHOLHDL 4.1 03/05/2015 1102   CHOLHDL 4.1 07/24/2013 0804   VLDL 25 07/24/2013 0804   LDLCALC 131 (H) 03/05/2015 1102      Wt Readings from Last 3 Encounters:  11/04/15 216 lb (98 kg)  10/23/15 212 lb (96.2 kg)  06/12/15 210 lb 9.6 oz (95.5 kg)      Other studies Reviewed: Additional studies/ records that were reviewed today include: Echocardiogram Review of the above records demonstrates:  Study Conclusions  - Left ventricle: The cavity size was at the upper limits of   normal. Wall thickness was normal. Systolic function was normal.   The estimated ejection fraction was in the range of 55% to 60%.   Wall motion was normal; there were no regional wall motion   abnormalities. Left ventricular diastolic function parameters   were normal. - Mitral valve: There was mild regurgitation. - Atrial septum: No defect or patent foramen ovale was identified. - Pulmonic valve: There was mild regurgitation.  ASSESSMENT AND PLAN:  1.  Chest pain: Normal  stress test and echocardiogram. This argues against evidence of CAD at this time. Would continue risk management with blood pressure control, continued evaluation of lipids and LFTs, weight loss, and increased activity. This appears to be more musculoskeletal in etiology. I will refer him back to primary care, for further evaluation of possible musculoskeletal issues to include worsening cervical or lumbar spine issues.  2. Hyperlipidemia: I do not see that he is on statin therapy. Most recent cholesterol was ordered on 03/05/2015 per our records. Cholesterol was 196, HDL 48, LDL 131. Would recommend cholesterol level of 70 or less. Follow-up labs for primary care.   Current medicines are reviewed at length with the patient  today.    Labs/ tests ordered today include: No orders of the defined types were placed in this encounter.    Disposition:   FU with when necessary at the discretion of primary care.    Signed, Jory Sims, NP  11/04/2015 3:56 PM    [facility] [address] [place], [place] [address] Phone: [phone]; Fax: [phone]

## 2015-11-11 ENCOUNTER — Ambulatory Visit (HOSPITAL_COMMUNITY)
Admission: RE | Admit: 2015-11-11 | Discharge: 2015-11-11 | Disposition: A | Discharge status: Home / Self Care | Payer: BLUE CROSS/BLUE SHIELD | Source: Ambulatory Visit | Attending: Family Medicine | Admitting: Family Medicine

## 2015-11-11 ENCOUNTER — Ambulatory Visit (INDEPENDENT_AMBULATORY_CARE_PROVIDER_SITE_OTHER): Payer: BLUE CROSS/BLUE SHIELD | Admitting: Family Medicine

## 2015-11-11 ENCOUNTER — Encounter: Payer: Self-pay | Admitting: Family Medicine

## 2015-11-11 VITALS — BP 118/82 | Ht 71.0 in | Wt 218.6 lb

## 2015-11-11 DIAGNOSIS — E7849 Other hyperlipidemia: Secondary | ICD-10-CM

## 2015-11-11 DIAGNOSIS — E784 Other hyperlipidemia: Secondary | ICD-10-CM | POA: Diagnosis not present

## 2015-11-11 DIAGNOSIS — R0789 Other chest pain: Secondary | ICD-10-CM | POA: Diagnosis not present

## 2015-11-11 NOTE — Progress Notes (Signed)
   Subjective:    Patient ID: Phillip Mcguire, male    DOB: 10/16/1955, 60 y.o.   MRN: [id number]  HPI  Patient arrives to discuss recent work up with cardiology  For chest pain. Patient 218.6 lbs that cardiology cleared him from a cardiac aspect.The patient was seen for chest pain has a strong family history heart disease. His stress test and echo were negative. Patient follows up here today because he has ongoing sharp chest pains when he takes a deep breath but denies any shortness breath denies coughing fever sweats chills vomiting Review of Systems    please see above. Objective:   Physical Exam  Lungs clear hearts regular pulse normal extremities no edema skin warm dry chest wall nontender      Assessment & Plan:  Musculoskeletal chest pain I really doubt that there is any type of pulmonary embolism but we should get a chest x-ray. Patient will get this done. In addition to this patient will do cholesterol profile if it comes back significantly elevated recommend starting on statin

## 2015-11-13 LAB — LIPID PANEL
CHOLESTEROL TOTAL: 226 mg/dL — AB (ref 100–199)
Chol/HDL Ratio: 4.9 ratio units (ref 0.0–5.0)
HDL: 46 mg/dL (ref >39)
LDL Calculated: 151 mg/dL — ABNORMAL HIGH (ref 0–99)
Triglycerides: 144 mg/dL (ref 0–149)
VLDL CHOLESTEROL CAL: 29 mg/dL (ref 5–40)

## 2015-11-15 MED ORDER — ATORVASTATIN CALCIUM 10 MG PO TABS: 10.0000 mg | ORAL_TABLET | Freq: Every day | ORAL | 5 refills | Status: DC

## 2015-11-15 NOTE — Addendum Note (Signed)
Addended by: Ofilia Neas R on: 11/15/2015 09:04 AM   Modules accepted: Orders

## 2015-12-10 ENCOUNTER — Telehealth: Payer: Self-pay

## 2015-12-10 NOTE — Telephone Encounter (Signed)
Pt is having trouble with hemorrhoids and rectal bleeding. OV with Neil Crouch, PA on 12/25/2015 at 3:00 pm.

## 2015-12-10 NOTE — Telephone Encounter (Signed)
Pt called to set up his colonoscopy. He said he was due for a 10 yr colonoscopy in October. Please call [phone]

## 2015-12-23 ENCOUNTER — Telehealth: Payer: Self-pay | Admitting: Family Medicine

## 2015-12-23 NOTE — Telephone Encounter (Signed)
Patients spouse received a letter in the mail notifying [place] that it is time for him to do his physical which is due after 01/24/16.  His spouse is confused because he is supposed to be doing labwork and following up with Dr. Minta Balsam.  She would like for [place] to call [place] back with clarification on his blood work and when [place] should come in the office and what he needs to be seen for.

## 2015-12-24 NOTE — Telephone Encounter (Signed)
I am not quite sure what the wife is speaking of. I looked back there have been no sent by myself over the past few months. It is true that the patient is due for preventative health but he is also due for follow-up on his cholesterol and liver. I would recommend that we follow-up on his cholesterol and liver in the near future in then in the spring time we can do a comprehensive wellness exam. That way both issues are addressed over the course of the next few months followed

## 2015-12-25 ENCOUNTER — Ambulatory Visit (INDEPENDENT_AMBULATORY_CARE_PROVIDER_SITE_OTHER): Payer: BLUE CROSS/BLUE SHIELD | Admitting: Gastroenterology

## 2015-12-25 ENCOUNTER — Other Ambulatory Visit: Payer: Self-pay

## 2015-12-25 ENCOUNTER — Encounter: Payer: Self-pay | Admitting: Gastroenterology

## 2015-12-25 DIAGNOSIS — Z1211 Encounter for screening for malignant neoplasm of colon: Secondary | ICD-10-CM | POA: Diagnosis not present

## 2015-12-25 DIAGNOSIS — K649 Unspecified hemorrhoids: Secondary | ICD-10-CM | POA: Insufficient documentation

## 2015-12-25 NOTE — Progress Notes (Signed)
cc'ed to pcp °

## 2015-12-25 NOTE — Assessment & Plan Note (Signed)
Discussed possible [facility] banding if he is a candidate. Can be evaluated at time of colonoscopy. Handout provided to patient.

## 2015-12-25 NOTE — Telephone Encounter (Signed)
Discussed with pt

## 2015-12-25 NOTE — Progress Notes (Signed)
Primary Care Physician:  [name], MD  Primary Gastroenterologist:  Garfield Cornea, MD   Chief Complaint  Patient presents with  . Colonoscopy    HPI:  MD GORY is a 60 y.o. male here To schedule screening colonoscopy. Last one done 10 years ago was normal. Family history of colon cancer, maternal uncle. Patient has problems with hemorrhoids from time to time. More recently they have been flaring more frequently. Contributes this to lots of heavy lifting. Hemorrhoids tend it, some toilet tissue hematochezia at times. Denies abdominal pain. No constipation or diarrhea. Heartburn is well controlled. No dysphagia, vomiting. No unintentional weight loss.  Current Outpatient Prescriptions  Medication Sig Dispense Refill  . atorvastatin (LIPITOR) 10 MG tablet Take 1 tablet (10 mg total) by mouth daily. 30 tablet 5  . CALCIUM-VITAMIN D PO Take 1 tablet by mouth daily. 200 units calcium, 1100units vit D    . Cholecalciferol (VITAMIN D) 2000 units CAPS Take 2,000 Units by mouth daily.    Marland Kitchen esomeprazole (NEXIUM) 40 MG capsule TAKE 1 CAPSULE (40 MG TOTAL) BY MOUTH DAILY. 90 capsule 1  . Gluc-Chonn-MSM-Boswellia-Vit D (GLUCOSAMINE CHONDROITIN COMPLX) TABS Take by mouth.    . loratadine (CLARITIN) 10 MG tablet Take 10 mg by mouth daily.    . Magnesium Oxide 200 MG TABS Take 200 mg by mouth daily.    . montelukast (SINGULAIR) 10 MG tablet Take 10 mg by mouth at bedtime.    . Multiple Vitamin (MULTIVITAMIN) capsule Take 1 capsule by mouth daily.      . Omega-3 Fatty Acids (FISH OIL PO) Take 1,000 mg by mouth daily.    . Probiotic Product (PROBIOTIC PO) Take 1 Dose by mouth daily. 300mg     . vitamin C (ASCORBIC ACID) 500 MG tablet Take 240 mg by mouth daily.      No current facility-administered medications for this visit.     Allergies as of 12/25/2015 - Review Complete 12/25/2015  Allergen Reaction Noted  . Latex  07/22/2010    Past Medical History:  Diagnosis Date  . Abnormal LFTs     mild elevation of total, indirect and direct bilirubin  . Allergy   . Congenital absence of kidney    Right  . GERD (gastroesophageal reflux disease)   . Hearing loss   . Sleep apnea     Past Surgical History:  Procedure Laterality Date  . COLONOSCOPY  2007   [place]: normal  . ESOPHAGOGASTRODUODENOSCOPY  01/2002   Dr. Deatra Ina: Normal  . NASAL SEPTUM SURGERY  1980's  . otosclerosis     bilateral ear surgery  . SHOULDER ARTHROSCOPY W/ ROTATOR CUFF REPAIR Left 2003  . titanium implant Left March 2010   left middle ear    Family History  Problem Relation Age of Onset  . Heart disease Mother   . Hypertension Mother   . Diabetes Mother   . Hyperlipidemia Mother   . Heart disease Father   . Lung cancer Father   . Hyperlipidemia Father   . Allergies Son   . Cancer Maternal Aunt     breast  . Cancer Maternal Uncle     colon  . Cancer Paternal Aunt     breast  . Cancer Paternal Uncle     brain tumor  . Neuropathy Neg Hx     Social History   Social History  . Marital status: Married    Spouse name: [name]  . Number of children: 1  . Years of  education: 12   Occupational History  . 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     [employer]   Social History Main Topics  . Smoking status: Former Smoker    Packs/day: 1.50    Years: 15.00    Types: Cigarettes    Quit date: 01/13/1988  . Smokeless tobacco: Never Used  . Alcohol use 0.0 oz/week     Comment: rarely drink beer  . Drug use: No  . Sexual activity: Yes    Partners: Female   Other Topics Concern  . Not on file   Social History Narrative   He works in [employer], Energy manager   Lives with wife.     Caffeine use: 2 cups coffee per day      ROS:  General: Negative for anorexia, weight loss, fever, chills, fatigue, weakness. Eyes: Negative for vision changes.  ENT: Negative for hoarseness, difficulty swallowing , nasal congestion. CV: Negative for chest pain, angina, palpitations, dyspnea on exertion,  peripheral edema.  Respiratory: Negative for dyspnea at rest, dyspnea on exertion, cough, sputum, wheezing.  GI: See history of present illness. GU:  Negative for dysuria, hematuria, urinary incontinence, urinary frequency, nocturnal urination.  MS: Negative for joint pain, low back pain.  Derm: Negative for rash or itching.  Neuro: Negative for weakness, abnormal sensation, seizure, frequent headaches, memory loss, confusion.  Psych: Negative for anxiety, depression, suicidal ideation, hallucinations.  Endo: Negative for unusual weight change.  Heme: Negative for bruising or bleeding. Allergy: Negative for rash or hives.    Physical Examination:  BP 136/87   Pulse 63   Temp 97.3 F (36.3 C) (Oral)   Ht 5\' 11"  (1.803 m)   Wt 219 lb (99.3 kg)   BMI 30.54 kg/m    General: Well-nourished, well-developed in no acute distress.  Head: Normocephalic, atraumatic.   Eyes: Conjunctiva pink, no icterus. Mouth: Oropharyngeal mucosa moist and pink , no lesions erythema or exudate. Neck: Supple without thyromegaly, masses, or lymphadenopathy.  Lungs: Clear to auscultation bilaterally.  Heart: Regular rate and rhythm, no murmurs rubs or gallops.  Abdomen: Bowel sounds are normal, nontender, nondistended, no hepatosplenomegaly or masses, no abdominal bruits or    hernia , no rebound or guarding.   Rectal: Deferred Extremities: No lower extremity edema. No clubbing or deformities.  Neuro: Alert and oriented x 4 , grossly normal neurologically.  Skin: Warm and dry, no rash or jaundice.   Psych: Alert and cooperative, normal mood and affect.  Labs: Lab Results  Component Value Date   ALT 30 01/24/2015   AST 23 01/24/2015   ALKPHOS 85 01/24/2015   BILITOT 1.3 (H) 01/24/2015   Lab Results  Component Value Date   CREATININE 1.01 01/24/2015   BUN 17 01/24/2015   NA 140 01/24/2015   K 4.8 01/24/2015   CL 97 01/24/2015   CO2 26 01/24/2015    Lab Results  Component Value Date   HGBA1C  5.6 01/24/2015     Imaging Studies: No results found.

## 2015-12-25 NOTE — Patient Instructions (Signed)
1. Colonoscopy as scheduled. Please see separate instructions. 2. Handout provided today regarding hemorrhoid banding. Dr. Gala Romney we'll let you know if you are a candidate after your colonoscopy.

## 2015-12-25 NOTE — Assessment & Plan Note (Signed)
Screening colonoscopy in the near future.  I have discussed the risks, alternatives, benefits with regards to but not limited to the risk of reaction to medication, bleeding, infection, perforation and the patient is agreeable to proceed. Written consent to be obtained.  

## 2015-12-26 ENCOUNTER — Ambulatory Visit (HOSPITAL_COMMUNITY)
Admission: RE | Admit: 2015-12-26 | Discharge: 2015-12-26 | Disposition: A | Discharge status: Home / Self Care | Payer: BLUE CROSS/BLUE SHIELD | Source: Ambulatory Visit | Attending: Internal Medicine | Admitting: Internal Medicine

## 2015-12-26 ENCOUNTER — Encounter (HOSPITAL_COMMUNITY)
Admission: RE | Disposition: A | Discharge status: Home / Self Care | Payer: Self-pay | Source: Ambulatory Visit | Attending: Internal Medicine

## 2015-12-26 ENCOUNTER — Encounter (HOSPITAL_COMMUNITY): Payer: Self-pay | Admitting: *Deleted

## 2015-12-26 DIAGNOSIS — Z1212 Encounter for screening for malignant neoplasm of rectum: Secondary | ICD-10-CM

## 2015-12-26 DIAGNOSIS — G4730 Sleep apnea, unspecified: Secondary | ICD-10-CM | POA: Diagnosis not present

## 2015-12-26 DIAGNOSIS — K635 Polyp of colon: Secondary | ICD-10-CM | POA: Diagnosis not present

## 2015-12-26 DIAGNOSIS — Z1211 Encounter for screening for malignant neoplasm of colon: Secondary | ICD-10-CM | POA: Diagnosis not present

## 2015-12-26 DIAGNOSIS — K219 Gastro-esophageal reflux disease without esophagitis: Secondary | ICD-10-CM | POA: Diagnosis not present

## 2015-12-26 DIAGNOSIS — Z79899 Other long term (current) drug therapy: Secondary | ICD-10-CM | POA: Diagnosis not present

## 2015-12-26 DIAGNOSIS — Z800 Family history of malignant neoplasm of digestive organs: Secondary | ICD-10-CM | POA: Insufficient documentation

## 2015-12-26 DIAGNOSIS — Z87891 Personal history of nicotine dependence: Secondary | ICD-10-CM | POA: Diagnosis not present

## 2015-12-26 DIAGNOSIS — K5730 Diverticulosis of large intestine without perforation or abscess without bleeding: Secondary | ICD-10-CM | POA: Insufficient documentation

## 2015-12-26 HISTORY — PX: COLONOSCOPY: SHX5424

## 2015-12-26 SURGERY — COLONOSCOPY
Anesthesia: Moderate Sedation

## 2015-12-26 MED ORDER — STERILE WATER FOR IRRIGATION IR SOLN
Status: DC | PRN
  Administered 2015-12-26: 15:00:00

## 2015-12-26 MED ORDER — MEPERIDINE HCL 100 MG/ML IJ SOLN
INTRAMUSCULAR | Status: DC | PRN
  Administered 2015-12-26: 50 mg via INTRAVENOUS
  Administered 2015-12-26 (×2): 25 mg via INTRAVENOUS

## 2015-12-26 MED ORDER — ONDANSETRON HCL 4 MG/2ML IJ SOLN
INTRAMUSCULAR | Status: AC
  Filled 2015-12-26: qty 2

## 2015-12-26 MED ORDER — ONDANSETRON HCL 4 MG/2ML IJ SOLN
INTRAMUSCULAR | Status: DC | PRN
  Administered 2015-12-26: 4 mg via INTRAVENOUS

## 2015-12-26 MED ORDER — SODIUM CHLORIDE 0.9 % IV SOLN
INTRAVENOUS | Status: DC
  Administered 2015-12-26: 1000 mL via INTRAVENOUS

## 2015-12-26 MED ORDER — MEPERIDINE HCL 100 MG/ML IJ SOLN
INTRAMUSCULAR | Status: AC
  Filled 2015-12-26: qty 2

## 2015-12-26 MED ORDER — MIDAZOLAM HCL 5 MG/5ML IJ SOLN
INTRAMUSCULAR | Status: AC
  Filled 2015-12-26: qty 10

## 2015-12-26 MED ORDER — MIDAZOLAM HCL 5 MG/5ML IJ SOLN
INTRAMUSCULAR | Status: DC | PRN
  Administered 2015-12-26: 2 mg via INTRAVENOUS
  Administered 2015-12-26 (×2): 1 mg via INTRAVENOUS
  Administered 2015-12-26: 2 mg via INTRAVENOUS

## 2015-12-26 NOTE — Op Note (Signed)
[facility] Patient Name: Phillip Mcguire Procedure Date: 12/26/2015 3:09 PM MRN: [id number] Date of Birth: [birth year] Attending MD: Phillip Mcguire , MD CSN: [id number] Age: 60 Admit Type: Outpatient Procedure:                Colonoscopy with snare polypectomy Indications:              Screening for colorectal malignant neoplasm Providers:                Phillip Richards, MD, Jeanann Lewandowsky. Sharon Seller, RN,                            Isabella Stalling, Technician Referring MD:              Medicines:                Midazolam 7 mg IV, Meperidine 123XX123 mg IV Complications:            No immediate complications. Estimated Blood Loss:     Estimated blood loss was minimal. Procedure:                Pre-Anesthesia Assessment:                           - Prior to the procedure, a History and Physical                            was performed, and patient medications and                            allergies were reviewed. The patient's tolerance of                            previous anesthesia was also reviewed. The risks                            and benefits of the procedure and the sedation                            options and risks were discussed with the patient.                            All questions were answered, and informed consent                            was obtained. Prior Anticoagulants: The patient has                            taken no previous anticoagulant or antiplatelet                            agents. ASA Grade Assessment: II - A patient with                            mild systemic disease. After reviewing the risks  and benefits, the patient was deemed in                            satisfactory condition to undergo the procedure.                           After obtaining informed consent, the colonoscope                            was passed under direct vision. Throughout the                            procedure, the patient's blood  pressure, pulse, and                            oxygen saturations were monitored continuously. The                            [id number]) scope was introduced through                            the anus and advanced to the the cecum, identified                            by appendiceal orifice and ileocecal valve. The                            colonoscopy was performed without difficulty. The                            patient tolerated the procedure well. The quality                            of the bowel preparation was adequate. The terminal                            ileum, ileocecal valve, appendiceal orifice, and                            rectum were photographed. The entire colon was well                            visualized. The colonoscopy was performed without                            difficulty. The patient tolerated the procedure                            well. The quality of the bowel preparation was                            adequate. Scope In: 3:20:23 PM Scope Out: 3:43:32 PM Scope Withdrawal Time: 0 hours 18 minutes 15 seconds  Total Procedure Duration: 0 hours 23  minutes 9 seconds  Findings:      The perianal and digital rectal examinations were normal.      A 4 mm polyp was found in the splenic flexure. The polyp was       semi-pedunculated. The polyp was removed with a cold snare. Resection       and retrieval were complete.      No other significant abnormalities were identified in a careful       examination of the remainder of the colon.      Scattered diverticula were found in the sigmoid colon and descending       colon.      The exam was otherwise without abnormality on direct and retroflexion       views. Impression:               - One 4 mm polyp at the splenic flexure, removed                            with a cold snare. Resected and retrieved.                           - Diverticulosis in the sigmoid colon and in the                             descending colon.                           - The examination was otherwise normal on direct                            and retroflexion views. Moderate Sedation:      Moderate (conscious) sedation was personally administered by an       anesthesia professional. The following parameters were monitored: oxygen       saturation, heart rate, blood pressure, respiratory rate, EKG, adequacy       of pulmonary ventilation, and response to care. Total physician       intraservice time was 34 minutes. Recommendation:           - Patient has a contact number available for                            emergencies. The signs and symptoms of potential                            delayed complications were discussed with the                            patient. Return to normal activities tomorrow.                            Written discharge instructions were provided to the                            patient.                           - Resume  previous diet.                           - Continue present medications.                           - Await pathology results.                           - Repeat colonoscopy date to be determined after                            pending pathology results are reviewed for                            surveillance based on pathology results. Procedure Code(s):        --- Professional ---                           [phone], Colonoscopy, flexible; with removal of                            tumor(s), polyp(s), or other lesion(s) by snare                            technique Diagnosis Code(s):        --- Professional ---                           Z12.11, Encounter for screening for malignant                            neoplasm of colon                           D12.6, Benign neoplasm of colon, unspecified                           D12.3, Benign neoplasm of transverse colon (hepatic                            flexure or splenic flexure) CPT copyright 2016 American  Medical Association. All rights reserved. The codes documented in this report are preliminary and upon coder review may  be revised to meet current compliance requirements. Phillip Estimable. Kmya Placide, MD Phillip Richards, MD 12/26/2015 4:00:07 PM This report has been signed electronically. Number of Addenda: 0

## 2015-12-26 NOTE — Discharge Instructions (Addendum)
Colonoscopy Discharge Instructions  Read the instructions outlined below and refer to this sheet in the next few weeks. These discharge instructions provide you with general information on caring for yourself after you leave the hospital. Your doctor may also give you specific instructions. While your treatment has been planned according to the most current medical practices available, unavoidable complications occasionally occur. If you have any problems or questions after discharge, call Dr. Gala Romney at [phone]. ACTIVITY  You may resume your regular activity, but move at a slower pace for the next 24 hours.   Take frequent rest periods for the next 24 hours.   Walking will help get rid of the air and reduce the bloated feeling in your belly (abdomen).   No driving for 24 hours (because of the medicine (anesthesia) used during the test).    Do not sign any important legal documents or operate any machinery for 24 hours (because of the anesthesia used during the test).  NUTRITION  Drink plenty of fluids.   You may resume your normal diet as instructed by your doctor.   Begin with a light meal and progress to your normal diet. Heavy or fried foods are harder to digest and may make you feel sick to your stomach (nauseated).   Avoid alcoholic beverages for 24 hours or as instructed.  MEDICATIONS  You may resume your normal medications unless your doctor tells you otherwise.  WHAT YOU CAN EXPECT TODAY  Some feelings of bloating in the abdomen.   Passage of more gas than usual.   Spotting of blood in your stool or on the toilet paper.  IF YOU HAD POLYPS REMOVED DURING THE COLONOSCOPY:  No aspirin products for 7 days or as instructed.   No alcohol for 7 days or as instructed.   Eat a soft diet for the next 24 hours.  FINDING OUT THE RESULTS OF YOUR TEST Not all test results are available during your visit. If your test results are not back during the visit, make an appointment  with your caregiver to find out the results. Do not assume everything is normal if you have not heard from your caregiver or the medical facility. It is important for you to follow up on all of your test results.  SEEK IMMEDIATE MEDICAL ATTENTION IF:  You have more than a spotting of blood in your stool.   Your belly is swollen (abdominal distention).   You are nauseated or vomiting.   You have a temperature over 101.   You have abdominal pain or discomfort that is severe or gets worse throughout the day.    Colon polyp and diverticulosis information provided  Further recommendations to follow pending review of pathology report   Colon Polyps Introduction Polyps are tissue growths inside the body. Polyps can grow in many places, including the large intestine (colon). A polyp may be a round bump or a mushroom-shaped growth. You could have one polyp or several. Most colon polyps are noncancerous (benign). However, some colon polyps can become cancerous over time. What are the causes? The exact cause of colon polyps is not known. What increases the risk? This condition is more likely to develop in people who:  Have a family history of colon cancer or colon polyps.  Are older than 75 or older than 45 if they are African American.  Have inflammatory bowel disease, such as ulcerative colitis or Crohn disease.  Are overweight.  Smoke cigarettes.  Do not get enough exercise.  Drink  too much alcohol.  Eat a diet that is:  High in fat and red meat.  Low in fiber.  Had childhood cancer that was treated with abdominal radiation. What are the signs or symptoms? Most polyps do not cause symptoms. If you have symptoms, they may include:  Blood coming from your rectum when having a bowel movement.  Blood in your stool.The stool may look dark red or black.  A change in bowel habits, such as constipation or diarrhea. How is this diagnosed? This condition is diagnosed with a  colonoscopy. This is a procedure that uses a lighted, flexible scope to look at the inside of your colon. How is this treated? Treatment for this condition involves removing any polyps that are found. Those polyps will then be tested for cancer. If cancer is found, your health care provider will talk to you about options for colon cancer treatment. Follow these instructions at home: Diet  Eat plenty of fiber, such as fruits, vegetables, and whole grains.  Eat foods that are high in calcium and vitamin D, such as milk, cheese, yogurt, eggs, liver, fish, and broccoli.  Limit foods high in fat, red meats, and processed meats, such as hot dogs, sausage, bacon, and lunch meats.  Maintain a healthy weight, or lose weight if recommended by your health care provider. General instructions  Do not smoke cigarettes.  Do not drink alcohol excessively.  Keep all follow-up visits as told by your health care provider. This is important. This includes keeping regularly scheduled colonoscopies. Talk to your health care provider about when you need a colonoscopy.  Exercise every day or as told by your health care provider. Contact a health care provider if:  You have new or worsening bleeding during a bowel movement.  You have new or increased blood in your stool.  You have a change in bowel habits.  You unexpectedly lose weight.   Diverticulosis Diverticulosis is the condition that develops when small pouches (diverticula) form in the wall of your colon. Your colon, or large intestine, is where water is absorbed and stool is formed. The pouches form when the inside layer of your colon pushes through weak spots in the outer layers of your colon. CAUSES  No one knows exactly what causes diverticulosis. RISK FACTORS  Being older than 67. Your risk for this condition increases with age. Diverticulosis is rare in people younger than 40 years. By age 23, almost everyone has it.  Eating a low-fiber  diet.  Being frequently constipated.  Being overweight.  Not getting enough exercise.  Smoking.  Taking over-the-counter pain medicines, like aspirin and ibuprofen. SYMPTOMS  Most people with diverticulosis do not have symptoms. DIAGNOSIS  Because diverticulosis often has no symptoms, health care providers often discover the condition during an exam for other colon problems. In many cases, a health care provider will diagnose diverticulosis while using a flexible scope to examine the colon (colonoscopy). TREATMENT  If you have never developed an infection related to diverticulosis, you may not need treatment. If you have had an infection before, treatment may include:  Eating more fruits, vegetables, and grains.  Taking a fiber supplement.  Taking a live bacteria supplement (probiotic).  Taking medicine to relax your colon. HOME CARE INSTRUCTIONS   Drink at least 6-8 glasses of water each day to prevent constipation.  Try not to strain when you have a bowel movement.  Keep all follow-up appointments. If you have had an infection before:  Increase the fiber in  your diet as directed by your health care provider or dietitian.  Take a dietary fiber supplement if your health care provider approves.  Only take medicines as directed by your health care provider. SEEK MEDICAL CARE IF:   You have abdominal pain.  You have bloating.  You have cramps.  You have not gone to the bathroom in 3 days. SEEK IMMEDIATE MEDICAL CARE IF:   Your pain gets worse.  Yourbloating becomes very bad.  You have a fever or chills, and your symptoms suddenly get worse.  You begin vomiting.  You have bowel movements that are bloody or black. MAKE SURE YOU:  Understand these instructions.  Will watch your condition.  Will get help right away if you are not doing well or get worse.

## 2015-12-26 NOTE — H&P (View-Only) (Signed)
Primary Care Physician:  [name], MD  Primary Gastroenterologist:  Garfield Cornea, MD   Chief Complaint  Patient presents with  . Colonoscopy    HPI:  Phillip Mcguire is a 60 y.o. male here To schedule screening colonoscopy. Last one done 10 years ago was normal. Family history of colon cancer, maternal uncle. Patient has problems with hemorrhoids from time to time. More recently they have been flaring more frequently. Contributes this to lots of heavy lifting. Hemorrhoids tend it, some toilet tissue hematochezia at times. Denies abdominal pain. No constipation or diarrhea. Heartburn is well controlled. No dysphagia, vomiting. No unintentional weight loss.  Current Outpatient Prescriptions  Medication Sig Dispense Refill  . atorvastatin (LIPITOR) 10 MG tablet Take 1 tablet (10 mg total) by mouth daily. 30 tablet 5  . CALCIUM-VITAMIN D PO Take 1 tablet by mouth daily. 200 units calcium, 1100units vit D    . Cholecalciferol (VITAMIN D) 2000 units CAPS Take 2,000 Units by mouth daily.    Marland Kitchen esomeprazole (NEXIUM) 40 MG capsule TAKE 1 CAPSULE (40 MG TOTAL) BY MOUTH DAILY. 90 capsule 1  . Gluc-Chonn-MSM-Boswellia-Vit D (GLUCOSAMINE CHONDROITIN COMPLX) TABS Take by mouth.    . loratadine (CLARITIN) 10 MG tablet Take 10 mg by mouth daily.    . Magnesium Oxide 200 MG TABS Take 200 mg by mouth daily.    . montelukast (SINGULAIR) 10 MG tablet Take 10 mg by mouth at bedtime.    . Multiple Vitamin (MULTIVITAMIN) capsule Take 1 capsule by mouth daily.      . Omega-3 Fatty Acids (FISH OIL PO) Take 1,000 mg by mouth daily.    . Probiotic Product (PROBIOTIC PO) Take 1 Dose by mouth daily. 300mg     . vitamin C (ASCORBIC ACID) 500 MG tablet Take 240 mg by mouth daily.      No current facility-administered medications for this visit.     Allergies as of 12/25/2015 - Review Complete 12/25/2015  Allergen Reaction Noted  . Latex  07/22/2010    Past Medical History:  Diagnosis Date  . Abnormal LFTs     mild elevation of total, indirect and direct bilirubin  . Allergy   . Congenital absence of kidney    Right  . GERD (gastroesophageal reflux disease)   . Hearing loss   . Sleep apnea     Past Surgical History:  Procedure Laterality Date  . COLONOSCOPY  2007   Rourk: normal  . ESOPHAGOGASTRODUODENOSCOPY  01/2002   Dr. Deatra Ina: Normal  . NASAL SEPTUM SURGERY  1980's  . otosclerosis     bilateral ear surgery  . SHOULDER ARTHROSCOPY W/ ROTATOR CUFF REPAIR Left 2003  . titanium implant Left March 2010   left middle ear    Family History  Problem Relation Age of Onset  . Heart disease Mother   . Hypertension Mother   . Diabetes Mother   . Hyperlipidemia Mother   . Heart disease Father   . Lung cancer Father   . Hyperlipidemia Father   . Allergies Son   . Cancer Maternal Aunt     breast  . Cancer Maternal Uncle     colon  . Cancer Paternal Aunt     breast  . Cancer Paternal Uncle     brain tumor  . Neuropathy Neg Hx     Social History   Social History  . Marital status: Married    Spouse name: [name]  . Number of children: 1  . Years of  education: 12   Occupational History  . 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     [place]   Social History Main Topics  . Smoking status: Former Smoker    Packs/day: 1.50    Years: 15.00    Types: Cigarettes    Quit date: 01/13/1988  . Smokeless tobacco: Never Used  . Alcohol use 0.0 oz/week     Comment: rarely drink beer  . Drug use: No  . Sexual activity: Yes    Partners: Female   Other Topics Concern  . Not on file   Social History Narrative   He works in [employer], Energy manager   Lives with wife.     Caffeine use: 2 cups coffee per day      ROS:  General: Negative for anorexia, weight loss, fever, chills, fatigue, weakness. Eyes: Negative for vision changes.  ENT: Negative for hoarseness, difficulty swallowing , nasal congestion. CV: Negative for chest pain, angina, palpitations, dyspnea on exertion,  peripheral edema.  Respiratory: Negative for dyspnea at rest, dyspnea on exertion, cough, sputum, wheezing.  GI: See history of present illness. GU:  Negative for dysuria, hematuria, urinary incontinence, urinary frequency, nocturnal urination.  MS: Negative for joint pain, low back pain.  Derm: Negative for rash or itching.  Neuro: Negative for weakness, abnormal sensation, seizure, frequent headaches, memory loss, confusion.  Psych: Negative for anxiety, depression, suicidal ideation, hallucinations.  Endo: Negative for unusual weight change.  Heme: Negative for bruising or bleeding. Allergy: Negative for rash or hives.    Physical Examination:  BP 136/87   Pulse 63   Temp 97.3 F (36.3 C) (Oral)   Ht 5\' 11"  (1.803 m)   Wt 219 lb (99.3 kg)   BMI 30.54 kg/m    General: Well-nourished, well-developed in no acute distress.  Head: Normocephalic, atraumatic.   Eyes: Conjunctiva pink, no icterus. Mouth: Oropharyngeal mucosa moist and pink , no lesions erythema or exudate. Neck: Supple without thyromegaly, masses, or lymphadenopathy.  Lungs: Clear to auscultation bilaterally.  Heart: Regular rate and rhythm, no murmurs rubs or gallops.  Abdomen: Bowel sounds are normal, nontender, nondistended, no hepatosplenomegaly or masses, no abdominal bruits or    hernia , no rebound or guarding.   Rectal: Deferred Extremities: No lower extremity edema. No clubbing or deformities.  Neuro: Alert and oriented x 4 , grossly normal neurologically.  Skin: Warm and dry, no rash or jaundice.   Psych: Alert and cooperative, normal mood and affect.  Labs: Lab Results  Component Value Date   ALT 30 01/24/2015   AST 23 01/24/2015   ALKPHOS 85 01/24/2015   BILITOT 1.3 (H) 01/24/2015   Lab Results  Component Value Date   CREATININE 1.01 01/24/2015   BUN 17 01/24/2015   NA 140 01/24/2015   K 4.8 01/24/2015   CL 97 01/24/2015   CO2 26 01/24/2015    Lab Results  Component Value Date   HGBA1C  5.6 01/24/2015     Imaging Studies: No results found.

## 2015-12-26 NOTE — Interval H&P Note (Signed)
History and Physical Interval Note:  12/26/2015 3:03 PM  Phillip Mcguire  has presented today for surgery, with the diagnosis of Screening  The various methods of treatment have been discussed with the patient and family. After consideration of risks, benefits and other options for treatment, the patient has consented to  Procedure(s) with comments: COLONOSCOPY (N/A) - 2:00 PM as a surgical intervention .  The patient's history has been reviewed, patient examined, no change in status, stable for surgery.  I have reviewed the patient's chart and labs.  Questions were answered to the patient's satisfaction.       No change.  Screening colonoscopy per plan.The risks, benefits, limitations, alternatives and imponderables have been reviewed with the patient. Questions have been answered. All parties are agreeable.   Manus Rudd

## 2016-01-01 ENCOUNTER — Encounter (HOSPITAL_COMMUNITY): Payer: Self-pay | Admitting: Internal Medicine

## 2016-01-03 ENCOUNTER — Other Ambulatory Visit: Payer: Self-pay | Admitting: Pediatrics

## 2016-01-03 MED ORDER — ATORVASTATIN CALCIUM 10 MG PO TABS: 10.0000 mg | ORAL_TABLET | Freq: Every day | ORAL | 3 refills | Status: DC

## 2016-01-03 NOTE — Telephone Encounter (Signed)
Pharmacy requesting 90 day supply.  I changed rx from 30 days to 90 with appropriate refills to match original rx.

## 2016-01-17 ENCOUNTER — Other Ambulatory Visit: Payer: Self-pay | Admitting: Family Medicine

## 2016-01-23 ENCOUNTER — Encounter: Payer: Self-pay | Admitting: Pediatrics

## 2016-01-23 NOTE — Telephone Encounter (Signed)
This encounter was created in error - please disregard.

## 2016-01-28 ENCOUNTER — Telehealth: Payer: Self-pay | Admitting: *Deleted

## 2016-01-28 NOTE — Telephone Encounter (Signed)
Fax from [facility]. Pt requesting rx for gabapentin 100mg . Not on med list but under med history. [facility] [place].

## 2016-02-02 NOTE — Telephone Encounter (Signed)
#  1 please confirm with the patient is he or is he not taking this medication? If he is taking this medication and needs a refill please send in refill with 6 months been approved. Then make sure it is on Epic list. If it is not please send pharmacy a message that he is no longer on this medicine thank you

## 2016-02-03 ENCOUNTER — Other Ambulatory Visit: Payer: Self-pay | Admitting: *Deleted

## 2016-02-03 MED ORDER — GABAPENTIN 100 MG PO CAPS: 100.0000 mg | ORAL_CAPSULE | Freq: Three times a day (TID) | ORAL | 5 refills | Status: DC

## 2016-02-03 NOTE — Telephone Encounter (Signed)
Med sent to pharm. Pt notified.  

## 2016-02-03 NOTE — Telephone Encounter (Signed)
He may have a 30 day prescription with 5 refills

## 2016-02-03 NOTE — Telephone Encounter (Signed)
Pt states his specialist was filling it. Taking 100mg  one tid. Pt states he is not seeing neurologist now. Stopped it for a couple of months then started back on it.

## 2016-02-04 ENCOUNTER — Encounter: Payer: Self-pay | Admitting: Podiatry

## 2016-02-04 ENCOUNTER — Ambulatory Visit (INDEPENDENT_AMBULATORY_CARE_PROVIDER_SITE_OTHER): Payer: BLUE CROSS/BLUE SHIELD

## 2016-02-04 ENCOUNTER — Ambulatory Visit (INDEPENDENT_AMBULATORY_CARE_PROVIDER_SITE_OTHER): Payer: BLUE CROSS/BLUE SHIELD | Admitting: Podiatry

## 2016-02-04 VITALS — BP 149/80 | HR 78

## 2016-02-04 DIAGNOSIS — M79672 Pain in left foot: Secondary | ICD-10-CM

## 2016-02-04 DIAGNOSIS — M7750 Other enthesopathy of unspecified foot: Secondary | ICD-10-CM

## 2016-02-04 DIAGNOSIS — M79671 Pain in right foot: Secondary | ICD-10-CM

## 2016-02-04 DIAGNOSIS — M778 Other enthesopathies, not elsewhere classified: Secondary | ICD-10-CM

## 2016-02-04 DIAGNOSIS — M779 Enthesopathy, unspecified: Secondary | ICD-10-CM

## 2016-02-04 MED ORDER — DICLOFENAC SODIUM 1 % TD GEL: 4.0000 g | Freq: Four times a day (QID) | TRANSDERMAL | 3 refills | Status: DC

## 2016-02-04 NOTE — Progress Notes (Signed)
   Subjective:    Patient ID: Phillip Mcguire, male    DOB: 1955/02/24, 61 y.o.   MRN: [id number]  HPI: He presents today chief complaint of pain to the forefoot bilaterally stasis been present for quite some time feels like he is walking on a ball beneath the forefoot. He also relates numbness to the knuckles into the toes.    Review of Systems  HENT: Positive for ear pain and tinnitus.   All other systems reviewed and are negative.      Objective:   Physical Exam: Vital signs are stable alert and oriented 3. Pulses are palpable. Neurologic sensorium is intact. Orthopedic evaluation shows all joints distal to the ankle range of motion without crepitation. Cutaneous evaluation shows no open lesions or wounds. He does have pain on end range of motion of the second metatarsophalangeal joints bilaterally and radiographs confirm elongated second metatarsals bilateral today.        Assessment & Plan:  Capsulitis second metatarsophalangeal joints bilateral. Neuritis associated with this.  Plan: I did a periarticular injection today with Kenalog and local anesthetic. Discussed the possible need for orthotics. We also discussed surgery.

## 2016-02-05 ENCOUNTER — Encounter: Payer: Self-pay | Admitting: Internal Medicine

## 2016-02-05 ENCOUNTER — Telehealth: Payer: Self-pay | Admitting: Internal Medicine

## 2016-02-05 NOTE — Telephone Encounter (Signed)
When does patient need another colonoscopy? I can't find the recommendations from his last one done in December 2017

## 2016-02-05 NOTE — Telephone Encounter (Signed)
Dr.Rourk, there is not a path result letter in epic for this pt.

## 2016-03-03 ENCOUNTER — Ambulatory Visit (INDEPENDENT_AMBULATORY_CARE_PROVIDER_SITE_OTHER): Payer: BLUE CROSS/BLUE SHIELD | Admitting: Podiatry

## 2016-03-03 ENCOUNTER — Encounter: Payer: Self-pay | Admitting: Podiatry

## 2016-03-03 DIAGNOSIS — M779 Enthesopathy, unspecified: Principal | ICD-10-CM

## 2016-03-03 DIAGNOSIS — M7750 Other enthesopathy of unspecified foot: Secondary | ICD-10-CM | POA: Diagnosis not present

## 2016-03-03 DIAGNOSIS — M722 Plantar fascial fibromatosis: Secondary | ICD-10-CM

## 2016-03-03 DIAGNOSIS — M778 Other enthesopathies, not elsewhere classified: Secondary | ICD-10-CM

## 2016-03-04 NOTE — Progress Notes (Signed)
Refoel presents today stating that second metatarsophalangeal joint bilateral was doing well for a few days after the injection but then it started to hurt. He is analysis really hurting.  Objective: Tenderness on palpation second metatarsophalangeal joint bilateral.  Assessment: Capsulitis.  Plan: He was scanned for set of orthotics today will follow up with him inject 4 injections next visit.

## 2016-03-20 ENCOUNTER — Telehealth: Payer: Self-pay | Admitting: Family Medicine

## 2016-03-20 DIAGNOSIS — Z0000 Encounter for general adult medical examination without abnormal findings: Secondary | ICD-10-CM

## 2016-03-20 NOTE — Telephone Encounter (Signed)
CBC, lipid, liver, metabolic 7, PSA 

## 2016-03-20 NOTE — Telephone Encounter (Signed)
Patient is wanting to know if he needed labs done for physical 3/22

## 2016-03-20 NOTE — Addendum Note (Signed)
Addended by: Launa Grill on: 03/20/2016 01:38 PM   Modules accepted: Orders

## 2016-03-20 NOTE — Telephone Encounter (Signed)
Spoke with patient and informed him per Theodosia ordered. Patient verbalized understanding.

## 2016-03-28 LAB — CBC WITH DIFFERENTIAL/PLATELET
BASOS ABS: 0 10*3/uL (ref 0.0–0.2)
Basos: 0 %
EOS (ABSOLUTE): 0.1 10*3/uL (ref 0.0–0.4)
EOS: 2 %
Hematocrit: 47.4 % (ref 37.5–51.0)
Hemoglobin: 16.1 g/dL (ref 13.0–17.7)
Immature Grans (Abs): 0 10*3/uL (ref 0.0–0.1)
Immature Granulocytes: 0 %
LYMPHS ABS: 2.9 10*3/uL (ref 0.7–3.1)
LYMPHS: 41 %
MCH: 29.5 pg (ref 26.6–33.0)
MCHC: 34 g/dL (ref 31.5–35.7)
MCV: 87 fL (ref 79–97)
MONOCYTES: 7 %
Monocytes Absolute: 0.5 10*3/uL (ref 0.1–0.9)
NEUTROS ABS: 3.5 10*3/uL (ref 1.4–7.0)
Neutrophils: 50 %
PLATELETS: 240 10*3/uL (ref 150–379)
RBC: 5.46 x10E6/uL (ref 4.14–5.80)
RDW: 13.8 % (ref 12.3–15.4)
WBC: 7.1 10*3/uL (ref 3.4–10.8)

## 2016-03-28 LAB — BASIC METABOLIC PANEL
BUN / CREAT RATIO: 21 (ref 10–24)
BUN: 20 mg/dL (ref 8–27)
CO2: 26 mmol/L (ref 18–29)
Calcium: 10.1 mg/dL (ref 8.6–10.2)
Chloride: 99 mmol/L (ref 96–106)
Creatinine, Ser: 0.96 mg/dL (ref 0.76–1.27)
GFR, EST AFRICAN AMERICAN: 99 mL/min/{1.73_m2} (ref >59)
GFR, EST NON AFRICAN AMERICAN: 86 mL/min/{1.73_m2} (ref >59)
Glucose: 96 mg/dL (ref 65–99)
POTASSIUM: 4.4 mmol/L (ref 3.5–5.2)
SODIUM: 141 mmol/L (ref 134–144)

## 2016-03-28 LAB — LIPID PANEL
CHOLESTEROL TOTAL: 152 mg/dL (ref 100–199)
Chol/HDL Ratio: 3.1 ratio units (ref 0.0–5.0)
HDL: 49 mg/dL (ref >39)
LDL Calculated: 79 mg/dL (ref 0–99)
Triglycerides: 119 mg/dL (ref 0–149)
VLDL Cholesterol Cal: 24 mg/dL (ref 5–40)

## 2016-03-28 LAB — HEPATIC FUNCTION PANEL
ALBUMIN: 4.6 g/dL (ref 3.6–4.8)
ALK PHOS: 91 IU/L (ref 39–117)
ALT: 29 IU/L (ref 0–44)
AST: 29 IU/L (ref 0–40)
BILIRUBIN TOTAL: 2.1 mg/dL — AB (ref 0.0–1.2)
BILIRUBIN, DIRECT: 0.36 mg/dL (ref 0.00–0.40)
Total Protein: 7.3 g/dL (ref 6.0–8.5)

## 2016-03-28 LAB — PSA: Prostate Specific Ag, Serum: 1.1 ng/mL (ref 0.0–4.0)

## 2016-03-31 ENCOUNTER — Ambulatory Visit (INDEPENDENT_AMBULATORY_CARE_PROVIDER_SITE_OTHER): Payer: BLUE CROSS/BLUE SHIELD | Admitting: Podiatry

## 2016-03-31 DIAGNOSIS — M778 Other enthesopathies, not elsewhere classified: Secondary | ICD-10-CM

## 2016-03-31 DIAGNOSIS — M779 Enthesopathy, unspecified: Principal | ICD-10-CM

## 2016-03-31 DIAGNOSIS — M7750 Other enthesopathy of unspecified foot: Secondary | ICD-10-CM

## 2016-03-31 NOTE — Progress Notes (Signed)
He presents today for follow-up of his capsulitis second metatarsophalangeal joint of the right foot. He also is here to pick up his orthotics. He states that the joint really hasn't changed much at all. Thus, saw him was 1 month ago.  Objective: Vital signs are stable alert and oriented 3. Pulses are palpable. His pain on end range of motion of the second metatarsophalangeal joint was in contracture deformity.  Assessment: Capsulitis second metatarsophalangeal joint.  Plan: Injected these areas today right foot with dexamethasone and local anesthetic started him on the use of his orthotics and will follow up with him in 4-6 weeks.

## 2016-03-31 NOTE — Patient Instructions (Signed)

## 2016-04-02 ENCOUNTER — Encounter: Payer: Self-pay | Admitting: Family Medicine

## 2016-04-02 ENCOUNTER — Ambulatory Visit (INDEPENDENT_AMBULATORY_CARE_PROVIDER_SITE_OTHER): Payer: BLUE CROSS/BLUE SHIELD | Admitting: Family Medicine

## 2016-04-02 VITALS — BP 118/68 | Ht 71.0 in | Wt 225.2 lb

## 2016-04-02 DIAGNOSIS — Z0000 Encounter for general adult medical examination without abnormal findings: Secondary | ICD-10-CM | POA: Diagnosis not present

## 2016-04-02 NOTE — Progress Notes (Signed)
   Subjective:    Patient ID: Phillip Mcguire, male    DOB: August 01, 1955, 60 y.o.   MRN: [id number]  HPI The patient comes in today for a wellness visit.    A review of their health history was completed.  A review of medications was also completed.  Any needed refills; no  Eating habits: trying eating healthy  Falls/  MVA accidents in past few months: none  Regular exercise: walking  Specialist pt sees on regular basis: foot doctor, ear doctor  Preventative health issues were discussed.   Additional concerns: none Discussion today was regarding his overall health he is eating healthy stay physically active. He does a good job with his medications. He does a good job with safety. Denies rectal bleeding or hematuria. Discussion regarding shingles vaccine held in detail Patient denies any chest tightness pressure pain with activity   Review of Systems  Constitutional: Negative for activity change, appetite change and fever.  HENT: Negative for congestion and rhinorrhea.   Eyes: Negative for discharge.  Respiratory: Negative for cough and wheezing.   Cardiovascular: Negative for chest pain.  Gastrointestinal: Negative for abdominal pain, blood in stool and vomiting.  Genitourinary: Negative for difficulty urinating and frequency.  Musculoskeletal: Negative for neck pain.  Skin: Negative for rash.  Allergic/Immunologic: Negative for environmental allergies and food allergies.  Neurological: Negative for weakness and headaches.  Psychiatric/Behavioral: Negative for agitation.       Objective:   Physical Exam  Constitutional: He appears well-developed and well-nourished.  HENT:  Head: Normocephalic and atraumatic.  Right Ear: External ear normal.  Left Ear: External ear normal.  Nose: Nose normal.  Mouth/Throat: Oropharynx is clear and moist.  Eyes: EOM are normal. Pupils are equal, round, and reactive to light.  Neck: Normal range of motion. Neck supple. No thyromegaly  present.  Cardiovascular: Normal rate, regular rhythm and normal heart sounds.   No murmur heard. Pulmonary/Chest: Effort normal and breath sounds normal. No respiratory distress. He has no wheezes.  Abdominal: Soft. Bowel sounds are normal. He exhibits no distension and no mass. There is no tenderness.  Genitourinary: Penis normal.  Musculoskeletal: Normal range of motion. He exhibits no edema.  Lymphadenopathy:    He has no cervical adenopathy.  Neurological: He is alert. He exhibits normal muscle tone.  Skin: Skin is warm and dry. No erythema.  Psychiatric: He has a normal mood and affect. His behavior is normal. Judgment normal.  Patient denies depression no falls  Dry hands-lotion recommended Prostate exam normal Labs reviewed with patient PSA good kidney function liver function good blood count good cholesterol looks good    Assessment & Plan:  Adult wellness-complete.wellness physical was conducted today. Importance of diet and exercise were discussed in detail. In addition to this a discussion regarding safety was also covered. We also reviewed over immunizations and gave recommendations regarding current immunization needed for age. In addition to this additional areas were also touched on including: Preventative health exams needed: Colonoscopy up-to-date  Patient was advised yearly wellness exam  Lab work shows cholesterol under good control continue with medication already has refills from previous visit follow-up 6 months at that time check lipid liver including hep C antibody HIV antibody for screening

## 2016-05-12 ENCOUNTER — Encounter: Payer: Self-pay | Admitting: Podiatry

## 2016-05-12 ENCOUNTER — Ambulatory Visit (INDEPENDENT_AMBULATORY_CARE_PROVIDER_SITE_OTHER): Payer: BLUE CROSS/BLUE SHIELD | Admitting: Podiatry

## 2016-05-12 DIAGNOSIS — M779 Enthesopathy, unspecified: Principal | ICD-10-CM

## 2016-05-12 DIAGNOSIS — M778 Other enthesopathies, not elsewhere classified: Secondary | ICD-10-CM

## 2016-05-12 DIAGNOSIS — M7750 Other enthesopathy of unspecified foot: Secondary | ICD-10-CM

## 2016-05-12 DIAGNOSIS — G5790 Unspecified mononeuropathy of unspecified lower limb: Secondary | ICD-10-CM

## 2016-05-12 MED ORDER — GABAPENTIN 100 MG PO CAPS: ORAL_CAPSULE | ORAL | 3 refills | Status: DC

## 2016-05-12 NOTE — Progress Notes (Signed)
He presents today for follow-up of burning in the forefoot. He states that he takes gabapentin at night which seems to help he takes 200 mg at nighttime.  Objective: Vital signs are stable alert and oriented 3. Pulses are palpable. No change in physical exam.  Assessment: Neuritis capsulitis forefoot bilateral.  Plan: I increased his gabapentin to 3 times a day one for breakfast 1 for lunch and 2 at bedtime. Still 100 mg of gabapentin per pill. Follow up with him in 1 month.

## 2016-05-23 ENCOUNTER — Other Ambulatory Visit: Payer: Self-pay | Admitting: Family Medicine

## 2016-06-23 ENCOUNTER — Ambulatory Visit (INDEPENDENT_AMBULATORY_CARE_PROVIDER_SITE_OTHER): Payer: BLUE CROSS/BLUE SHIELD | Admitting: Podiatry

## 2016-06-23 ENCOUNTER — Encounter: Payer: Self-pay | Admitting: Podiatry

## 2016-06-23 DIAGNOSIS — G5790 Unspecified mononeuropathy of unspecified lower limb: Secondary | ICD-10-CM | POA: Diagnosis not present

## 2016-06-23 DIAGNOSIS — D3610 Benign neoplasm of peripheral nerves and autonomic nervous system, unspecified: Secondary | ICD-10-CM

## 2016-06-23 NOTE — Progress Notes (Signed)
He presents today for follow-up of neuritis capsulitis second metatarsophalangeal joints bilateral. He states they're doing a little better.  Objective: Vital signs are stable and oriented 3. Continued pain to the second intermetatarsal space bilateral.  Assessment: Neuritis capsulitis second metatarsophalangeal joint and second interdigital space right foot.  Plan: I injected the area today with dehydrated alcohol bilaterally. Follow up with him in 3 weeks.

## 2016-07-06 ENCOUNTER — Ambulatory Visit (INDEPENDENT_AMBULATORY_CARE_PROVIDER_SITE_OTHER): Payer: BLUE CROSS/BLUE SHIELD | Admitting: Family Medicine

## 2016-07-06 ENCOUNTER — Encounter: Payer: Self-pay | Admitting: Family Medicine

## 2016-07-06 VITALS — BP 116/74 | Temp 98.0°F | Ht 71.0 in | Wt 226.0 lb

## 2016-07-06 DIAGNOSIS — J019 Acute sinusitis, unspecified: Secondary | ICD-10-CM | POA: Diagnosis not present

## 2016-07-06 MED ORDER — ALBUTEROL SULFATE HFA 108 (90 BASE) MCG/ACT IN AERS: 2.0000 | INHALATION_SPRAY | Freq: Four times a day (QID) | RESPIRATORY_TRACT | 2 refills | Status: DC | PRN

## 2016-07-06 MED ORDER — FLUTICASONE PROPIONATE 50 MCG/ACT NA SUSP: 2.0000 | Freq: Every day | NASAL | 5 refills | Status: DC

## 2016-07-06 MED ORDER — AMOXICILLIN-POT CLAVULANATE 875-125 MG PO TABS: 1.0000 | ORAL_TABLET | Freq: Two times a day (BID) | ORAL | 0 refills | Status: DC

## 2016-07-06 NOTE — Progress Notes (Signed)
   Subjective:    Patient ID: Phillip Mcguire, male    DOB: 15-May-1955, 61 y.o.   MRN: [id number]  Sinusitis  This is a new problem. Episode onset: 2 weeks. Associated symptoms include coughing, ear pain and headaches. (Wheezing) Treatments tried: otc meds.  Viral syndrome over the past couple weeks head congestion drainage sinus pressure pain discomfort frontal sinuses a little bit of chest congestion and at times feels like has difficult time taking a deep breath and chest related to the heat and humidity along with a recent illness was not having this problem beforehand.  Patient did have chest wall discomfort several months back did do a stress test which was negative    Review of Systems  HENT: Positive for ear pain.   Respiratory: Positive for cough.   Neurological: Positive for headaches.       Objective:   Physical Exam  Constitutional: He appears well-developed.  HENT:  Head: Normocephalic.  Mouth/Throat: Oropharynx is clear and moist. No oropharyngeal exudate.  Neck: Normal range of motion.  Cardiovascular: Normal rate, regular rhythm and normal heart sounds.   No murmur heard. Pulmonary/Chest: Effort normal and breath sounds normal. He has no wheezes.  Lymphadenopathy:    He has no cervical adenopathy.  Neurological: He exhibits normal muscle tone.  Skin: Skin is warm and dry.  Nursing note and vitals reviewed.   Patient not in any respiratory distress mild frontal sinus tenderness      Assessment & Plan:  Sinusitis Augmentin 10 days Allergy medicines Flonase on a regular basis Albuterol when necessary Should gradually get better.

## 2016-07-14 ENCOUNTER — Encounter: Payer: Self-pay | Admitting: Podiatry

## 2016-07-14 ENCOUNTER — Ambulatory Visit (INDEPENDENT_AMBULATORY_CARE_PROVIDER_SITE_OTHER): Payer: BLUE CROSS/BLUE SHIELD | Admitting: Podiatry

## 2016-07-14 DIAGNOSIS — D3610 Benign neoplasm of peripheral nerves and autonomic nervous system, unspecified: Secondary | ICD-10-CM

## 2016-07-14 NOTE — Progress Notes (Signed)
He presents today for follow-up of his neuroma second interdigital space bilateral. He states that is doing a lot better.  Objective: Vital signs are stable alert and oriented 3. Pulses are palpable. He has hardly any pain on palpation to the second interdigital space bilateral. No open lesions or wounds are noted. No calf pain.  Assessment: Resolving neuroma second interdigital space bilateral.  Plan: I reinjected another dose of dehydrated alcohol today will follow up with him in 3 weeks if necessary.

## 2016-07-22 ENCOUNTER — Other Ambulatory Visit: Payer: Self-pay | Admitting: Family Medicine

## 2016-07-24 ENCOUNTER — Encounter: Payer: Self-pay | Admitting: Internal Medicine

## 2016-07-24 ENCOUNTER — Telehealth: Payer: Self-pay | Admitting: Internal Medicine

## 2016-07-24 MED ORDER — SUCRALFATE 1 GM/10ML PO SUSP: 1.0000 g | Freq: Three times a day (TID) | ORAL | 0 refills | Status: DC

## 2016-07-24 MED ORDER — ESOMEPRAZOLE MAGNESIUM 40 MG PO CPDR: 40.0000 mg | DELAYED_RELEASE_CAPSULE | Freq: Two times a day (BID) | ORAL | 1 refills | Status: DC

## 2016-07-24 NOTE — Telephone Encounter (Signed)
Patient will need an appointment, we have not seen him since 12/2015 for screening tcs.   In interim, I would advise increasing his Nexium to BID, 30 minutes before breakfast and before evening meal. RX sent. We can also try 2 weeks course of carafate before meals and at bedtime. RX sent.   If pain is severe, associated with sweating, shortness of breath, radiation down the arms, he should go to the ER.

## 2016-07-24 NOTE — Telephone Encounter (Signed)
Pt called to make OV to see RMR ASAP. Pt was told that we were into our Sept schedule and he wanted to speak with nurse to see what RMR would recommend. I transferred call to JL VM

## 2016-07-24 NOTE — Telephone Encounter (Signed)
OV made and letter mailed °

## 2016-07-24 NOTE — Telephone Encounter (Signed)
Pt is aware. rx's were sent to [facility], pt requested they be sent to [facility]. I have called in the rx's to [facility] and left on their voicemail.  Manuela Schwartz, please schedule ov.

## 2016-07-24 NOTE — Telephone Encounter (Signed)
Spoke with the pt, he started having an increase in his heartburn about 2 weeks ago. He is taking his reflux medication- nexium. He has not added any new medications or started any new activities. He wakes up fine but the heartburn starts right after breakfast and lasts all day. Even water will give him heartburn. Pt wanted to know if we had any recommendations? [phone]

## 2016-08-04 ENCOUNTER — Encounter: Payer: Self-pay | Admitting: Podiatry

## 2016-08-04 ENCOUNTER — Ambulatory Visit (INDEPENDENT_AMBULATORY_CARE_PROVIDER_SITE_OTHER): Payer: BLUE CROSS/BLUE SHIELD | Admitting: Podiatry

## 2016-08-04 DIAGNOSIS — D3610 Benign neoplasm of peripheral nerves and autonomic nervous system, unspecified: Secondary | ICD-10-CM

## 2016-08-05 NOTE — Progress Notes (Signed)
He presents today for follow-up of his neuroma third interdigital place and second interdigital space bilateral. He states they are doing a little better. He states they are about 50-60% improvement.  Objective: Pulses are palpable much less pain on palpation he does have pain on palpation to the third interdigital space today as opposed to the second interdigital space.  Assessment: Neuroma second and third interdigital space. Third is more painful today than the second.  Plan: I injected another dose of dehydrated alcohol into the third interdigital space today we'll reevaluate second interdigital space next visit

## 2016-08-25 ENCOUNTER — Encounter: Payer: Self-pay | Admitting: Podiatry

## 2016-08-25 ENCOUNTER — Ambulatory Visit (INDEPENDENT_AMBULATORY_CARE_PROVIDER_SITE_OTHER): Payer: BLUE CROSS/BLUE SHIELD | Admitting: Podiatry

## 2016-08-25 DIAGNOSIS — D3610 Benign neoplasm of peripheral nerves and autonomic nervous system, unspecified: Secondary | ICD-10-CM

## 2016-08-25 NOTE — Progress Notes (Signed)
He presents today for a follow-up of his neuroma third interdigital space bilateral. States that they're okay for a while and then I got new shoes and he feels that a certain burn on my forefoot once again.  Objective: Vital signs are stable alert and oriented 3 pulses are palpable. Neurologic sensorium is intact to has palpable click to the second and third interdigital spaces bilaterally.  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  Plan: Injected second and third interdigital space with a single cc of dehydrated alcohol to each location. Follow up in 3 weeks.

## 2016-09-09 ENCOUNTER — Ambulatory Visit: Payer: Self-pay | Admitting: Nurse Practitioner

## 2016-09-17 ENCOUNTER — Ambulatory Visit (INDEPENDENT_AMBULATORY_CARE_PROVIDER_SITE_OTHER): Payer: BLUE CROSS/BLUE SHIELD | Admitting: Podiatry

## 2016-09-17 ENCOUNTER — Encounter: Payer: Self-pay | Admitting: Podiatry

## 2016-09-17 DIAGNOSIS — D3610 Benign neoplasm of peripheral nerves and autonomic nervous system, unspecified: Secondary | ICD-10-CM

## 2016-09-17 NOTE — Progress Notes (Signed)
He presents today for his 3 week follow-up regarding his neurologic status to the third interdigital space bilaterally. He states this seems to be doing much improved. He states it is still having tenderness and numbness in the second interdigital space as well as the plantar forefoot.  Objective: Pulses are palpable. He has no palpable click to the third interdigital space and no pain bilaterally. He does however have a palpable click to the second interdigital space bilaterally with numbness on the plantar aspect of the forefoot bilaterally.  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  Plan: At this point I reinjected his second interdigital space bilateral and wrote a prescription for a compounding agents for his neuropathy. Follow-up with him in 1 month.

## 2016-09-18 ENCOUNTER — Other Ambulatory Visit: Payer: Self-pay | Admitting: *Deleted

## 2016-09-18 MED ORDER — GABAPENTIN 100 MG PO CAPS: 100.0000 mg | ORAL_CAPSULE | Freq: Three times a day (TID) | ORAL | 5 refills | Status: DC

## 2016-09-26 ENCOUNTER — Other Ambulatory Visit: Payer: Self-pay | Admitting: Family Medicine

## 2016-09-30 ENCOUNTER — Telehealth: Payer: Self-pay | Admitting: *Deleted

## 2016-09-30 NOTE — Telephone Encounter (Addendum)
Refill request for Gabapentin 100mg  1 in am, 1 at lunch and 2 at hs, did not match 09/18/2016 orders. Refill request given to Dr. Milinda Pointer for clarification. 10/01/2016-DrMilinda Pointer states pt is to take the Gabapentin 100mg  one capsule in the morning, one capsule at lunch, and two at bedtime #120 +11refills. Orders escribed.

## 2016-10-01 MED ORDER — GABAPENTIN 100 MG PO CAPS: ORAL_CAPSULE | ORAL | 11 refills | Status: DC

## 2016-10-07 ENCOUNTER — Ambulatory Visit: Payer: BLUE CROSS/BLUE SHIELD | Admitting: Family Medicine

## 2016-10-14 ENCOUNTER — Ambulatory Visit: Payer: BLUE CROSS/BLUE SHIELD | Admitting: Family Medicine

## 2016-10-19 ENCOUNTER — Encounter: Payer: Self-pay | Admitting: Family Medicine

## 2016-10-19 ENCOUNTER — Ambulatory Visit (INDEPENDENT_AMBULATORY_CARE_PROVIDER_SITE_OTHER): Payer: BLUE CROSS/BLUE SHIELD | Admitting: Family Medicine

## 2016-10-19 VITALS — BP 110/70 | Temp 98.1°F | Ht 71.0 in | Wt 220.0 lb

## 2016-10-19 DIAGNOSIS — Z79899 Other long term (current) drug therapy: Secondary | ICD-10-CM

## 2016-10-19 DIAGNOSIS — K219 Gastro-esophageal reflux disease without esophagitis: Secondary | ICD-10-CM | POA: Diagnosis not present

## 2016-10-19 DIAGNOSIS — E7849 Other hyperlipidemia: Secondary | ICD-10-CM

## 2016-10-19 MED ORDER — ESOMEPRAZOLE MAGNESIUM 40 MG PO CPDR: DELAYED_RELEASE_CAPSULE | ORAL | 3 refills | Status: DC

## 2016-10-19 MED ORDER — AMOXICILLIN 500 MG PO TABS: 500.0000 mg | ORAL_TABLET | Freq: Three times a day (TID) | ORAL | 0 refills | Status: DC

## 2016-10-19 MED ORDER — ALBUTEROL SULFATE HFA 108 (90 BASE) MCG/ACT IN AERS: 2.0000 | INHALATION_SPRAY | Freq: Four times a day (QID) | RESPIRATORY_TRACT | 2 refills | Status: DC | PRN

## 2016-10-19 NOTE — Progress Notes (Signed)
   Subjective:    Patient ID: Phillip Mcguire, male    DOB: 31-Jul-1955, 61 y.o.   MRN: [id number]  Hyperlipidemia  This is a chronic problem. Pertinent negatives include no chest pain. Compliance problems: eats healthy, exercises.   Patient relates he does try to watch his diet try stay physically active takes his medicines as directed Sinus drainage, cough, headache. Started a couple weeks ago. Tried otc cold meds.    Review of Systems  Constitutional: Negative for activity change, appetite change, fatigue and fever.  HENT: Positive for rhinorrhea. Negative for congestion and ear pain.   Eyes: Negative for discharge.  Respiratory: Positive for cough. Negative for wheezing.   Cardiovascular: Negative for chest pain.  Gastrointestinal: Negative for abdominal pain.  Endocrine: Negative for polydipsia and polyphagia.  Neurological: Negative for weakness.  Psychiatric/Behavioral: Negative for confusion.       Objective:   Physical Exam  Constitutional: He appears well-nourished. No distress.  Cardiovascular: Normal rate, regular rhythm and normal heart sounds.   No murmur heard. Pulmonary/Chest: Effort normal and breath sounds normal. No respiratory distress.  Musculoskeletal: He exhibits no edema.  Lymphadenopathy:    He has no cervical adenopathy.  Neurological: He is alert.  Psychiatric: His behavior is normal.  Vitals reviewed.         Assessment & Plan:  Viral URI Secondary rhinosinusitis Antibiotics prescribed warning signs discuss  Hyperlipidemia check lipid profile patient encouraged on healthy diet  Follow-up 6 months for comprehensive work in physical  Doing well on Nexium continue this

## 2016-10-20 ENCOUNTER — Encounter: Payer: Self-pay | Admitting: Podiatry

## 2016-10-20 ENCOUNTER — Ambulatory Visit (INDEPENDENT_AMBULATORY_CARE_PROVIDER_SITE_OTHER): Payer: BLUE CROSS/BLUE SHIELD | Admitting: Podiatry

## 2016-10-20 DIAGNOSIS — D3610 Benign neoplasm of peripheral nerves and autonomic nervous system, unspecified: Secondary | ICD-10-CM

## 2016-10-20 DIAGNOSIS — L603 Nail dystrophy: Secondary | ICD-10-CM

## 2016-10-20 LAB — HEPATIC FUNCTION PANEL
ALBUMIN: 4.5 g/dL (ref 3.6–4.8)
ALK PHOS: 85 IU/L (ref 39–117)
ALT: 25 IU/L (ref 0–44)
AST: 22 IU/L (ref 0–40)
Bilirubin Total: 1.2 mg/dL (ref 0.0–1.2)
Bilirubin, Direct: 0.25 mg/dL (ref 0.00–0.40)
TOTAL PROTEIN: 7 g/dL (ref 6.0–8.5)

## 2016-10-20 NOTE — Addendum Note (Signed)
Addended by: Ofilia Neas R on: 10/20/2016 02:48 PM   Modules accepted: Orders

## 2016-10-21 NOTE — Progress Notes (Signed)
He presents today for follow-up of his neuroma third interdigital space bilaterally. He is also concerned about the thickness of his toenails.  Objective: Vital signs are stable he is alert and oriented 3 in no longer has pain on palpation to the third interdigital space however he does have some tenderness on palpation to the second interdigital space of the right foot. Third interdigital space of the left foot. He still somewhat tender but not nearly as bad as it has been passed. His toenails are thick yellow dystrophic sharply incurvated possibly mycotic.  Assessment: Nail dystrophy possible onychomycosis. Resolving neuroma third interdigital space right. Neuroma second interdigital space right. Neuroma third interdigital space left.  Plan: Injected the second interdigital space today with dehydrated alcohol right foot injected the third interdigital space today left foot with dehydrated alcohol. Also took samples of the nails to be sent for pathologic evaluation.

## 2016-11-02 ENCOUNTER — Telehealth: Payer: Self-pay | Admitting: *Deleted

## 2016-11-02 MED ORDER — LEVOFLOXACIN 500 MG PO TABS: 500.0000 mg | ORAL_TABLET | Freq: Every day | ORAL | 0 refills | Status: AC

## 2016-11-02 NOTE — Telephone Encounter (Signed)
Patient walked into the office after giving blood next door. Patient states he was told to let the doctor know if he wasn't feeling any better since he was here 10/19/16 and they would call something else in for him. Patient states he is still not better, patient states the medicine he was taking makes him cough and nothing comes up, patient states he is still having congestion. Please advise [phone]

## 2016-11-02 NOTE — Telephone Encounter (Signed)
It would be reasonable to use Levaquin 500 milligrams 1 daily for 7 days, also recommend that the patient use OTC measures 4 allergies on a daily basis either generic Claritin or generic Zyrtec. Also he can use generic Flonase on a daily basis as well if ongoing troubles follow-up

## 2016-11-02 NOTE — Telephone Encounter (Addendum)
Prescription sent electronically to pharmacy  Left message to return call 

## 2016-11-03 ENCOUNTER — Encounter: Payer: Self-pay | Admitting: Family Medicine

## 2016-11-03 LAB — LIPID PANEL
CHOL/HDL RATIO: 2.8 ratio (ref 0.0–5.0)
Cholesterol, Total: 151 mg/dL (ref 100–199)
HDL: 53 mg/dL (ref >39)
LDL CALC: 80 mg/dL (ref 0–99)
TRIGLYCERIDES: 88 mg/dL (ref 0–149)
VLDL Cholesterol Cal: 18 mg/dL (ref 5–40)

## 2016-11-03 NOTE — Telephone Encounter (Signed)
Patient advised It would be reasonable to use Levaquin 500 milligrams 1 daily for 7 days, also recommend that the patient use OTC measures 4 allergies on a daily basis either generic Claritin or generic Zyrtec. Also he can use generic Flonase on a daily basis as well if ongoing troubles follow-up. Prescription sent electronically to pharmacy. Patient verbalized understanding.

## 2016-11-03 NOTE — Telephone Encounter (Signed)
Left message to return call 

## 2016-11-14 ENCOUNTER — Other Ambulatory Visit: Payer: Self-pay | Admitting: Family Medicine

## 2016-11-16 NOTE — Telephone Encounter (Signed)
Medication is no longer on patient's med list. Please advise?

## 2016-11-17 ENCOUNTER — Encounter: Payer: Self-pay | Admitting: Podiatry

## 2016-11-17 ENCOUNTER — Ambulatory Visit: Payer: BLUE CROSS/BLUE SHIELD | Admitting: Podiatry

## 2016-11-17 DIAGNOSIS — Z79899 Other long term (current) drug therapy: Secondary | ICD-10-CM

## 2016-11-17 DIAGNOSIS — L603 Nail dystrophy: Secondary | ICD-10-CM | POA: Diagnosis not present

## 2016-11-17 DIAGNOSIS — D3610 Benign neoplasm of peripheral nerves and autonomic nervous system, unspecified: Secondary | ICD-10-CM

## 2016-11-17 MED ORDER — TERBINAFINE HCL 250 MG PO TABS: 250.0000 mg | ORAL_TABLET | Freq: Every day | ORAL | 0 refills | Status: DC

## 2016-11-17 NOTE — Patient Instructions (Signed)

## 2016-11-17 NOTE — Telephone Encounter (Signed)
My knowledge I believe the patient restarted his medicine please verify with the patient is he in fact taking the medicine and if so he may have 6 months worth of refills

## 2016-11-18 NOTE — Progress Notes (Signed)
He presents today stating that his neuromas are approximately 80-85% resolved and he refers to the third interdigital space bilaterally. He also presents for histopathology report regarding his toenails.  Objective: Vital signs are stable he is alert and oriented 3 retained some pain on palpation to the third interdigital space bilaterally. Much decreased from previous evaluation. Pathology report does demonstrate onychomycosis.  Assessment: Onychomycosis bilateral. Neuroma resolve 85% bilateral third interdigital space.  Plan: Reinjected the third interdigital space today with 2 mL dehydrated alcohol and local anesthetic. I also started him on terbinafine 250 mg tablets 1 a day for the next 30 days requesting blood work immediately and then to follow with 30 days. We will notify him as to the results of his liver profile.

## 2016-12-17 ENCOUNTER — Ambulatory Visit: Payer: BLUE CROSS/BLUE SHIELD | Admitting: Podiatry

## 2016-12-25 ENCOUNTER — Ambulatory Visit: Payer: BLUE CROSS/BLUE SHIELD | Admitting: Nurse Practitioner

## 2016-12-25 ENCOUNTER — Encounter: Payer: Self-pay | Admitting: Nurse Practitioner

## 2016-12-25 VITALS — BP 114/70 | Temp 98.0°F | Ht 71.0 in | Wt 221.0 lb

## 2016-12-25 DIAGNOSIS — J0120 Acute ethmoidal sinusitis, unspecified: Secondary | ICD-10-CM

## 2016-12-25 DIAGNOSIS — J209 Acute bronchitis, unspecified: Secondary | ICD-10-CM | POA: Diagnosis not present

## 2016-12-25 MED ORDER — PREDNISONE 20 MG PO TABS: ORAL_TABLET | ORAL | 0 refills | Status: DC

## 2016-12-25 MED ORDER — AMOXICILLIN-POT CLAVULANATE 875-125 MG PO TABS: 1.0000 | ORAL_TABLET | Freq: Two times a day (BID) | ORAL | 0 refills | Status: DC

## 2016-12-25 MED ORDER — HYDROCODONE-HOMATROPINE 5-1.5 MG/5ML PO SYRP: 5.0000 mL | ORAL_SOLUTION | ORAL | 0 refills | Status: DC | PRN

## 2016-12-26 ENCOUNTER — Encounter: Payer: Self-pay | Admitting: Nurse Practitioner

## 2016-12-26 NOTE — Progress Notes (Signed)
Subjective: Presents for complaints of sinus symptoms over the past 5 days.  No fever.  Sore throat.  Ethmoid sinus area headache.  Runny nose.  Frequent cough producing yellow mucus.  Slight wheezing, has not used his albuterol inhaler.  Ear pressure.  No relief with OTC meds.  Objective:   BP 114/70   Temp 98 F (36.7 C) (Oral)   Ht 5\' 11"  (1.803 m)   Wt 221 lb (100.2 kg)   BMI 30.82 kg/m  NAD.  Alert, oriented.  TMs retracted, no erythema.  Posterior pharynx injected with green PND noted.  Neck supple with mild soft anterior adenopathy.  Lungs scattered coarse expiratory crackles, no wheezing or tachypnea.  Heart regular rate and rhythm.  Assessment:  Acute non-recurrent ethmoidal sinusitis  Acute bronchitis, unspecified organism    Plan:   Meds ordered this encounter  Medications  . amoxicillin-clavulanate (AUGMENTIN) 875-125 MG tablet    Sig: Take 1 tablet by mouth 2 (two) times daily.    Dispense:  20 tablet    Refill:  0    Order Specific Question:   Supervising Provider    Answer:   Mikey Kirschner [[id number]]  . HYDROcodone-homatropine (HYCODAN) 5-1.5 MG/5ML syrup    Sig: Take 5 mLs by mouth every 4 (four) hours as needed.    Dispense:  90 mL    Refill:  0    Order Specific Question:   Supervising Provider    Answer:   Mikey Kirschner [[id number]]  . predniSONE (DELTASONE) 20 MG tablet    Sig: 3 po qd x 3 d then 2 po qd x 3 d then 1 po qd x 2 d    Dispense:  17 tablet    Refill:  0    Order Specific Question:   Supervising Provider    Answer:   Maggie Font   Given written prescription for prednisone to have on hand over the weekend in case worsening wheezing.  Continue OTC meds for daytime, hydrocodone cough syrup for nighttime.  Warning signs reviewed.  Call back if symptoms worsen or persist, go to ED or urgent care sooner if worse over the weekend.

## 2016-12-29 ENCOUNTER — Telehealth: Payer: Self-pay | Admitting: Family Medicine

## 2016-12-29 NOTE — Telephone Encounter (Signed)
Patient states they do provide his CPAP supplies and he did request this. Form completed and faxed.

## 2016-12-29 NOTE — Telephone Encounter (Signed)
Please inform the patient that we received a request for CPAP supplies from [facility] out of [place].-Please find out from the patient is this legitimate request?  See form

## 2016-12-31 ENCOUNTER — Ambulatory Visit: Payer: BLUE CROSS/BLUE SHIELD | Admitting: Podiatry

## 2017-01-06 ENCOUNTER — Telehealth: Payer: Self-pay | Admitting: Family Medicine

## 2017-01-06 ENCOUNTER — Other Ambulatory Visit: Payer: Self-pay | Admitting: *Deleted

## 2017-01-06 MED ORDER — CEFDINIR 300 MG PO CAPS: 300.0000 mg | ORAL_CAPSULE | Freq: Two times a day (BID) | ORAL | 0 refills | Status: DC

## 2017-01-06 MED ORDER — HYDROCODONE-HOMATROPINE 5-1.5 MG/5ML PO SYRP: 5.0000 mL | ORAL_SOLUTION | ORAL | 0 refills | Status: DC | PRN

## 2017-01-06 NOTE — Telephone Encounter (Signed)
I spoke with the pt and he states he is still having some runny nose,cough some times it is productive other times it is not,has some wheezing,but breathing is much better he states he has an inhaler for that. He was given Augmentin 875-125 one BID and finished it a couple of days ago also given Hycodan 5 ml Q 4 hours prn cough. He would like for [place] to send in a refill on the both of these or something stronger to knock it out.No fever. Wants sent to [facility]. Please advise.

## 2017-01-06 NOTE — Telephone Encounter (Signed)
Antibiotic sent in and hycodan printed. Pt notified on voicemail that a rx was sent in and for pt to call back so we could let him know to pick up the hycodan at the office. Hycodan rx up front for pickup.

## 2017-01-06 NOTE — Telephone Encounter (Signed)
Patient was here to see Hoyle Sauer on the 14th. Finished his antibiotic.  All symptoms have started coming back, only got a little better while on meds.  Wanted to see if he could get a refill on antibiotic (or a stronger one) [facility] [place]  [phone]

## 2017-01-06 NOTE — Telephone Encounter (Signed)
omnicef 300 bid ten d  Refill hycodan

## 2017-01-07 NOTE — Telephone Encounter (Signed)
Pt notified antibiotic sent to pharm and he needs to pick up cough meds at the office. Pt verbalized understanding.

## 2017-04-02 ENCOUNTER — Ambulatory Visit: Payer: BLUE CROSS/BLUE SHIELD | Admitting: Nurse Practitioner

## 2017-04-02 ENCOUNTER — Encounter: Payer: Self-pay | Admitting: Nurse Practitioner

## 2017-04-02 VITALS — BP 120/80 | Temp 98.1°F | Ht 71.0 in | Wt 226.4 lb

## 2017-04-02 DIAGNOSIS — K219 Gastro-esophageal reflux disease without esophagitis: Secondary | ICD-10-CM

## 2017-04-02 DIAGNOSIS — R7303 Prediabetes: Secondary | ICD-10-CM

## 2017-04-02 LAB — POCT GLYCOSYLATED HEMOGLOBIN (HGB A1C): HEMOGLOBIN A1C: 5.7

## 2017-04-02 LAB — POCT HEMOGLOBIN: HEMOGLOBIN: 15.6 g/dL (ref 14.1–18.1)

## 2017-04-02 MED ORDER — DEXLANSOPRAZOLE 60 MG PO CPDR: 60.0000 mg | DELAYED_RELEASE_CAPSULE | Freq: Every day | ORAL | 2 refills | Status: DC

## 2017-04-02 MED ORDER — SUCRALFATE 1 G PO TABS: 1.0000 g | ORAL_TABLET | Freq: Three times a day (TID) | ORAL | 0 refills | Status: DC

## 2017-04-02 NOTE — Progress Notes (Signed)
Subjective:    Patient ID: Phillip Mcguire, male    DOB: [birth year], 62 y.o.   MRN: [id number]  HPI Patient presents today after two weeks of abd discomfort. The patient reports after eating a meal, he begins to have epigastric burning and tightness accompanied with gas and belching. He has tried tums without any relief. Pt denies nausea and vomiting. Patient reports regular daily bowel movements with occasional loose stools.Denies black tarry stools or change in stool color. Additionally, he reports light headedness in the morning during breakfast. Pt denies chest pain, chest tightness, syncope, or shortness of breath.   Past Medical History:  Diagnosis Date  . Abnormal LFTs    mild elevation of total, indirect and direct bilirubin  . Allergy   . Congenital absence of kidney    Right  . GERD (gastroesophageal reflux disease)   . Hearing loss   . Sleep apnea    Past Surgical History:  Procedure Laterality Date  . COLONOSCOPY  2007   Rourk: normal  . COLONOSCOPY N/A 12/26/2015   Procedure: COLONOSCOPY;  Surgeon: Daneil Dolin, MD;  Location: [facility];  Service: Endoscopy;  Laterality: N/A;  2:00 PM  . ESOPHAGOGASTRODUODENOSCOPY  01/2002   Dr. Deatra Ina: Normal  . NASAL SEPTUM SURGERY  1980's  . otosclerosis     bilateral ear surgery  . SHOULDER ARTHROSCOPY W/ ROTATOR CUFF REPAIR Left 2003  . titanium implant Left March 2010   left middle ear   Family History  Problem Relation Age of Onset  . Heart disease Mother   . Hypertension Mother   . Diabetes Mother   . Hyperlipidemia Mother   . Heart disease Father   . Lung cancer Father   . Hyperlipidemia Father   . Allergies Son   . Cancer Maternal Aunt        breast  . Cancer Maternal Uncle        colon  . Cancer Paternal Aunt        breast  . Cancer Paternal Uncle        brain tumor  . Neuropathy Neg Hx    Social History   Tobacco Use  . Smoking status: Former Smoker    Packs/day: 1.50    Years: 15.00    Pack  years: 22.50    Types: Cigarettes    Last attempt to quit: 01/13/1988    Years since quitting: 29.2  . Smokeless tobacco: Former Network engineer Use Topics  . Alcohol use: Yes    Alcohol/week: 0.0 oz    Comment: rarely drink beer  . Drug use: No   Caffeine intake: 2 large thermals of coffee each day.    reports that he currently engages in sexual activity and has had partners who are Male.   Current Outpatient Medications:  .  albuterol (PROVENTIL HFA;VENTOLIN HFA) 108 (90 Base) MCG/ACT inhaler, Inhale 2 puffs into the lungs every 6 (six) hours as needed for wheezing., Disp: 1 Inhaler, Rfl: 2 .  atorvastatin (LIPITOR) 10 MG tablet, TAKE 1 TABLET BY MOUTH EVERY DAY, Disp: 30 tablet, Rfl: 5 .  CALCIUM-VITAMIN D PO, Take 1 tablet by mouth daily. 200 units calcium, 1100units vit D, Disp: , Rfl:  .  Cholecalciferol (VITAMIN D) 2000 units CAPS, Take 2,000 Units by mouth daily., Disp: , Rfl:  .  dexlansoprazole (DEXILANT) 60 MG capsule, Take 1 capsule (60 mg total) by mouth daily., Disp: 30 capsule, Rfl: 2 .  diclofenac sodium (VOLTAREN) 1 %  GEL, Apply 4 g topically 4 (four) times daily., Disp: 100 g, Rfl: 3 .  fluticasone (FLONASE) 50 MCG/ACT nasal spray, Place 2 sprays into both nostrils daily., Disp: 16 g, Rfl: 5 .  gabapentin (NEURONTIN) 100 MG capsule, One capsule in the morning, one capsule at lunch, and 2 capsules at bedtime., Disp: 120 capsule, Rfl: 11 .  Gluc-Chonn-MSM-Boswellia-Vit D (GLUCOSAMINE CHONDROITIN COMPLX) TABS, Take 1 tablet by mouth daily. , Disp: , Rfl:  .  loratadine (CLARITIN) 10 MG tablet, Take 10 mg by mouth daily., Disp: , Rfl:  .  Magnesium Oxide 200 MG TABS, Take 200 mg by mouth daily., Disp: , Rfl:  .  Multiple Vitamin (MULTIVITAMIN) capsule, Take 1 capsule by mouth daily.  , Disp: , Rfl:  .  NONFORMULARY OR COMPOUNDED ITEM, [facility]  Peripheral Neuropathy Cream- Bupivacaine 1%, Doxepin 3%, Gabapentin 6%, Pentoxifylline 3%, Topiramate 1% Apply 1-2 grams  to affected area 3-4 times daily Qty. 120 gm 3 refills, Disp: , Rfl:  .  Omega-3 Fatty Acids (FISH OIL PO), Take 1,000 mg by mouth daily., Disp: , Rfl:  .  Probiotic Product (PROBIOTIC PO), Take 1 Dose by mouth daily. 300mg , Disp: , Rfl:  .  sucralfate (CARAFATE) 1 g tablet, Take 1 tablet (1 g total) by mouth 4 (four) times daily -  with meals and at bedtime. Prn reflux, Disp: 40 tablet, Rfl: 0 .  terbinafine (LAMISIL) 250 MG tablet, Take 1 tablet (250 mg total) daily by mouth., Disp: 30 tablet, Rfl: 0 .  vitamin C (ASCORBIC ACID) 500 MG tablet, Take 500 mg by mouth daily. , Disp: , Rfl:      Review of Systems General: Pt reports adequate energy level with regular sleep patterns Cardiac: Denies chest pain, pressure, or palpitations. Pt denies syncope.  Pulmonary: denies shortness of breath, dyspnea, or cough.  GI: see HPI    Objective:   Physical Exam  Orthostatic B/p obtained Left arm -Lying supine: 110/68 -Sitting: 112/70 -Standing: 120/80  General: WDWN. Appears calm and well-groomed Lungs: Chest expansion symmetrical without use of accessory muscles. No adventitious breath sounds to auscultation.  Cardiovascular: RRR. S1, S2 intact. No murmurs, gallops, or extra sounds.  Abdomen: BS present x 4 quadrants. Flat, soft abdomen with mild tenderness over the epigastric area. No masses, tenderness, or organomegaly to palpation.   Hemoglobin POCT: 15.6 Hgb A1c POCT: 5.7     Assessment & Plan:   Problem List Items Addressed This Visit      Digestive   GERD (gastroesophageal reflux disease) - Primary   Relevant Medications   dexlansoprazole (DEXILANT) 60 MG capsule   sucralfate (CARAFATE) 1 g tablet   Other Relevant Orders   POCT hemoglobin (Completed)     Other   Prediabetes   Relevant Orders   POCT glycosylated hemoglobin (Hb A1C) (Completed)      Plan:  -Medication: begin taking sucralfate and Dexilant as prescribed.  -Patient teaching: reduce caffeine intake.  Warning signs reviewed   Return in about 3 weeks (around 04/23/2017) for recheck GERD. Call back sooner if needed.

## 2017-04-02 NOTE — Patient Instructions (Signed)
Stop Nexium; take Dexilant with Carafate for breakthrough symptoms

## 2017-04-05 ENCOUNTER — Ambulatory Visit: Payer: BLUE CROSS/BLUE SHIELD | Admitting: Nurse Practitioner

## 2017-04-08 ENCOUNTER — Other Ambulatory Visit: Payer: Self-pay | Admitting: Family Medicine

## 2017-04-23 ENCOUNTER — Encounter: Payer: Self-pay | Admitting: Nurse Practitioner

## 2017-04-23 ENCOUNTER — Telehealth: Payer: Self-pay | Admitting: *Deleted

## 2017-04-23 ENCOUNTER — Ambulatory Visit: Payer: BLUE CROSS/BLUE SHIELD | Admitting: Nurse Practitioner

## 2017-04-23 VITALS — BP 128/80 | Ht 71.0 in | Wt 223.0 lb

## 2017-04-23 DIAGNOSIS — K219 Gastro-esophageal reflux disease without esophagitis: Secondary | ICD-10-CM

## 2017-04-23 MED ORDER — GABAPENTIN 100 MG PO CAPS: ORAL_CAPSULE | ORAL | 1 refills | Status: DC

## 2017-04-23 NOTE — Progress Notes (Signed)
Subjective:    Patient ID: Phillip Mcguire, male    DOB: 1955/11/25, 62 y.o.   MRN: [id number]  HPI Patient presents today for follow up of GERD. Since beginning the [place], patient reports improvement in belching, tightness, and abdominal pain. Patient continues to experience epigastric burning that is less in severity. Pt reports he has an appt with gastroenterology on 05/03/17.    Additionally, patient reports nasal congestion and headaches that began a few weeks ago. He reports clear nasal drainage but denies fevers, chills, sore throat, or cough.   Past Medical History:  Diagnosis Date  . Abnormal LFTs    mild elevation of total, indirect and direct bilirubin  . Allergy   . Congenital absence of kidney    Right  . GERD (gastroesophageal reflux disease)   . Hearing loss   . Sleep apnea    Past Surgical History:  Procedure Laterality Date  . COLONOSCOPY  2007   Rourk: normal  . COLONOSCOPY N/A 12/26/2015   Procedure: COLONOSCOPY;  Surgeon: Daneil Dolin, MD;  Location: [facility];  Service: Endoscopy;  Laterality: N/A;  2:00 PM  . ESOPHAGOGASTRODUODENOSCOPY  01/2002   Dr. Deatra Ina: Normal  . NASAL SEPTUM SURGERY  1980's  . otosclerosis     bilateral ear surgery  . SHOULDER ARTHROSCOPY W/ ROTATOR CUFF REPAIR Left 2003  . titanium implant Left March 2010   left middle ear   Family History  Problem Relation Age of Onset  . Heart disease Mother   . Hypertension Mother   . Diabetes Mother   . Hyperlipidemia Mother   . Heart disease Father   . Lung cancer Father   . Hyperlipidemia Father   . Allergies Son   . Cancer Maternal Aunt        breast  . Cancer Maternal Uncle        colon  . Cancer Paternal Aunt        breast  . Cancer Paternal Uncle        brain tumor  . Neuropathy Neg Hx    Social History   Tobacco Use  . Smoking status: Former Smoker    Packs/day: 1.50    Years: 15.00    Pack years: 22.50    Types: Cigarettes    Last attempt to quit: 01/13/1988     Years since quitting: 29.2  . Smokeless tobacco: Former Network engineer Use Topics  . Alcohol use: Yes    Alcohol/week: 0.0 oz    Comment: rarely drink beer  . Drug use: No   Patient reports decreasing his daily caffeine intake to 1 large thermal cup daily.   Allergies  Allergen Reactions  . Latex      Review of Systems General: pt reports adequate energy level with a regular sleeping pattern.  HEENT: Pt reports distance vision is more blurry than normal. Denies any vision loss or pain.  Cardiac: denies chest pain, pressure, or palpitations. Pt denies syncope or dizziness.  Pulmonary: denies shortness of breath, dyspnea, or cough.  GI: denies N/V/D. Pr reports regular daily bowel movements.   Neuro: no numbness or weakness of the face, arms or legs. No difficulty speaking or swallowing.      Objective:   Physical Exam   Vitals:   04/23/17 1511  BP: 128/80   General: WDWN. Appears calm and well-groomed. Dressed appropriate for weather.  HEENT: Ears- no lesions, erythema, or exudate. TM- retraction without erythema or exudate. Oropharnyx- mild cloudy  post nasal drip. No erythema or exudate.  Lymphatics: mild cervical adenopathy.  Pulmonary: chest expansion symmetrical. BIL lungs without adventitious breath sounds on auscultation.  Cardiac: RRR, S1 &S2 intact. No murmurs or gallops.  Abdomen: soft with mild tenderness over epigastric area. No masses or organomegaly noted on palpation.      Assessment & Plan:   Problem List Items Addressed This Visit      Digestive   GERD (gastroesophageal reflux disease) - Primary     Plan -Medications: Continue taking Delixant as prescribed.  -Patient teaching: Follow up with gastroenterologist as planned. Continue reducing caffeine intake. Healthy diet and reducing stress discussed.  -Follow up in 6 months unless symptoms worsen. Warning signs and symptoms reviewed. Follow up with optometrist for vision changes. Contact office  if needed after this.

## 2017-04-23 NOTE — Telephone Encounter (Signed)
Refill request for Gabapentin 100mg . Dr. Marta Antu refill +1, needs an appt prior to future refills.

## 2017-04-24 ENCOUNTER — Encounter: Payer: Self-pay | Admitting: Nurse Practitioner

## 2017-04-24 ENCOUNTER — Other Ambulatory Visit: Payer: Self-pay | Admitting: Nurse Practitioner

## 2017-06-01 ENCOUNTER — Other Ambulatory Visit: Payer: Self-pay | Admitting: Family Medicine

## 2017-07-02 ENCOUNTER — Other Ambulatory Visit: Payer: Self-pay | Admitting: Family Medicine

## 2017-08-10 ENCOUNTER — Encounter: Payer: Self-pay | Admitting: Family Medicine

## 2017-08-10 ENCOUNTER — Ambulatory Visit: Payer: BLUE CROSS/BLUE SHIELD | Admitting: Family Medicine

## 2017-08-10 VITALS — BP 122/82 | Ht 71.0 in | Wt 219.2 lb

## 2017-08-10 DIAGNOSIS — Z1159 Encounter for screening for other viral diseases: Secondary | ICD-10-CM

## 2017-08-10 DIAGNOSIS — Z114 Encounter for screening for human immunodeficiency virus [HIV]: Secondary | ICD-10-CM | POA: Diagnosis not present

## 2017-08-10 DIAGNOSIS — J00 Acute nasopharyngitis [common cold]: Secondary | ICD-10-CM

## 2017-08-10 DIAGNOSIS — H9193 Unspecified hearing loss, bilateral: Secondary | ICD-10-CM

## 2017-08-10 DIAGNOSIS — Z0000 Encounter for general adult medical examination without abnormal findings: Secondary | ICD-10-CM

## 2017-08-10 DIAGNOSIS — Z125 Encounter for screening for malignant neoplasm of prostate: Secondary | ICD-10-CM

## 2017-08-10 DIAGNOSIS — B351 Tinea unguium: Secondary | ICD-10-CM

## 2017-08-10 DIAGNOSIS — Z79899 Other long term (current) drug therapy: Secondary | ICD-10-CM

## 2017-08-10 DIAGNOSIS — G4733 Obstructive sleep apnea (adult) (pediatric): Secondary | ICD-10-CM

## 2017-08-10 DIAGNOSIS — E7849 Other hyperlipidemia: Secondary | ICD-10-CM | POA: Diagnosis not present

## 2017-08-10 NOTE — Progress Notes (Signed)
Subjective:    Patient ID: Phillip Mcguire, male    DOB: 1955-09-26, 62 y.o.   MRN: [id number]  HPI The patient comes in today for a wellness visit. Patient in for wellness Has a few other issues going on Has significant troubles with his sleep apnea Uses his machine every night but often has to take it off because of head congestion drainage and coughing He relates a lot of nail stuff stuffiness.  Patient also relates his nails are thickened mainly on his hand but also on his feet he wonders if it could be fungus he wonders what else could be done on his thumb he is actually getting curling in the thumbnail it does not cause pain or discomfort but it is cutting into his skin to some degree \ Patient does take his cholesterol medicine on a regular basis denies any other particular troubles watch his diet he tolerates it well has had this for years A review of their health history was completed.  A review of medications was also completed.  Any needed refills; yes  Eating habits: trying to eat healthy  Falls/  MVA accidents in past few months: none  Regular exercise: walking  Specialist pt sees on regular basis: no  Preventative health issues were discussed.   Additional concerns: none    Review of Systems  Constitutional: Negative for diaphoresis and fatigue.  HENT: Negative for congestion and rhinorrhea.   Respiratory: Negative for cough and shortness of breath.   Cardiovascular: Negative for chest pain and leg swelling.  Gastrointestinal: Negative for abdominal pain and diarrhea.  Skin: Negative for color change and rash.  Neurological: Negative for dizziness and headaches.  Psychiatric/Behavioral: Negative for behavioral problems and confusion.       Objective:   Physical Exam  Constitutional: He appears well-developed and well-nourished.  HENT:  Head: Normocephalic and atraumatic.  Right Ear: External ear normal.  Left Ear: External ear normal.  Nose: Nose  normal.  Mouth/Throat: Oropharynx is clear and moist.  Eyes: Pupils are equal, round, and reactive to light. EOM are normal.  Neck: Normal range of motion. Neck supple. No thyromegaly present.  Cardiovascular: Normal rate, regular rhythm and normal heart sounds.  No murmur heard. Pulmonary/Chest: Effort normal and breath sounds normal. No respiratory distress. He has no wheezes.  Abdominal: Soft. Bowel sounds are normal. He exhibits no distension and no mass. There is no tenderness.  Genitourinary: Penis normal.  Musculoskeletal: Normal range of motion. He exhibits no edema.  Lymphadenopathy:    He has no cervical adenopathy.  Neurological: He is alert. He exhibits normal muscle tone.  Skin: Skin is warm and dry. No erythema.  Psychiatric: He has a normal mood and affect. His behavior is normal. Judgment normal.          Assessment & Plan:  Adult wellness-complete.wellness physical was conducted today. Importance of diet and exercise were discussed in detail.  In addition to this a discussion regarding safety was also covered. We also reviewed over immunizations and gave recommendations regarding current immunization needed for age.  In addition to this additional areas were also touched on including: Preventative health exams needed:  Colonoscopy up-to-date next one is 2027  Patient was advised yearly wellness exam  Sleep apnea continue to use a CPAP machine on a regular basis  Rhinitis issues interferes recommend Flonase loratadine on a regular basis for follow-up of ongoing troubles  Mild cerumen in both ears with hearing loss referral to ENT for  further evaluation May need hearing aids  The patient was seen today as part of an evaluation regarding hyperlipidemia.  Recent lab work has been reviewed with the patient as well as the goals for good cholesterol care.  In addition to this medications have been discussed the importance of compliance with diet and medications  discussed as well.  Finally the patient is aware that poor control of cholesterol, noncompliance can dramatically increase the risk of complications. The patient will keep regular office visits and the patient does agreed to periodic lab work.  Patient has what appears to be fungus in the nails I recommend nail culture await the results of this will think about Lamisil  Follow-up within 6 months sooner problems

## 2017-08-11 LAB — BASIC METABOLIC PANEL
BUN/Creatinine Ratio: 15 (ref 10–24)
BUN: 15 mg/dL (ref 8–27)
CO2: 25 mmol/L (ref 20–29)
CREATININE: 1 mg/dL (ref 0.76–1.27)
Calcium: 10.1 mg/dL (ref 8.6–10.2)
Chloride: 99 mmol/L (ref 96–106)
GFR calc Af Amer: 93 mL/min/{1.73_m2} (ref >59)
GFR calc non Af Amer: 80 mL/min/{1.73_m2} (ref >59)
GLUCOSE: 92 mg/dL (ref 65–99)
Potassium: 4.9 mmol/L (ref 3.5–5.2)
SODIUM: 141 mmol/L (ref 134–144)

## 2017-08-11 LAB — HEPATITIS C ANTIBODY: Hep C Virus Ab: <0.1 s/co ratio (ref 0.0–0.9)

## 2017-08-11 LAB — LIPID PANEL
CHOL/HDL RATIO: 3.3 ratio (ref 0.0–5.0)
Cholesterol, Total: 161 mg/dL (ref 100–199)
HDL: 49 mg/dL (ref >39)
LDL Calculated: 85 mg/dL (ref 0–99)
Triglycerides: 137 mg/dL (ref 0–149)
VLDL CHOLESTEROL CAL: 27 mg/dL (ref 5–40)

## 2017-08-11 LAB — HEPATIC FUNCTION PANEL
ALT: 24 IU/L (ref 0–44)
AST: 21 IU/L (ref 0–40)
Albumin: 4.6 g/dL (ref 3.6–4.8)
Alkaline Phosphatase: 86 IU/L (ref 39–117)
Bilirubin Total: 1.7 mg/dL — ABNORMAL HIGH (ref 0.0–1.2)
Bilirubin, Direct: 0.34 mg/dL (ref 0.00–0.40)
TOTAL PROTEIN: 7.1 g/dL (ref 6.0–8.5)

## 2017-08-11 LAB — HIV ANTIBODY (ROUTINE TESTING W REFLEX): HIV SCREEN 4TH GENERATION: NONREACTIVE

## 2017-08-11 LAB — PSA: PROSTATE SPECIFIC AG, SERUM: 2.1 ng/mL (ref 0.0–4.0)

## 2017-08-12 ENCOUNTER — Other Ambulatory Visit: Payer: Self-pay

## 2017-08-12 ENCOUNTER — Ambulatory Visit: Payer: BLUE CROSS/BLUE SHIELD | Admitting: Podiatry

## 2017-08-12 ENCOUNTER — Encounter: Payer: Self-pay | Admitting: Podiatry

## 2017-08-12 DIAGNOSIS — G5781 Other specified mononeuropathies of right lower limb: Secondary | ICD-10-CM

## 2017-08-12 DIAGNOSIS — R972 Elevated prostate specific antigen [PSA]: Secondary | ICD-10-CM

## 2017-08-12 DIAGNOSIS — E7849 Other hyperlipidemia: Secondary | ICD-10-CM

## 2017-08-12 DIAGNOSIS — G5762 Lesion of plantar nerve, left lower limb: Secondary | ICD-10-CM | POA: Diagnosis not present

## 2017-08-12 DIAGNOSIS — G5782 Other specified mononeuropathies of left lower limb: Secondary | ICD-10-CM

## 2017-08-12 DIAGNOSIS — G5761 Lesion of plantar nerve, right lower limb: Secondary | ICD-10-CM | POA: Diagnosis not present

## 2017-08-13 ENCOUNTER — Other Ambulatory Visit: Payer: Self-pay | Admitting: Family Medicine

## 2017-08-14 NOTE — Progress Notes (Signed)
He presents today states that his feet started to hurt again sent the hernia right here as he points to the second metatarsophalangeal joints bilaterally.  Objective: Vital signs are stable he is alert and oriented x3 not a lot of pain and in the third interdigital space.  Previously had neuromas before.  He now has pain on palpation to the second interdigital space bilaterally.  Palpable Mulder's click noted bilateral.  Pulses remain strong and palpable.  Assessment: Neuroma second interspace bilateral.  Plan: Injected the area today with dehydrated alcohol after sterile Betadine skin prep total of 2 cc of 4% dehydrated alcohol was injected these areas.  Tolerated procedure well without complications.  Follow-up with him in 1 month.

## 2017-09-02 ENCOUNTER — Ambulatory Visit: Payer: BLUE CROSS/BLUE SHIELD | Admitting: Podiatry

## 2017-09-02 ENCOUNTER — Encounter: Payer: Self-pay | Admitting: Podiatry

## 2017-09-02 DIAGNOSIS — G5762 Lesion of plantar nerve, left lower limb: Secondary | ICD-10-CM

## 2017-09-02 DIAGNOSIS — G5761 Lesion of plantar nerve, right lower limb: Secondary | ICD-10-CM

## 2017-09-02 NOTE — Progress Notes (Signed)
He presents today for follow-up of his neuroma second interdigital space bilateral foot.  He states that he starting to have some burning to the forefoot.  Objective: Vital signs are stable he is alert and oriented x3.  Pulses are palpable.  Neurologic sensorium is intact he does have pain on palpation with palpable Mulder's click to the second interdigital space bilaterally.  Assessment: Neuroma second interdigital space bilateral.  Plan: Injected his second dose of dehydrated alcohol 2 cc of 4% dehydrated alcohol after sterile Betadine skin prep to the second interdigital space bilaterally.  We will follow-up with him in 3 weeks.

## 2017-09-23 ENCOUNTER — Ambulatory Visit (INDEPENDENT_AMBULATORY_CARE_PROVIDER_SITE_OTHER): Payer: BLUE CROSS/BLUE SHIELD | Admitting: Podiatry

## 2017-09-23 ENCOUNTER — Encounter: Payer: Self-pay | Admitting: Podiatry

## 2017-09-23 DIAGNOSIS — G5762 Lesion of plantar nerve, left lower limb: Secondary | ICD-10-CM

## 2017-09-23 DIAGNOSIS — G5761 Lesion of plantar nerve, right lower limb: Secondary | ICD-10-CM | POA: Diagnosis not present

## 2017-09-23 NOTE — Progress Notes (Signed)
He presents today for follow-up of his neuroma to the second interdigital space.  He states that it doing a lot better and seems to be getting there.  Objective: Vital signs are stable he is alert and oriented x3 mild tenderness on palpation to the second interdigital space bilaterally with palpable Mulder's click.  Pulses remain palpable no open lesions or wounds.  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  Plan: Reinjected second interspaces today after sterile Betadine skin prep with 2 cc of 4% dehydrated alcohol with local anesthetic.  He tolerated procedure well without complications follow-up with him in 3 weeks.

## 2017-09-30 ENCOUNTER — Encounter: Payer: Self-pay | Admitting: Family Medicine

## 2017-09-30 ENCOUNTER — Ambulatory Visit: Payer: BLUE CROSS/BLUE SHIELD | Admitting: Family Medicine

## 2017-09-30 VITALS — BP 118/82 | Temp 98.2°F | Ht 71.0 in | Wt 220.1 lb

## 2017-09-30 DIAGNOSIS — J019 Acute sinusitis, unspecified: Secondary | ICD-10-CM | POA: Diagnosis not present

## 2017-09-30 MED ORDER — AZITHROMYCIN 250 MG PO TABS: ORAL_TABLET | ORAL | 0 refills | Status: DC

## 2017-09-30 NOTE — Progress Notes (Signed)
   Subjective:    Patient ID: Phillip Mcguire, male    DOB: [birth year], 62 y.o.   MRN: [id number]  HPI Patient is here today with complaints of congestion, cough,sore throat,runny nose for the last few days. He has been taking otc cold and flu. Viral-like illness past few days Wife going through chemotherapy He does not want her to get sick Relates some upper chest congestion denies any wheezing or difficulty breathing No vomiting or diarrhea  Review of Systems  Constitutional: Negative for activity change, chills and fever.  HENT: Positive for congestion and rhinorrhea. Negative for ear pain.   Eyes: Negative for discharge.  Respiratory: Positive for cough. Negative for wheezing.   Cardiovascular: Negative for chest pain.  Gastrointestinal: Negative for nausea and vomiting.  Musculoskeletal: Negative for arthralgias.       Objective:   Physical Exam  Constitutional: He appears well-developed.  HENT:  Head: Normocephalic.  Mouth/Throat: Oropharynx is clear and moist. No oropharyngeal exudate.  Neck: Normal range of motion.  Cardiovascular: Normal rate, regular rhythm and normal heart sounds.  No murmur heard. Pulmonary/Chest: Effort normal and breath sounds normal. He has no wheezes.  Lymphadenopathy:    He has no cervical adenopathy.  Neurological: He exhibits normal muscle tone.  Skin: Skin is warm and dry.  Nursing note and vitals reviewed.         Assessment & Plan:  Viral syndrome Secondary run of sinus prescribed warning signs discussed Follow-up if progressive troubles or worse

## 2017-10-14 ENCOUNTER — Encounter: Payer: Self-pay | Admitting: Podiatry

## 2017-10-14 ENCOUNTER — Ambulatory Visit: Payer: BLUE CROSS/BLUE SHIELD | Admitting: Podiatry

## 2017-10-14 DIAGNOSIS — G5762 Lesion of plantar nerve, left lower limb: Secondary | ICD-10-CM

## 2017-10-14 DIAGNOSIS — G5761 Lesion of plantar nerve, right lower limb: Secondary | ICD-10-CM | POA: Diagnosis not present

## 2017-10-14 MED ORDER — GABAPENTIN 100 MG PO CAPS: ORAL_CAPSULE | ORAL | 3 refills | Status: DC

## 2017-10-14 NOTE — Progress Notes (Signed)
He presents today for follow-up of his neuroma second interdigital space bilaterally.  He states that it seems to be getting some better.  He states that he has burning on the plantar aspect of the bilateral foot.  Objective: Vital signs are stable he is alert and oriented x3.  Pulses are palpable.  Palpable neuroma and palpable Mulder's click second interdigital space bilaterally.  I reviewed his medications currently he is taking gabapentin 100 mg twice a day and 200 mg at night.  Assessment: Neuroma second interspace bilateral.  Neuropathy bilateral.  Plan: Discussed etiology pathology and surgical therapies after sterile Betadine skin prep injected 2 cc of 4% dehydrated alcohol with local anesthetic to the second interdigital space.  Tolerated procedure well without complications.  I also increased his gabapentin to 100 mg in the morning 200 mg at lunch 300 mg at bedtime.  I will follow-up with him in 1 month reevaluation.

## 2017-10-22 ENCOUNTER — Ambulatory Visit: Payer: BLUE CROSS/BLUE SHIELD | Admitting: Nurse Practitioner

## 2017-11-11 ENCOUNTER — Ambulatory Visit: Payer: BLUE CROSS/BLUE SHIELD | Admitting: Podiatry

## 2017-11-11 ENCOUNTER — Encounter: Payer: Self-pay | Admitting: Podiatry

## 2017-11-11 DIAGNOSIS — G5761 Lesion of plantar nerve, right lower limb: Secondary | ICD-10-CM

## 2017-11-11 DIAGNOSIS — G5762 Lesion of plantar nerve, left lower limb: Secondary | ICD-10-CM

## 2017-11-11 NOTE — Progress Notes (Signed)
He presents today for an follow-up of neuroma to the second interdigital space of the bilateral foot he states that is slowly getting better.  Objective: Vital signs are stable he is alert oriented x3.  Palpable Mulder's click second interdigital space of the bilateral foot.  Moderate tenderness on palpation of these areas.  Otherwise the pulses remain palpable.  Assessment: Neuroma second interdigital space bilateral.  Plan: Discussed etiology pathology and surgical therapy at this point went ahead and performed a dehydrated alcohol injection second interdigital space bilateral after sterile Betadine skin prep injected 2 cc of dehydrated alcohol 4% and local anesthetic.  Follow-up with him in 3 to 4 weeks

## 2017-11-12 LAB — FUNGUS CULTURE, BLOOD

## 2017-11-23 ENCOUNTER — Encounter: Payer: Self-pay | Admitting: Family Medicine

## 2017-12-01 ENCOUNTER — Ambulatory Visit: Payer: BLUE CROSS/BLUE SHIELD | Admitting: Family Medicine

## 2017-12-13 ENCOUNTER — Other Ambulatory Visit: Payer: Self-pay | Admitting: Podiatry

## 2017-12-14 ENCOUNTER — Encounter: Payer: Self-pay | Admitting: Podiatry

## 2017-12-14 ENCOUNTER — Ambulatory Visit: Payer: BLUE CROSS/BLUE SHIELD | Admitting: Podiatry

## 2017-12-14 DIAGNOSIS — G5762 Lesion of plantar nerve, left lower limb: Secondary | ICD-10-CM | POA: Diagnosis not present

## 2017-12-14 DIAGNOSIS — G5761 Lesion of plantar nerve, right lower limb: Secondary | ICD-10-CM

## 2017-12-15 NOTE — Progress Notes (Signed)
He presents today for follow-up of his neuroma to the second interdigital space bilaterally.  Stating that it feels a whole lot better.  Objective: Vital signs are stable he is alert and oriented x3 still has a palpable Mulder's click to the third interdigital space bilateral as well as pain on palpation to the second interdigital space as well.  Pulses remain palpable no open lesions or wounds.  Assessment: Well-healing neuromas second and third interdigital spaces bilaterally.  Plan: After sterile Betadine skin prep we injected 2 cc 4% dehydrated alcohol with local anesthetic to the second interdigital space bilateral.  Tolerated procedure well follow-up with him in about 3 weeks.

## 2017-12-28 ENCOUNTER — Other Ambulatory Visit: Payer: Self-pay | Admitting: Family Medicine

## 2018-01-20 ENCOUNTER — Ambulatory Visit: Payer: BLUE CROSS/BLUE SHIELD | Admitting: Podiatry

## 2018-03-22 DIAGNOSIS — M25562 Pain in left knee: Secondary | ICD-10-CM | POA: Insufficient documentation

## 2018-05-19 DIAGNOSIS — H6123 Impacted cerumen, bilateral: Secondary | ICD-10-CM | POA: Insufficient documentation

## 2018-05-30 DIAGNOSIS — M79644 Pain in right finger(s): Secondary | ICD-10-CM | POA: Insufficient documentation

## 2018-06-08 DIAGNOSIS — L608 Other nail disorders: Secondary | ICD-10-CM | POA: Insufficient documentation

## 2018-06-09 ENCOUNTER — Encounter (HOSPITAL_BASED_OUTPATIENT_CLINIC_OR_DEPARTMENT_OTHER): Payer: Self-pay | Admitting: *Deleted

## 2018-06-09 ENCOUNTER — Other Ambulatory Visit: Payer: Self-pay | Admitting: Orthopedic Surgery

## 2018-06-09 ENCOUNTER — Other Ambulatory Visit: Payer: Self-pay

## 2018-06-10 ENCOUNTER — Other Ambulatory Visit (HOSPITAL_COMMUNITY)
Admission: RE | Admit: 2018-06-10 | Discharge: 2018-06-10 | Disposition: A | Discharge status: Home / Self Care | Payer: BLUE CROSS/BLUE SHIELD | Source: Ambulatory Visit | Attending: Orthopedic Surgery | Admitting: Orthopedic Surgery

## 2018-06-10 DIAGNOSIS — Z1159 Encounter for screening for other viral diseases: Secondary | ICD-10-CM | POA: Diagnosis not present

## 2018-06-11 LAB — NOVEL CORONAVIRUS, NAA (HOSP ORDER, SEND-OUT TO REF LAB; TAT 18-24 HRS): SARS-CoV-2, NAA: NOT DETECTED

## 2018-06-14 ENCOUNTER — Other Ambulatory Visit: Payer: Self-pay

## 2018-06-14 ENCOUNTER — Ambulatory Visit (HOSPITAL_BASED_OUTPATIENT_CLINIC_OR_DEPARTMENT_OTHER): Payer: BLUE CROSS/BLUE SHIELD | Admitting: Anesthesiology

## 2018-06-14 ENCOUNTER — Encounter (HOSPITAL_BASED_OUTPATIENT_CLINIC_OR_DEPARTMENT_OTHER): Payer: Self-pay | Admitting: *Deleted

## 2018-06-14 ENCOUNTER — Encounter (HOSPITAL_BASED_OUTPATIENT_CLINIC_OR_DEPARTMENT_OTHER)
Admission: RE | Disposition: A | Discharge status: Home / Self Care | Payer: Self-pay | Source: Home / Self Care | Attending: Orthopedic Surgery

## 2018-06-14 ENCOUNTER — Ambulatory Visit (HOSPITAL_BASED_OUTPATIENT_CLINIC_OR_DEPARTMENT_OTHER)
Admission: RE | Admit: 2018-06-14 | Discharge: 2018-06-14 | Disposition: A | Discharge status: Home / Self Care | Payer: BLUE CROSS/BLUE SHIELD | Attending: Orthopedic Surgery | Admitting: Orthopedic Surgery

## 2018-06-14 DIAGNOSIS — K219 Gastro-esophageal reflux disease without esophagitis: Secondary | ICD-10-CM | POA: Insufficient documentation

## 2018-06-14 DIAGNOSIS — G4730 Sleep apnea, unspecified: Secondary | ICD-10-CM | POA: Insufficient documentation

## 2018-06-14 DIAGNOSIS — Z87891 Personal history of nicotine dependence: Secondary | ICD-10-CM | POA: Insufficient documentation

## 2018-06-14 DIAGNOSIS — L608 Other nail disorders: Secondary | ICD-10-CM | POA: Insufficient documentation

## 2018-06-14 HISTORY — PX: NAILBED REPAIR: SHX5028

## 2018-06-14 SURGERY — REPAIR, NAIL BED
Anesthesia: Monitor Anesthesia Care | Site: Thumb | Laterality: Right

## 2018-06-14 MED ORDER — FENTANYL CITRATE (PF) 100 MCG/2ML IJ SOLN
50.0000 ug | INTRAMUSCULAR | Status: DC | PRN
  Administered 2018-06-14: 100 ug via INTRAVENOUS

## 2018-06-14 MED ORDER — LACTATED RINGERS IV SOLN
INTRAVENOUS | Status: DC
  Administered 2018-06-14: 09:00:00 via INTRAVENOUS

## 2018-06-14 MED ORDER — CHLORHEXIDINE GLUCONATE 4 % EX LIQD: 60.0000 mL | Freq: Once | CUTANEOUS | Status: DC

## 2018-06-14 MED ORDER — MEPIVACAINE HCL 1.5 % IJ SOLN
INTRAMUSCULAR | Status: DC | PRN
  Administered 2018-06-14: 30 mL via PERINEURAL

## 2018-06-14 MED ORDER — PROPOFOL 500 MG/50ML IV EMUL
INTRAVENOUS | Status: DC | PRN
  Administered 2018-06-14: 75 ug/kg/min via INTRAVENOUS

## 2018-06-14 MED ORDER — MEPERIDINE HCL 25 MG/ML IJ SOLN: 6.2500 mg | INTRAMUSCULAR | Status: DC | PRN

## 2018-06-14 MED ORDER — FENTANYL CITRATE (PF) 100 MCG/2ML IJ SOLN
INTRAMUSCULAR | Status: AC
  Filled 2018-06-14: qty 2

## 2018-06-14 MED ORDER — CEFAZOLIN SODIUM-DEXTROSE 2-4 GM/100ML-% IV SOLN
INTRAVENOUS | Status: AC
  Filled 2018-06-14: qty 100

## 2018-06-14 MED ORDER — FENTANYL CITRATE (PF) 100 MCG/2ML IJ SOLN: 25.0000 ug | INTRAMUSCULAR | Status: DC | PRN

## 2018-06-14 MED ORDER — PROPOFOL 10 MG/ML IV BOLUS
INTRAVENOUS | Status: DC | PRN
  Administered 2018-06-14: 10 mg via INTRAVENOUS
  Administered 2018-06-14: 30 mg via INTRAVENOUS
  Administered 2018-06-14: 10 mg via INTRAVENOUS
  Administered 2018-06-14: 20 mg via INTRAVENOUS

## 2018-06-14 MED ORDER — TRAMADOL HCL 50 MG PO TABS: 50.0000 mg | ORAL_TABLET | Freq: Four times a day (QID) | ORAL | 0 refills | Status: DC | PRN

## 2018-06-14 MED ORDER — MIDAZOLAM HCL 2 MG/2ML IJ SOLN
INTRAMUSCULAR | Status: AC
  Filled 2018-06-14: qty 2

## 2018-06-14 MED ORDER — MIDAZOLAM HCL 2 MG/2ML IJ SOLN
1.0000 mg | INTRAMUSCULAR | Status: DC | PRN
  Administered 2018-06-14: 2 mg via INTRAVENOUS

## 2018-06-14 MED ORDER — SCOPOLAMINE 1 MG/3DAYS TD PT72: 1.0000 | MEDICATED_PATCH | Freq: Once | TRANSDERMAL | Status: DC | PRN

## 2018-06-14 MED ORDER — LACTATED RINGERS IV SOLN: INTRAVENOUS | Status: DC

## 2018-06-14 MED ORDER — METOCLOPRAMIDE HCL 5 MG/ML IJ SOLN: 10.0000 mg | Freq: Once | INTRAMUSCULAR | Status: DC | PRN

## 2018-06-14 MED ORDER — CEFAZOLIN SODIUM-DEXTROSE 2-4 GM/100ML-% IV SOLN
2.0000 g | INTRAVENOUS | Status: AC
  Administered 2018-06-14: 2 g via INTRAVENOUS

## 2018-06-14 MED ORDER — LIDOCAINE HCL (CARDIAC) PF 100 MG/5ML IV SOSY
PREFILLED_SYRINGE | INTRAVENOUS | Status: DC | PRN
  Administered 2018-06-14: 40 mg via INTRAVENOUS

## 2018-06-14 SURGICAL SUPPLY — 47 items
APL PRP STRL LF DISP 70% ISPRP (MISCELLANEOUS) ×1
BLADE MINI RND TIP GREEN BEAV (BLADE) ×2 IMPLANT
BLADE SURG 15 STRL LF DISP TIS (BLADE) ×1 IMPLANT
BLADE SURG 15 STRL SS (BLADE) ×3
BNDG CMPR 9X4 STRL LF SNTH (GAUZE/BANDAGES/DRESSINGS) ×1
BNDG COHESIVE 1X5 TAN STRL LF (GAUZE/BANDAGES/DRESSINGS) IMPLANT
BNDG ESMARK 4X9 LF (GAUZE/BANDAGES/DRESSINGS) ×2 IMPLANT
CHLORAPREP W/TINT 26 (MISCELLANEOUS) ×3 IMPLANT
CORD BIPOLAR FORCEPS 12FT (ELECTRODE) ×3 IMPLANT
COVER BACK TABLE REUSABLE LG (DRAPES) ×3 IMPLANT
COVER MAYO STAND REUSABLE (DRAPES) ×3 IMPLANT
COVER WAND RF STERILE (DRAPES) IMPLANT
CUFF TOURN SGL QUICK 18X4 (TOURNIQUET CUFF) IMPLANT
DRAPE EXTREMITY T 121X128X90 (DISPOSABLE) ×3 IMPLANT
DRAPE SURG 17X23 STRL (DRAPES) ×3 IMPLANT
GAUZE SPONGE 4X4 12PLY STRL (GAUZE/BANDAGES/DRESSINGS) ×3 IMPLANT
GAUZE XEROFORM 1X8 LF (GAUZE/BANDAGES/DRESSINGS) ×3 IMPLANT
GLOVE BIOGEL PI IND STRL 7.0 (GLOVE) IMPLANT
GLOVE BIOGEL PI IND STRL 8.5 (GLOVE) ×1 IMPLANT
GLOVE BIOGEL PI INDICATOR 7.0 (GLOVE) ×4
GLOVE BIOGEL PI INDICATOR 8.5 (GLOVE) ×2
GLOVE SURG ORTHO 8.0 STRL STRW (GLOVE) ×1 IMPLANT
GLOVE SURG SS PI 6.5 STRL IVOR (GLOVE) ×6 IMPLANT
GLOVE SURG SS PI 8.0 STRL IVOR (GLOVE) ×2 IMPLANT
GOWN STRL REUS W/ TWL LRG LVL3 (GOWN DISPOSABLE) ×1 IMPLANT
GOWN STRL REUS W/ TWL XL LVL3 (GOWN DISPOSABLE) IMPLANT
GOWN STRL REUS W/TWL LRG LVL3 (GOWN DISPOSABLE) ×3
GOWN STRL REUS W/TWL XL LVL3 (GOWN DISPOSABLE) ×6 IMPLANT
NDL KEITH (NEEDLE) IMPLANT
NDL PRECISIONGLIDE 27X1.5 (NEEDLE) IMPLANT
NEEDLE KEITH (NEEDLE) ×3 IMPLANT
NEEDLE PRECISIONGLIDE 27X1.5 (NEEDLE) IMPLANT
NS IRRIG 1000ML POUR BTL (IV SOLUTION) ×3 IMPLANT
PACK BASIN DAY SURGERY FS (CUSTOM PROCEDURE TRAY) ×3 IMPLANT
PADDING CAST ABS 4INX4YD NS (CAST SUPPLIES) ×2
PADDING CAST ABS COTTON 4X4 ST (CAST SUPPLIES) ×1 IMPLANT
SLING ARM FOAM STRAP LRG (SOFTGOODS) ×2 IMPLANT
SPLINT FINGER 3.25 BULB 911905 (SOFTGOODS) ×2 IMPLANT
STOCKINETTE 4X48 STRL (DRAPES) ×3 IMPLANT
SUT CHROMIC 6 0 G 1 (SUTURE) IMPLANT
SUT ETHILON 4 0 PS 2 18 (SUTURE) ×7 IMPLANT
SUT STEEL 4 0 (SUTURE) ×2 IMPLANT
SUT VIC AB 4-0 P2 18 (SUTURE) ×2 IMPLANT
SYR BULB 3OZ (MISCELLANEOUS) ×3 IMPLANT
SYR CONTROL 10ML LL (SYRINGE) IMPLANT
TOWEL GREEN STERILE FF (TOWEL DISPOSABLE) ×6 IMPLANT
UNDERPAD 30X30 (UNDERPADS AND DIAPERS) ×3 IMPLANT

## 2018-06-14 NOTE — Transfer of Care (Signed)
Immediate Anesthesia Transfer of Care Note  Patient: Phillip Mcguire  Procedure(s) Performed: RECONSTRUCTION NAILBED WITH PALMARIS GRAFT RIGHT THUMB (Right Thumb)  Patient Location: PACU  Anesthesia Type:MAC combined with regional for post-op pain  Level of Consciousness: awake, alert , oriented, drowsy and patient cooperative  Airway & Oxygen Therapy: Patient Spontanous Breathing and Patient connected to face mask oxygen  Post-op Assessment: Report given to RN and Post -op Vital signs reviewed and stable  Post vital signs: Reviewed and stable  Last Vitals:  Vitals Value Taken Time  BP 98/67 06/14/2018 11:39 AM  Temp    Pulse 64 06/14/2018 11:41 AM  Resp 12 06/14/2018 11:41 AM  SpO2 98 % 06/14/2018 11:41 AM  Vitals shown include unvalidated device data.  Last Pain:  Vitals:   06/14/18 0906  TempSrc: Oral  PainSc: 0-No pain      Patients Stated Pain Goal: 0 (38/32/91 9166)  Complications: No apparent anesthesia complications

## 2018-06-14 NOTE — Anesthesia Procedure Notes (Signed)
Anesthesia Regional Block: Supraclavicular block   Pre-Anesthetic Checklist: ,, timeout performed, Correct Patient, Correct Site, Correct Laterality, Correct Procedure, Correct Position, site marked, Risks and benefits discussed,  Surgical consent,  Pre-op evaluation,  At surgeon's request and post-op pain management  Laterality: Right and Upper  Prep: Maximum Sterile Barrier Precautions used, chloraprep       Needles:  Injection technique: Single-shot  Needle Type: Echogenic Stimulator Needle     Needle Length: 10cm      Additional Needles:   Procedures:,,,, ultrasound used (permanent image in chart),,,,  Narrative:  Start time: 06/14/2018 9:45 AM End time: 06/14/2018 9:53 AM Injection made incrementally with aspirations every 5 mL.  Performed by: Personally  Anesthesiologist: Montez Hageman, MD  Additional Notes: Risks, benefits and alternative to block explained extensively.  Patient tolerated procedure well, without complications.

## 2018-06-14 NOTE — Brief Op Note (Signed)
06/14/2018  11:35 AM  PATIENT:  Phillip Mcguire  63 y.o. male  PRE-OPERATIVE DIAGNOSIS:  PINCHER NAIL RIGHT THUMB  POST-OPERATIVE DIAGNOSIS:  PINCHER NAIL RIGHT THUMB  PROCEDURE:  Procedure(s) with comments: RECONSTRUCTION NAILBED WITH PALMARIS GRAFT RIGHT THUMB (Right) - AXILLARY BLOCK  SURGEON:  Surgeon(s) and Role:    * Daryll Brod, MD - Primary  PHYSICIAN ASSISTANT:   ASSISTANTS: none   ANESTHESIA:   regional and IV sedation  EBL: 74ml  BLOOD ADMINISTERED:none  DRAINS: none   LOCAL MEDICATIONS USED:  NONE  SPECIMEN:  No Specimen  DISPOSITION OF SPECIMEN:  N/A  COUNTS:  YES  TOURNIQUET:   Total Tourniquet Time Documented: Upper Arm (Right) - 57 minutes Total: Upper Arm (Right) - 57 minutes   DICTATION: .Viviann Spare Dictation  PLAN OF CARE: Discharge to home after PACU  PATIENT DISPOSITION:  PACU - hemodynamically stable.

## 2018-06-14 NOTE — Anesthesia Preprocedure Evaluation (Signed)
Anesthesia Evaluation  Patient identified by MRN, date of birth, ID band Patient awake    Reviewed: Allergy & Precautions, NPO status , Patient's Chart, lab work & pertinent test results  Airway Mallampati: II  TM Distance: >3 FB Neck ROM: Full    Dental no notable dental hx.    Pulmonary sleep apnea , former smoker,    Pulmonary exam normal breath sounds clear to auscultation       Cardiovascular negative cardio ROS Normal cardiovascular exam Rhythm:Regular Rate:Normal     Neuro/Psych negative neurological ROS  negative psych ROS   GI/Hepatic negative GI ROS, Neg liver ROS, GERD  Medicated and Controlled,  Endo/Other  negative endocrine ROS  Renal/GU negative Renal ROS  negative genitourinary   Musculoskeletal negative musculoskeletal ROS (+)   Abdominal   Peds negative pediatric ROS (+)  Hematology negative hematology ROS (+)   Anesthesia Other Findings   Reproductive/Obstetrics negative OB ROS                             Anesthesia Physical Anesthesia Plan  ASA: II  Anesthesia Plan: MAC   Post-op Pain Management:  Regional for Post-op pain   Induction: Intravenous  PONV Risk Score and Plan: 1 and Ondansetron and Treatment may vary due to age or medical condition  Airway Management Planned: Simple Face Mask  Additional Equipment:   Intra-op Plan:   Post-operative Plan:   Informed Consent: I have reviewed the patients History and Physical, chart, labs and discussed the procedure including the risks, benefits and alternatives for the proposed anesthesia with the patient or authorized representative who has indicated his/her understanding and acceptance.     Dental advisory given  Plan Discussed with: CRNA  Anesthesia Plan Comments:         Anesthesia Quick Evaluation

## 2018-06-14 NOTE — Discharge Instructions (Addendum)

## 2018-06-14 NOTE — H&P (Signed)
Phillip Mcguire is an 63 y.o. male.   Chief Complaint: pincer nail right thumbHPI: Phillip Mcguire is a 63 year old left-hand-dominant male comes in with a complaint of pain in the nails of his right thumb and left index finger. He states is been present for the past 15 to 20 years gradually getting worse. He complains of the thumb nails being deformed gradually increasing with breakdown the skin at the tip. He has prior history of injuries to both hands left greater than right with saws. Many years ago. He has a history of arthritis no history of diabetes thyroid problems or gout. Family history is positive diabetes negative for the remainder. Is been tested.He is referred by Dr. Liborio Mcguire    Past Medical History:  Diagnosis Date  . Abnormal LFTs    mild elevation of total, indirect and direct bilirubin  . Allergy   . Congenital absence of kidney    Right  . GERD (gastroesophageal reflux disease)   . Hearing loss   . Sleep apnea     Past Surgical History:  Procedure Laterality Date  . COLONOSCOPY  2007   Rourk: normal  . COLONOSCOPY N/A 12/26/2015   Procedure: COLONOSCOPY;  Surgeon: Daneil Dolin, MD;  Location: [facility];  Service: Endoscopy;  Laterality: N/A;  2:00 PM  . ESOPHAGOGASTRODUODENOSCOPY  01/2002   Dr. Deatra Ina: Normal  . NASAL SEPTUM SURGERY  1980's  . otosclerosis     bilateral ear surgery  . SHOULDER ARTHROSCOPY W/ ROTATOR CUFF REPAIR Left 2003  . titanium implant Left March 2010   left middle ear    Family History  Problem Relation Age of Onset  . Heart disease Mother   . Hypertension Mother   . Diabetes Mother   . Hyperlipidemia Mother   . Heart disease Father   . Lung cancer Father   . Hyperlipidemia Father   . Diabetes Father   . Allergies Son   . Cancer Maternal Aunt        breast  . Cancer Maternal Uncle        colon  . Cancer Paternal Aunt        breast  . Cancer Paternal Uncle        brain tumor  . Diabetes Sister   . Neuropathy Neg Hx    Social  History:  reports that he quit smoking about 30 years ago. His smoking use included cigarettes. He has a 22.50 pack-year smoking history. He has quit using smokeless tobacco. He reports current alcohol use. He reports that he does not use drugs.  Allergies:  Allergies  Allergen Reactions  . Latex     Medications Prior to Admission  Medication Sig Dispense Refill  . atorvastatin (LIPITOR) 10 MG tablet TAKE 1 TABLET BY MOUTH EVERY DAY 90 tablet 1  . CALCIUM-VITAMIN D PO Take 1 tablet by mouth daily. 200 units calcium, 1100units vit D    . Cholecalciferol (VITAMIN D) 2000 units CAPS Take 2,000 Units by mouth daily.    Marland Kitchen gabapentin (NEURONTIN) 100 MG capsule TAKE 1 CAPSULE BY MOUTH EVERY MORNING AND 2 CAPSULES EVERY EVENING AND 3 CAPSULES AT BEDITME (Patient taking differently: TAKE 1 CAPSULE BY MOUTH EVERY MORNING AND 2 CAPSULES EVERY EVENING) 540 capsule 0  . loratadine (CLARITIN) 10 MG tablet Take 10 mg by mouth daily.    . Magnesium Oxide 200 MG TABS Take 200 mg by mouth daily.    . Multiple Vitamin (MULTIVITAMIN) capsule Take 1 capsule by mouth  daily.      . omeprazole (PRILOSEC) 40 MG capsule Take 40 mg by mouth 2 (two) times daily.    . Probiotic Product (PROBIOTIC PO) Take 1 Dose by mouth daily. 300mg     . vitamin C (ASCORBIC ACID) 500 MG tablet Take 500 mg by mouth daily.     Marland Kitchen albuterol (PROVENTIL HFA;VENTOLIN HFA) 108 (90 Base) MCG/ACT inhaler Inhale 2 puffs into the lungs every 6 (six) hours as needed for wheezing. 1 Inhaler 2  . fluticasone (FLONASE) 50 MCG/ACT nasal spray SPRAY 2 SPRAYS INTO EACH NOSTRIL EVERY DAY 16 g 2  . NONFORMULARY OR COMPOUNDED ITEM [facility]  Peripheral Neuropathy Cream- Bupivacaine 1%, Doxepin 3%, Gabapentin 6%, Pentoxifylline 3%, Topiramate 1% Apply 1-2 grams to affected area 3-4 times daily Qty. 120 gm 3 refills      No results found for this or any previous visit (from the past 48 hour(s)).  No results found.   Pertinent items are noted  in HPI.  Blood pressure 140/76, pulse 64, temperature 98.1 F (36.7 C), temperature source Oral, height 6' (1.829 m), weight 97 kg, SpO2 100 %.  General appearance: alert, cooperative and appears stated age Head: Normocephalic, without obvious abnormality Neck: no JVD Resp: clear to auscultation bilaterally Cardio: regular rate and rhythm, S1, S2 normal, no murmur, click, rub or gallop GI: soft, non-tender; bowel sounds normal; no masses,  no organomegaly Extremities: trumpet nail right thumb Pulses: 2+ and symmetric Skin: Skin color, texture, turgor normal. No rashes or lesions Neurologic: Grossly normal Incision/Wound: na  Assessment/Plan Assessment:  1. Pincer nail deformity    Plan: Would like to try a reconstruction of the nail bed matrix with graft he does have a palmaris longus tendon. Pre-peri-and postoperative course been discussed along with risks and complications. He is aware there is no guarantee to the surgery possibility of infection recurrence injury to arteries nerves tendons complete relief symptoms dystrophy. He is scheduled for reconstruction nailbed with palmaris grafts right thumb as an outpatient under regional anesthesia    Phillip Mcguire 06/14/2018, 9:11 AM

## 2018-06-14 NOTE — Anesthesia Postprocedure Evaluation (Signed)
Anesthesia Post Note  Patient: Phillip Mcguire  Procedure(s) Performed: RECONSTRUCTION NAILBED WITH PALMARIS GRAFT RIGHT THUMB (Right Thumb)     Patient location during evaluation: PACU Anesthesia Type: MAC Level of consciousness: awake and alert Pain management: pain level controlled Vital Signs Assessment: post-procedure vital signs reviewed and stable Respiratory status: spontaneous breathing, nonlabored ventilation, respiratory function stable and patient connected to nasal cannula oxygen Cardiovascular status: stable and blood pressure returned to baseline Postop Assessment: no apparent nausea or vomiting Anesthetic complications: no    Last Vitals:  Vitals:   06/14/18 1140 06/14/18 1141  BP:    Pulse: 61 64  Resp: 12 12  Temp:    SpO2: 98% 98%    Last Pain:  Vitals:   06/14/18 0906  TempSrc: Oral  PainSc: 0-No pain                 Montez Hageman

## 2018-06-14 NOTE — Progress Notes (Signed)
Assisted Dr. Marcell Barlow with right, ultrasound guided, supraclavicular block. Side rails up, monitors on throughout procedure. See vital signs in flow sheet. Tolerated Procedure well.

## 2018-06-14 NOTE — Op Note (Signed)
NAME: [place] RECORD NO: [id number] DATE OF BIRTH: 1955-01-21 FACILITY: [facility] LOCATION: [facility] PHYSICIAN: Wynonia Sours, MD   OPERATIVE REPORT   DATE OF PROCEDURE: 06/14/18    PREOPERATIVE DIAGNOSIS:   Pincer nail right thumb   POSTOPERATIVE DIAGNOSIS:   Same   PROCEDURE:   Zook reconstruction with ulnaris longus ligament grafts right thumb   SURGEON: Daryll Brod, M.D.   ASSISTANT: none   ANESTHESIA:  Regional with sedation   INTRAVENOUS FLUIDS:  Per anesthesia flow sheet.   ESTIMATED BLOOD LOSS:  Minimal.   COMPLICATIONS:  None.   SPECIMENS:  none   TOURNIQUET TIME:    Total Tourniquet Time Documented: Upper Arm (Right) - 57 minutes Total: Upper Arm (Right) - 57 minutes    DISPOSITION:  Stable to PACU.   INDICATIONS: Patient is a 63 year old male with a severe pincer nail to his right thumb.  The nail is almost entirely touching radial to ulnar side distally.  He is desirous having this reconstructed.  A ablation and skin graft was also discussed with the patient.  He would like to maintain the nail plate.  He is admitted for reconstruction with palmaris longus ligament reconstructions for the nailbed right thumb.  Pre-peri-and postoperative course been discussed along with risks and complications.  He is aware there is no guarantee to the surgery the possibility of infection recurrence injury to arteries nerves tendons complete relief symptoms dystrophy the possibility of recurrence of the deformity or deformity of the nail plate on regrowth.  The preoperative area the patient seen extremity marked by both patient and surgeon supraclavicular block was carried out.  OPERATIVE COURSE: Patient is brought to the operating room placed in the supine position with the right arm free.  Prepped done with ChloraPrep.  A three-minute dry time was allowed and timeout taken to confirm patient procedure.  The limb was exsanguinated with an Esmarch  bandage turn placed on the arm was inflated to 250 mmHg.  The nail plate was removed with moderate amount of difficulty.  A incision was made distally [place] in nature to allow elevation of the nail matrix from the bone which it was densely adhered to.  2 tunnels were then fabricated on either side build beneath the nail matrix distally proximally and brought out through the skin just distal to the inner phalangeal joint.  Allow the nail matrix to be elevated.  A separate incision was then made longitudinally over the palmaris longus tendon.  This was isolated bleeders were electrocauterized with bipolar.  Approximately one half of the tendon was then transected proximally and a 3 to 4 cm graft was taken this was split distally.  Periosteal elevators were then used to elevate and bring the tunnel out through the skin over the dorsal proximal aspect of the distal phalanx.  Incisions were made allowing this to be open.  The Lanny Hurst needle was then bent and passed through this a stainless steel wire a monofilament wire was passed over this and needle removed.  AV graft was then sutured and the sutures passed with the monofilament wire from a distal to proximal direction under the nail matrix.  This was sutured in place proximally the incision was closed with 4-0 nylon sutures and the graft sutured to the skin with figure-of-eight 4-0 Vicryl sutures.  The second graft was then placed on the opposite side of the finger underneath the nail matrix done in a similar manner using the [facility] needle  which was removed from the wire passer.  The graft was attached to the wire passer brought proximally and sutured into position over the skin.  The wounds were copiously irrigated with saline.  Graft was attached to the skin with the Vicryl sutures.  The grafts were then tensioned distally brought to the margins of the transverse incision in the skin and sutured in place with the 4-0 Vicryl sutures.  The skin was closed with  interrupted 4-0 nylon's peripherally and four 4-0 Vicryl through the nail matrix and the skin centrally.  Flatten the entire nail to its normal contour.  Was were copiously irrigated with saline.  The donor site on the wrist was closed with interrupted 4-0 nylon sutures.  A sterile compressive dressing was applied to the wrist sterile compressive dressing and splint to the thumb was applied.  On deflation of the tourniquet remaining fingers pink.  He was taken to the recovery room for observation in satisfactory condition.  Graft lengths were approximately two-point 3-1/2 cm.  Patient tolerated the procedure well he will be discharged home to return the hand [facility] in 1week Tylenol ibuprofen with Ultram as a breakthrough medicine.   Daryll Brod, MD Electronically signed, 06/14/18

## 2018-06-15 ENCOUNTER — Encounter (HOSPITAL_BASED_OUTPATIENT_CLINIC_OR_DEPARTMENT_OTHER): Payer: Self-pay | Admitting: Orthopedic Surgery

## 2018-07-12 ENCOUNTER — Ambulatory Visit (INDEPENDENT_AMBULATORY_CARE_PROVIDER_SITE_OTHER): Payer: BC Managed Care – PPO | Admitting: Family Medicine

## 2018-07-12 ENCOUNTER — Telehealth: Payer: Self-pay | Admitting: Family Medicine

## 2018-07-12 ENCOUNTER — Other Ambulatory Visit: Payer: Self-pay

## 2018-07-12 DIAGNOSIS — E7849 Other hyperlipidemia: Secondary | ICD-10-CM

## 2018-07-12 DIAGNOSIS — Z79899 Other long term (current) drug therapy: Secondary | ICD-10-CM

## 2018-07-12 DIAGNOSIS — Z125 Encounter for screening for malignant neoplasm of prostate: Secondary | ICD-10-CM | POA: Diagnosis not present

## 2018-07-12 DIAGNOSIS — R41840 Attention and concentration deficit: Secondary | ICD-10-CM

## 2018-07-12 NOTE — Telephone Encounter (Signed)
Tried to contact patient; pt does not have voicemail set up

## 2018-07-12 NOTE — Telephone Encounter (Signed)
Pt's has CPE scheduled for 8/14 he is out of work now due to surgery and would like to know if it would be ok to go ahead and get lab work done now or if he needs to wait closer to his physical.

## 2018-07-12 NOTE — Progress Notes (Signed)
Subjective:    Patient ID: Phillip Mcguire, male    DOB: 01-24-1955, 63 y.o.   MRN: [id number]  HPI  Patient would like to discuss starting medication for ADD. Patient states he has never been dx or on medication before but is having problems concentrating and following thru on thing and would like to start medication to help with this.  I had a good discussion with the patient he has a family history of this his son had ADD the patient states that he is having such a hard time organizing and following through that is causing difficulties at home with his wife.  The patient is interested in starting on a medication for ADD.  The patient does relate that he had symptoms of this back when he was in elementary and middle school but he states that back then they never did anything for him he is managed to deal with all of these years but recently it has become more difficult Virtual Visit via Video Note  I connected with [name] on 07/12/18 at  9:30 AM EDT by a video enabled telemedicine application and verified that I am speaking with the correct person using two identifiers.  Location: Patient: home Provider: office   I discussed the limitations of evaluation and management by telemedicine and the availability of in person appointments. The patient expressed understanding and agreed to proceed.  History of Present Illness:    Observations/Objective:   Assessment and Plan:   Follow Up Instructions:    I discussed the assessment and treatment plan with the patient. The patient was provided an opportunity to ask questions and all were answered. The patient agreed with the plan and demonstrated an understanding of the instructions.   The patient was advised to call back or seek an in-person evaluation if the symptoms worsen or if the condition fails to improve as anticipated.  I provided 15 minutes of non-face-to-face time during this encounter.      Review of Systems   Constitutional: Negative for activity change, appetite change and fatigue.  HENT: Negative for congestion and rhinorrhea.   Respiratory: Negative for cough and shortness of breath.   Cardiovascular: Negative for chest pain and leg swelling.  Gastrointestinal: Negative for abdominal pain, nausea and vomiting.  Neurological: Negative for dizziness and headaches.  Psychiatric/Behavioral: Negative for agitation and behavioral problems.       Objective:   Physical Exam  Today's visit was via telephone Physical exam was not possible for this visit       Assessment & Plan:  I find no evidence that he should be on medicine currently certainly I believe he has some element of ADD but I would be cautious regarding starting medicine at his age the patient states he is interested in getting further evaluation from a specialist regarding this that would be perfectly fine and we will leave that up to them regarding whether or not to start medicine  I certainly support his right to go ahead and be seen by a specialist for further evaluation we will help set this up  He is due for lab work regarding his other health issues in it is recommended to do a wellness follow-up later this year  15 minutes was spent with patient today discussing healthcare issues which they came.  More than 50% of this visit-total duration of visit-was spent in counseling and coordination of care.  Please see diagnosis regarding the focus of this coordination and care

## 2018-07-12 NOTE — Progress Notes (Signed)
Referral placed.

## 2018-07-12 NOTE — Telephone Encounter (Signed)
Please advise. Thank you

## 2018-07-12 NOTE — Telephone Encounter (Signed)
As long as his lab work is fasting he can do it anytime this month/July

## 2018-07-13 NOTE — Telephone Encounter (Signed)
Tried to contact patient. Pt does not have voicemail set up; unable to leave message

## 2018-07-14 NOTE — Telephone Encounter (Signed)
Tried to call patient. Unable to leave voicemail due to no voicemail set up

## 2018-07-21 ENCOUNTER — Encounter: Payer: Self-pay | Admitting: Family Medicine

## 2018-07-21 NOTE — Telephone Encounter (Signed)
Telephone call no answer- no voicemail set up

## 2018-07-26 NOTE — Telephone Encounter (Signed)
And mailed bw orders to pt to do in July

## 2018-07-26 NOTE — Telephone Encounter (Signed)
Tried calling pt again and no answer. I printed bw orders and put a note on it that dr Nicki Reaper states he can do anytime in July.

## 2018-07-30 ENCOUNTER — Encounter: Payer: Self-pay | Admitting: Family Medicine

## 2018-07-30 LAB — LIPID PANEL
Chol/HDL Ratio: 2.7 ratio (ref 0.0–5.0)
Cholesterol, Total: 146 mg/dL (ref 100–199)
HDL: 54 mg/dL (ref >39)
LDL Calculated: 78 mg/dL (ref 0–99)
Triglycerides: 68 mg/dL (ref 0–149)
VLDL Cholesterol Cal: 14 mg/dL (ref 5–40)

## 2018-07-30 LAB — HEPATIC FUNCTION PANEL
ALT: 18 IU/L (ref 0–44)
AST: 18 IU/L (ref 0–40)
Albumin: 4.5 g/dL (ref 3.8–4.8)
Alkaline Phosphatase: 87 IU/L (ref 39–117)
Bilirubin Total: 1.3 mg/dL — ABNORMAL HIGH (ref 0.0–1.2)
Bilirubin, Direct: 0.25 mg/dL (ref 0.00–0.40)
Total Protein: 7 g/dL (ref 6.0–8.5)

## 2018-07-30 LAB — BASIC METABOLIC PANEL
BUN/Creatinine Ratio: 17 (ref 10–24)
BUN: 19 mg/dL (ref 8–27)
CO2: 26 mmol/L (ref 20–29)
Calcium: 9.7 mg/dL (ref 8.6–10.2)
Chloride: 99 mmol/L (ref 96–106)
Creatinine, Ser: 1.11 mg/dL (ref 0.76–1.27)
GFR calc Af Amer: 81 mL/min/{1.73_m2} (ref >59)
GFR calc non Af Amer: 70 mL/min/{1.73_m2} (ref >59)
Glucose: 102 mg/dL — ABNORMAL HIGH (ref 65–99)
Potassium: 4.3 mmol/L (ref 3.5–5.2)
Sodium: 139 mmol/L (ref 134–144)

## 2018-07-30 LAB — PSA: Prostate Specific Ag, Serum: 1.6 ng/mL (ref 0.0–4.0)

## 2018-08-24 ENCOUNTER — Telehealth: Payer: Self-pay | Admitting: Family Medicine

## 2018-08-24 MED ORDER — ATORVASTATIN CALCIUM 10 MG PO TABS: 10.0000 mg | ORAL_TABLET | Freq: Every day | ORAL | 1 refills | Status: DC

## 2018-08-24 NOTE — Telephone Encounter (Signed)
error 

## 2018-08-25 ENCOUNTER — Ambulatory Visit (INDEPENDENT_AMBULATORY_CARE_PROVIDER_SITE_OTHER): Payer: BC Managed Care – PPO | Admitting: Psychology

## 2018-08-25 DIAGNOSIS — F4323 Adjustment disorder with mixed anxiety and depressed mood: Secondary | ICD-10-CM | POA: Diagnosis not present

## 2018-08-26 ENCOUNTER — Other Ambulatory Visit: Payer: Self-pay

## 2018-08-26 ENCOUNTER — Ambulatory Visit (INDEPENDENT_AMBULATORY_CARE_PROVIDER_SITE_OTHER): Payer: BC Managed Care – PPO | Admitting: Family Medicine

## 2018-08-26 VITALS — BP 112/76 | Temp 98.0°F | Ht 71.0 in | Wt 213.4 lb

## 2018-08-26 DIAGNOSIS — Z0000 Encounter for general adult medical examination without abnormal findings: Secondary | ICD-10-CM

## 2018-08-26 NOTE — Progress Notes (Signed)
Subjective:    Patient ID: Phillip Mcguire, male    DOB: August 11, 1955, 63 y.o.   MRN: [id number]  HPI  The patient comes in today for a wellness visit. This patient was seen in June and chronic health issues were handled at that point here today for a wellness.  Overall try to eat healthy stay physically active He states the gastroenterologist has a plan to get him off of his PPI   A review of their health history was completed.  A review of medications was also completed.  Any needed refills; yes  Eating habits: pretty good  Falls/  MVA accidents in past few months: none  Regular exercise: not since covid  Specialist pt sees on regular basis: GI, Podiatrist and orthopedic  Preventative health issues were discussed.   Additional concerns: none   Review of Systems  Constitutional: Negative for activity change, appetite change and fever.  HENT: Negative for congestion and rhinorrhea.   Eyes: Negative for discharge.  Respiratory: Negative for cough and wheezing.   Cardiovascular: Negative for chest pain.  Gastrointestinal: Negative for abdominal pain, blood in stool and vomiting.  Genitourinary: Negative for difficulty urinating and frequency.  Musculoskeletal: Negative for neck pain.  Skin: Negative for rash.  Allergic/Immunologic: Negative for environmental allergies and food allergies.  Neurological: Negative for weakness and headaches.  Psychiatric/Behavioral: Negative for agitation.       Objective:   Physical Exam Constitutional:      Appearance: He is well-developed.  HENT:     Head: Normocephalic and atraumatic.     Right Ear: External ear normal.     Left Ear: External ear normal.     Nose: Nose normal.  Eyes:     Pupils: Pupils are equal, round, and reactive to light.  Neck:     Musculoskeletal: Normal range of motion and neck supple.     Thyroid: No thyromegaly.  Cardiovascular:     Rate and Rhythm: Normal rate and regular rhythm.     Heart sounds:  Normal heart sounds. No murmur.  Pulmonary:     Effort: Pulmonary effort is normal. No respiratory distress.     Breath sounds: Normal breath sounds. No wheezing.  Abdominal:     General: Bowel sounds are normal. There is no distension.     Palpations: Abdomen is soft. There is no mass.     Tenderness: There is no abdominal tenderness.  Genitourinary:    Penis: Normal.      Prostate: Normal.  Musculoskeletal: Normal range of motion.  Lymphadenopathy:     Cervical: No cervical adenopathy.  Skin:    General: Skin is warm and dry.     Findings: No erythema.  Neurological:     Mental Status: He is alert.     Motor: No abnormal muscle tone.  Psychiatric:        Behavior: Behavior normal.        Judgment: Judgment normal.     Prostate exam normal Glucose slightly elevated will need to monitor closely healthy diet recommended Lab work overall looks good    Assessment & Plan:  Adult wellness-complete.wellness physical was conducted today. Importance of diet and exercise were discussed in detail.  In addition to this a discussion regarding safety was also covered. We also reviewed over immunizations and gave recommendations regarding current immunization needed for age.  In addition to this additional areas were also touched on including: Preventative health exams needed:  Colonoscopy 2027  Patient was advised  yearly wellness exam

## 2018-09-02 ENCOUNTER — Ambulatory Visit: Payer: BC Managed Care – PPO | Admitting: Psychology

## 2018-09-05 ENCOUNTER — Ambulatory Visit (INDEPENDENT_AMBULATORY_CARE_PROVIDER_SITE_OTHER): Payer: BC Managed Care – PPO | Admitting: Psychology

## 2018-09-05 DIAGNOSIS — F4323 Adjustment disorder with mixed anxiety and depressed mood: Secondary | ICD-10-CM | POA: Diagnosis not present

## 2018-09-27 ENCOUNTER — Ambulatory Visit (INDEPENDENT_AMBULATORY_CARE_PROVIDER_SITE_OTHER): Payer: BC Managed Care – PPO | Admitting: Psychology

## 2018-09-27 DIAGNOSIS — F4323 Adjustment disorder with mixed anxiety and depressed mood: Secondary | ICD-10-CM

## 2018-10-11 ENCOUNTER — Other Ambulatory Visit: Payer: Self-pay

## 2018-10-11 ENCOUNTER — Ambulatory Visit (INDEPENDENT_AMBULATORY_CARE_PROVIDER_SITE_OTHER): Payer: BC Managed Care – PPO | Admitting: Psychology

## 2018-10-11 ENCOUNTER — Encounter: Payer: Self-pay | Admitting: Family Medicine

## 2018-10-11 ENCOUNTER — Ambulatory Visit (INDEPENDENT_AMBULATORY_CARE_PROVIDER_SITE_OTHER): Payer: BC Managed Care – PPO | Admitting: Family Medicine

## 2018-10-11 DIAGNOSIS — J019 Acute sinusitis, unspecified: Secondary | ICD-10-CM

## 2018-10-11 DIAGNOSIS — F4323 Adjustment disorder with mixed anxiety and depressed mood: Secondary | ICD-10-CM

## 2018-10-11 MED ORDER — CEFDINIR 300 MG PO CAPS: 300.0000 mg | ORAL_CAPSULE | Freq: Two times a day (BID) | ORAL | 0 refills | Status: DC

## 2018-10-11 NOTE — Progress Notes (Signed)
   Subjective:    Patient ID: Phillip Mcguire, male    DOB: 03-Jan-1956, 63 y.o.   MRN: [id number]  Sinus Problem This is a chronic problem. The current episode started in the past 7 days. Associated symptoms include chills, congestion, coughing and headaches. Treatments tried: otc cold med.   Pt got into dust at work  Then got cong and stffy  Felt chills   Still has cong  Has cough and drainage  Works [employer]    Review of Systems  Constitutional: Positive for chills.  HENT: Positive for congestion.   Respiratory: Positive for cough.   Neurological: Positive for headaches.   Virtual Visit via Video Note  I connected with Phillip Mcguire on 10/11/18 at  3:50 PM EDT by a video enabled telemedicine application and verified that I am speaking with the correct person using two identifiers.  Location: Patient: home Provider: office   I discussed the limitations of evaluation and management by telemedicine and the availability of in person appointments. The patient expressed understanding and agreed to proceed.  History of Present Illness:    Observations/Objective:   Assessment and Plan:   Follow Up Instructions:    I discussed the assessment and treatment plan with the patient. The patient was provided an opportunity to ask questions and all were answered. The patient agreed with the plan and demonstrated an understanding of the instructions.   The patient was advised to call back or seek an in-person evaluation if the symptoms worsen or if the condition fails to improve as anticipated.  I provided 17 minutes of non-face-to-face time during this encounter.   Works at [employer].  There have been confirmed cases of coronavirus within the facility.  Some very recently.  Has had some chills.  Along with typical congestion and sinus symptoms that he gets a couple times per year.  No fever.  Some congestion in chest.  In fact patient stayed home yesterday went back to  work today.      Objective:   Physical Exam  Virtual      Assessment & Plan:  Impression probable sinusitis though significant discussion held definitely encouraged getting med code with 19 testing rationale discussed patient will proceed antibiotics prescribed warning signs discussed precautionary measures discussed

## 2018-10-12 ENCOUNTER — Other Ambulatory Visit: Payer: Self-pay

## 2018-10-12 ENCOUNTER — Telehealth: Payer: Self-pay | Admitting: Family Medicine

## 2018-10-12 DIAGNOSIS — Z20822 Contact with and (suspected) exposure to covid-19: Secondary | ICD-10-CM

## 2018-10-12 NOTE — Telephone Encounter (Signed)
Front Please do work excuse from 928 through October 8  Suspected COVID infection thank you may send it through my chart thank you

## 2018-10-13 ENCOUNTER — Encounter: Payer: Self-pay | Admitting: Family Medicine

## 2018-10-13 LAB — NOVEL CORONAVIRUS, NAA: SARS-CoV-2, NAA: DETECTED — AB

## 2018-10-18 ENCOUNTER — Encounter: Payer: Self-pay | Admitting: Family Medicine

## 2018-10-18 ENCOUNTER — Other Ambulatory Visit: Payer: Self-pay

## 2018-10-18 ENCOUNTER — Ambulatory Visit (INDEPENDENT_AMBULATORY_CARE_PROVIDER_SITE_OTHER): Payer: BC Managed Care – PPO | Admitting: Family Medicine

## 2018-10-18 DIAGNOSIS — U071 COVID-19: Secondary | ICD-10-CM

## 2018-10-18 MED ORDER — ALBUTEROL SULFATE HFA 108 (90 BASE) MCG/ACT IN AERS: INHALATION_SPRAY | RESPIRATORY_TRACT | 0 refills | Status: DC

## 2018-10-18 MED ORDER — DOXYCYCLINE HYCLATE 100 MG PO TABS: ORAL_TABLET | ORAL | 0 refills | Status: DC

## 2018-10-18 NOTE — Progress Notes (Signed)
   Subjective:  Audio post video  Patient ID: Phillip Mcguire, male    DOB: 03/18/1955, 63 y.o.   MRN: [id number]  HPI Pt states that he is still having cough, sinus issues, headache. Pt states he has been taking antibiotic. Pt is eating and drinking ok. No fever. Pt tested positive for COVID on 10/12/2018.  Virtual Visit via Video Note  I connected with [name] on 10/18/18 at 11:00 AM EDT by a video enabled telemedicine application and verified that I am speaking with the correct person using two identifiers.  Location: Patient: home Provider: office   I discussed the limitations of evaluation and management by telemedicine and the availability of in person appointments. The patient expressed understanding and agreed to proceed.  History of Present Illness:    Observations/Objective:   Assessment and Plan:   Follow Up Instructions:    I discussed the assessment and treatment plan with the patient. The patient was provided an opportunity to ask questions and all were answered. The patient agreed with the plan and demonstrated an understanding of the instructions.   The patient was advised to call back or seek an in-person evaluation if the symptoms worsen or if the condition fails to improve as anticipated.  I provided 17 minutes of non-face-to-face time during this encounter.   [name], LPN  Patient's cough is now settled more into his chest.  Wheezy at times.  Occasionally productive.  Low-grade fever.  Achy.  Worsens with activity.  No severe shortness of breath  Review of Systems No headache no chest pain or high fevers    Objective:   Physical Exam   Virtual     Assessment & Plan:  Impression COVID-19 with increased elements of wheezing and bronchial symptoms.  Warning signs discussed carefully extended work excuse prednisone taper albuterol as needed antibiotics.  Follow-up next Monday

## 2018-10-19 ENCOUNTER — Encounter: Payer: Self-pay | Admitting: Family Medicine

## 2018-10-23 ENCOUNTER — Other Ambulatory Visit: Payer: Self-pay

## 2018-10-24 ENCOUNTER — Telehealth: Payer: Self-pay | Admitting: Family Medicine

## 2018-10-24 ENCOUNTER — Ambulatory Visit (INDEPENDENT_AMBULATORY_CARE_PROVIDER_SITE_OTHER): Payer: BC Managed Care – PPO | Admitting: Family Medicine

## 2018-10-24 ENCOUNTER — Encounter: Payer: Self-pay | Admitting: Family Medicine

## 2018-10-24 DIAGNOSIS — U071 COVID-19: Secondary | ICD-10-CM | POA: Diagnosis not present

## 2018-10-24 NOTE — Telephone Encounter (Signed)
Patient had someone drop off a work form for him to be filled out.  Also said he is suppose to have a letter for work done.  Form in [employer]

## 2018-10-24 NOTE — Progress Notes (Signed)
   Subjective:  Audio line  Patient ID: Phillip Mcguire, male    DOB: July 02, 1955, 63 y.o.   MRN: [id number]  HPI follow up on cough and wheezing. Taking doxy.   Virtual Visit via Telephone Note  I connected with Phillip Mcguire on 10/24/18 at  9:30 AM EDT by telephone and verified that I am speaking with the correct person using two identifiers.  Location: Patient: home Provider: office   I discussed the limitations, risks, security and privacy concerns of performing an evaluation and management service by telephone and the availability of in person appointments. I also discussed with the patient that there may be a patient responsible charge related to this service. The patient expressed understanding and agreed to proceed.   History of Present Illness:    Observations/Objective:   Assessment and Plan:   Follow Up Instructions:    I discussed the assessment and treatment plan with the patient. The patient was provided an opportunity to ask questions and all were answered. The patient agreed with the plan and demonstrated an understanding of the instructions.   The patient was advised to call back or seek an in-person evaluation if the symptoms worsen or if the condition fails to improve as anticipated.  19 minutes of non-face-to-face time during this encounter.  Patient continues to manifest a slight cough.  Known COVID-19 positive.  Positive test was 13 days ago.  No fever for the past week.  No longer having shortness of breath.  Still feeling substantial fatigue.  Feels he is not ready to go back to his regular job with all of its demands    Review of Systems No headache, no major weight loss or weight gain, no chest pain no back pain abdominal pain no change in bowel habits complete ROS otherwise negative      Objective:   Physical Exam   Virtual     Assessment & Plan:  Impression improving COVID-19.  Discussed.  Continue symptom care with albuterol.  Finish  Doxy.  Return to work next Monday.  I will do a letter of excuse and limit his work the following 2 weeks to 8 hours/day in the more than 40 hours/week

## 2018-10-25 ENCOUNTER — Ambulatory Visit (INDEPENDENT_AMBULATORY_CARE_PROVIDER_SITE_OTHER): Payer: BC Managed Care – PPO | Admitting: Psychology

## 2018-10-25 DIAGNOSIS — F4323 Adjustment disorder with mixed anxiety and depressed mood: Secondary | ICD-10-CM | POA: Diagnosis not present

## 2018-10-25 NOTE — Telephone Encounter (Signed)
Sorry form was in dr scott's [employer], [employer]. I put it in your folder along with the letter you already did so pt can pick up at same time.

## 2018-10-26 ENCOUNTER — Telehealth: Payer: Self-pay | Admitting: *Deleted

## 2018-10-26 ENCOUNTER — Telehealth: Payer: Self-pay | Admitting: Family Medicine

## 2018-10-26 MED ORDER — BENZONATATE 100 MG PO CAPS: 100.0000 mg | ORAL_CAPSULE | Freq: Three times a day (TID) | ORAL | 2 refills | Status: DC | PRN

## 2018-10-26 NOTE — Telephone Encounter (Signed)
Pt called to check on his return to work note, pt states Dr. Richardson Landry was going to write this for him  Didn't see anything in chart, Please advise & call pt

## 2018-10-26 NOTE — Telephone Encounter (Signed)
Having cough during the day, not too bad at night. No fever, no sob, wheezing is better. Would like tessalon for cough like his wife got today.  [facility].

## 2018-10-26 NOTE — Telephone Encounter (Signed)
Tessalon 100 mg, #30, 2 refills, 1 taken 3 times daily as needed for cough

## 2018-10-26 NOTE — Telephone Encounter (Signed)
Med sent to pharm. Pt notified.  

## 2018-10-26 NOTE — Telephone Encounter (Signed)
Dr Richardson Landry, I know you did this letter yesterday and i sent it back with a form for pt. Do you still have it in your office

## 2018-10-27 ENCOUNTER — Encounter: Payer: Self-pay | Admitting: Family Medicine

## 2018-10-27 NOTE — Telephone Encounter (Signed)
Form and letter up front. Form was faxed and pt notified it is ready for pickup. States he will have his son pick it up.

## 2018-10-27 NOTE — Telephone Encounter (Signed)
done

## 2018-11-09 ENCOUNTER — Ambulatory Visit: Payer: BC Managed Care – PPO | Admitting: Psychology

## 2019-02-02 ENCOUNTER — Ambulatory Visit: Payer: BC Managed Care – PPO

## 2019-02-02 ENCOUNTER — Other Ambulatory Visit: Payer: Self-pay

## 2019-02-02 ENCOUNTER — Encounter: Payer: Self-pay | Admitting: Podiatry

## 2019-02-02 ENCOUNTER — Ambulatory Visit: Payer: BC Managed Care – PPO | Admitting: Podiatry

## 2019-02-02 DIAGNOSIS — G5761 Lesion of plantar nerve, right lower limb: Secondary | ICD-10-CM

## 2019-02-02 DIAGNOSIS — G5782 Other specified mononeuropathies of left lower limb: Secondary | ICD-10-CM

## 2019-02-02 DIAGNOSIS — M722 Plantar fascial fibromatosis: Secondary | ICD-10-CM | POA: Diagnosis not present

## 2019-02-02 DIAGNOSIS — M7750 Other enthesopathy of unspecified foot: Secondary | ICD-10-CM

## 2019-02-02 DIAGNOSIS — G5762 Lesion of plantar nerve, left lower limb: Secondary | ICD-10-CM | POA: Diagnosis not present

## 2019-02-02 NOTE — Progress Notes (Signed)
He presents today for follow-up of his neuroma second and third bilaterally.  He is also complaining of his bilateral heel pain.  Objective: Vital signs stable alert oriented x3 I reviewed his past medical history medications allergies unchanged he has pain on palpation medial calcaneal tubercles bilaterally he also has pain on palpation to the third interdigital space of the left foot second interspace of the right foot.  Assessment: Plan fasciitis bilaterally as well as neuroma third interdigital space left second interspace right.  Plan: Discussed etiology pathology conservative surgical therapies at this point I injected the bilateral heels 20 mg Kenalog 5 mg Marcaine point of maximal tenderness bilateral heels.  Injected 10 mg to each interdigital space affected.  Tolerated procedure well on follow-up with him in a couple of weeks.

## 2019-02-09 ENCOUNTER — Ambulatory Visit (INDEPENDENT_AMBULATORY_CARE_PROVIDER_SITE_OTHER): Payer: BC Managed Care – PPO | Admitting: Family Medicine

## 2019-02-09 ENCOUNTER — Ambulatory Visit (HOSPITAL_COMMUNITY)
Admission: RE | Admit: 2019-02-09 | Discharge: 2019-02-09 | Disposition: A | Discharge status: Home / Self Care | Payer: BC Managed Care – PPO | Source: Ambulatory Visit | Attending: Family Medicine | Admitting: Family Medicine

## 2019-02-09 ENCOUNTER — Other Ambulatory Visit: Payer: Self-pay

## 2019-02-09 VITALS — BP 124/70 | Temp 97.5°F | Ht 71.0 in | Wt 218.0 lb

## 2019-02-09 DIAGNOSIS — R0789 Other chest pain: Secondary | ICD-10-CM | POA: Insufficient documentation

## 2019-02-09 DIAGNOSIS — F4321 Adjustment disorder with depressed mood: Secondary | ICD-10-CM

## 2019-02-09 MED ORDER — MELOXICAM 15 MG PO TABS: 15.0000 mg | ORAL_TABLET | Freq: Every day | ORAL | 0 refills | Status: DC

## 2019-02-09 NOTE — Progress Notes (Signed)
   Subjective:    Patient ID: Phillip Mcguire, male    DOB: 10/26/1955, 64 y.o.   MRN: [id number]  HPIleft shoulder pain for a few months. Had surgery on shoulder back around 2012. Not tried anything for pain.  This patient relates that he has had shoulder pain off and on over the past few months.  Radiates around the top of the neck and into the shoulder does not radiate down the arm no falls no injuries denies any mishaps.  PMH benign  Fall Risk  08/10/2017 04/02/2016 01/24/2015  Falls in the past year? No No No    Patient also rate relates intermittent chest pain in the left upper chest happens when he gets emotional or stressed.  Does not happen when he is walking.  Sometimes happens when he moves his arm.  He states his overall energy level has been okay appetites been okay he has been struggling with feeling down because he lost his wife recently to Covid Patient had worked very hard working through the motions but it has been very difficult on him.  Currently he does not feel he needs medication and does not want counseling he has some friends and family he talks to  He does walk 30 to 40 minutes on a treadmill on a regular basis and denies any chest tightness pressure pain   Review of Systems  Constitutional: Negative for activity change.  HENT: Negative for congestion and rhinorrhea.   Respiratory: Negative for cough and shortness of breath.   Cardiovascular: Positive for chest pain. Negative for palpitations and leg swelling.  Gastrointestinal: Negative for abdominal pain, diarrhea, nausea and vomiting.  Genitourinary: Negative for dysuria and hematuria.  Musculoskeletal: Positive for back pain.  Neurological: Negative for weakness and headaches.  Psychiatric/Behavioral: Negative for behavioral problems and confusion.       Objective:   Physical Exam Vitals reviewed.  Constitutional:      General: He is not in acute distress. HENT:     Head: Normocephalic and atraumatic.   Eyes:     General:        Right eye: No discharge.        Left eye: No discharge.  Neck:     Trachea: No tracheal deviation.  Cardiovascular:     Rate and Rhythm: Normal rate and regular rhythm.     Heart sounds: Normal heart sounds. No murmur.  Pulmonary:     Effort: Pulmonary effort is normal. No respiratory distress.     Breath sounds: Normal breath sounds.  Lymphadenopathy:     Cervical: No cervical adenopathy.  Skin:    General: Skin is warm and dry.  Neurological:     Mental Status: He is alert.     Coordination: Coordination normal.  Psychiatric:        Behavior: Behavior normal.   Can pour out exam reveals some discomfort in the left shoulder  No obvious weakness in the arm other than some discomfort in the shoulder      Assessment & Plan:  Shoulder discomfort Range of motion exercises Chest x-ray Anti-inflammatory regular basis over the next few weeks If not doing better in the next few weeks consider referral to orthopedics  Upper chest wall discomfort doubt that this is a tumor or growth I doubt that it is coronary artery disease His EKG looks good there is no acute changes on EKG No change compared to previous  Grief-patient working his way through this denies being depressed currently

## 2019-02-16 ENCOUNTER — Other Ambulatory Visit: Payer: Self-pay | Admitting: Family Medicine

## 2019-02-16 ENCOUNTER — Encounter: Payer: Self-pay | Admitting: Family Medicine

## 2019-03-02 ENCOUNTER — Ambulatory Visit: Payer: BC Managed Care – PPO | Admitting: Podiatry

## 2019-04-04 ENCOUNTER — Encounter: Payer: Self-pay | Admitting: Podiatry

## 2019-04-04 ENCOUNTER — Other Ambulatory Visit: Payer: Self-pay

## 2019-04-04 ENCOUNTER — Ambulatory Visit: Payer: BC Managed Care – PPO | Admitting: Podiatry

## 2019-04-04 ENCOUNTER — Ambulatory Visit (INDEPENDENT_AMBULATORY_CARE_PROVIDER_SITE_OTHER): Payer: BC Managed Care – PPO

## 2019-04-04 ENCOUNTER — Other Ambulatory Visit: Payer: Self-pay | Admitting: Podiatry

## 2019-04-04 VITALS — Temp 97.2°F

## 2019-04-04 DIAGNOSIS — G5762 Lesion of plantar nerve, left lower limb: Secondary | ICD-10-CM

## 2019-04-04 DIAGNOSIS — M2042 Other hammer toe(s) (acquired), left foot: Secondary | ICD-10-CM | POA: Diagnosis not present

## 2019-04-04 DIAGNOSIS — G5761 Lesion of plantar nerve, right lower limb: Secondary | ICD-10-CM | POA: Diagnosis not present

## 2019-04-04 DIAGNOSIS — M722 Plantar fascial fibromatosis: Secondary | ICD-10-CM

## 2019-04-04 NOTE — Progress Notes (Signed)
He presents today for bilateral follow-up of plantar fasciitis and the neuromas third interdigital space bilaterally he is also concerned about a red spot on the second digit of the left foot.  Objective: Vital signs are stable he is alert and oriented x3.  There is no erythema edema cellulitis drainage or odor with exception of mild erythema to the second toe overlying the PIPJ left foot.  There is no pain on palpation of the toe is flexible to a certain point where he had does have a little bit of plantar flexion this rigid at near extension.  He also has a palpable neuroma third interdigital space bilaterally not nearly as symptomatic as it has been in the past but his plantar fasciitis bilaterally.  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  Hammertoe.  Plan: Discussed etiology pathology and surgical therapies at this point in time reinjected the bilateral heels 20 mg Kenalog 5 mg Marcaine injected dehydrated alcohol to the third interdigital spaces bilaterally.  And I also set him up for orthotics and he was casted.

## 2019-04-28 ENCOUNTER — Other Ambulatory Visit: Payer: Self-pay | Admitting: Podiatry

## 2019-05-04 ENCOUNTER — Encounter: Payer: Self-pay | Admitting: Podiatry

## 2019-05-04 ENCOUNTER — Other Ambulatory Visit: Payer: BC Managed Care – PPO | Admitting: Orthotics

## 2019-05-04 ENCOUNTER — Other Ambulatory Visit: Payer: Self-pay

## 2019-05-04 ENCOUNTER — Ambulatory Visit: Payer: BC Managed Care – PPO | Admitting: Podiatry

## 2019-05-04 DIAGNOSIS — M722 Plantar fascial fibromatosis: Secondary | ICD-10-CM

## 2019-05-04 DIAGNOSIS — G5761 Lesion of plantar nerve, right lower limb: Secondary | ICD-10-CM

## 2019-05-04 DIAGNOSIS — G5762 Lesion of plantar nerve, left lower limb: Secondary | ICD-10-CM | POA: Diagnosis not present

## 2019-05-04 DIAGNOSIS — G5782 Other specified mononeuropathies of left lower limb: Secondary | ICD-10-CM

## 2019-05-04 DIAGNOSIS — G5781 Other specified mononeuropathies of right lower limb: Secondary | ICD-10-CM

## 2019-05-04 NOTE — Progress Notes (Signed)
He presents today for follow-up of his neuroma third interdigital space bilaterally as well as the plantar fasciitis bilaterally.  He states that both places are still bothering him.  Objective: Vital signs are stable oriented x3 still has palpable Mulder's click third interspace bilateral and still has pain on palpation medial calcaneal tubercle then much decreased from previous evaluation.  Assessment: Well-healing plantar fasciitis still active.  Neuroma third interspace bilateral rule out neuropathy.  Plan: Discussed etiology pathology conservative therapy at this point time went ahead and performed chemical destruction of the nerves with dehydrated alcohol and injected the bilateral heels 20 mg Kenalog 5 mg Marcaine point maximal tenderness bilateral.  Follow-up with him in 1 month for reevaluation treatment.

## 2019-05-08 ENCOUNTER — Other Ambulatory Visit: Payer: Self-pay

## 2019-05-08 ENCOUNTER — Ambulatory Visit: Payer: BC Managed Care – PPO | Admitting: Dermatology

## 2019-05-08 ENCOUNTER — Encounter: Payer: Self-pay | Admitting: Dermatology

## 2019-05-08 DIAGNOSIS — L089 Local infection of the skin and subcutaneous tissue, unspecified: Secondary | ICD-10-CM | POA: Diagnosis not present

## 2019-05-08 MED ORDER — SULFAMETHOXAZOLE-TRIMETHOPRIM 800-160 MG PO TABS: 1.0000 | ORAL_TABLET | Freq: Two times a day (BID) | ORAL | 1 refills | Status: DC

## 2019-05-08 NOTE — Patient Instructions (Signed)
Follow-up visit with a new problem for Mr. Karyl Kinnier.  For several months he has noted itchy to somewhat painful bumps on the back of the neck and scalp area.  Examination showed a half dozen 2 to 3 mm follicular pustules.  The largest was I indeed and bacterial culture sent.  Pending culture, he will take oral generic Bactrim DS 1 pill twice daily.  Should he develop a rash, he will discontinue it and he could call me anytime.  As a preventative measure, I suggested Mr. Jerline Pain get any over-the-counter 3 to 10% benzoyl peroxide cleanser which she can find in the acne section of most pharmacies or [place] or [place].  He will shower and wet this area and apply a little bit of the benzyl peroxide cleanser to the areas prone to breakout and let it sit for 1 to 32minutes.  He knows he has to wash this out to prevent bleaching fabrics.  Initial follow-up will be either by MyChart or by phone in 1 month.

## 2019-05-12 ENCOUNTER — Encounter: Payer: Self-pay | Admitting: Dermatology

## 2019-05-12 NOTE — Progress Notes (Signed)
   Follow-Up Visit   Subjective  TIARNAN OSTERMEYER is a 64 y.o. male who presents for the following: Annual Exam (Main concern is his scalp. Has been developing bumps for the past 6 months.  Itching and flaking with slight scabbing.  Tried switching shampoos to see if that helped but no change.).  Sores Location: Back of scalp Duration: Months Quality: Wax and wane Associated Signs/Symptoms: Pain, itching Modifying Factors:  Severity:  Timing: Context:   The following portions of the chart were reviewed this encounter and updated as appropriate:     Objective  Well appearing patient in no apparent distress; mood and affect are within normal limits.  All skin waist up examined.   Assessment & Plan  Infection of skin and subcutaneous tissue  Other Related Procedures Anaerobic and Aerobic Culture  Ordered Medications: sulfamethoxazole-trimethoprim (BACTRIM DS) 800-160 MG tablet  Follow-up visit with a new problem for Mr. Karyl Kinnier.  For several months he has noted itchy to somewhat painful bumps on the back of the neck and scalp area.  Examination showed a half dozen 2 to 3 mm follicular pustules.  The largest was I indeed and bacterial culture sent.  Pending culture, he will take oral generic Bactrim DS 1 pill twice daily.  Should he develop a rash, he will discontinue it and he could call me anytime.  As a preventative measure, I suggested Mr. Jerline Pain get any over-the-counter 3 to 10% benzoyl peroxide cleanser which she can find in the acne section of most pharmacies or [place] or [place].  He will shower and wet this area and apply a little bit of the benzyl peroxide cleanser to the areas prone to breakout and let it sit for 1 to 84minutes.  He knows he has to wash this out to prevent bleaching fabrics.  Initial follow-up will be either by MyChart or by phone in 1 month.

## 2019-05-15 LAB — ANAEROBIC AND AEROBIC CULTURE
MICRO NUMBER:: 10410611
MICRO NUMBER:: 10410612
SPECIMEN QUALITY:: ADEQUATE
SPECIMEN QUALITY:: ADEQUATE

## 2019-06-01 ENCOUNTER — Other Ambulatory Visit: Payer: Self-pay

## 2019-06-01 ENCOUNTER — Ambulatory Visit: Payer: BC Managed Care – PPO | Admitting: Podiatry

## 2019-06-01 DIAGNOSIS — G5762 Lesion of plantar nerve, left lower limb: Secondary | ICD-10-CM | POA: Diagnosis not present

## 2019-06-01 DIAGNOSIS — G5761 Lesion of plantar nerve, right lower limb: Secondary | ICD-10-CM

## 2019-06-01 DIAGNOSIS — M722 Plantar fascial fibromatosis: Secondary | ICD-10-CM

## 2019-06-01 DIAGNOSIS — B351 Tinea unguium: Secondary | ICD-10-CM | POA: Diagnosis not present

## 2019-06-01 DIAGNOSIS — M79676 Pain in unspecified toe(s): Secondary | ICD-10-CM

## 2019-06-01 DIAGNOSIS — G5782 Other specified mononeuropathies of left lower limb: Secondary | ICD-10-CM

## 2019-06-01 NOTE — Progress Notes (Signed)
He presents today for follow-up of his neuromas bilaterally states that they are doing much better as he refers to the second intermetatarsal space of the right foot third intermetatarsal space on the left foot.  States that the bilateral heels are doing much better with exception of the right 1 on the left foot has improved.  The right one is still painful.  Objective: Vital signs are stable alert oriented x3 pulses are palpable.  Has thick yellow dystrophic clinically mycotic nails 1 through 5 bilaterally.  Still has severe pain on palpation medial calcaneal tubercle bilateral and neuromas with palpable masses and Mulder's clicks to the third interdigital space in the left foot second interspace of the right foot.  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  Plan: Injected plantar fashion with 20 mg Kenalog 5 mg Marcaine injected the second and third intermetatarsal spaces right and left respectively with 2 cc of dehydrated alcohol.  I also trimmed his nails bilateral follow-up with him in a month

## 2019-06-02 ENCOUNTER — Telehealth: Payer: Self-pay | Admitting: Family Medicine

## 2019-06-02 NOTE — Telephone Encounter (Signed)
Had musculoskeletal discomfort and element of depression in jan. Having the discomfort off and on per Tanya. If pt were to develop substntial chest pain , particularly with exertion, that settles in and won't go away after a few minutes needs to go to er. As far as fatigue, heart skipping, etc. O v with dr Nicki Reaper next wk ok

## 2019-06-02 NOTE — Telephone Encounter (Signed)
Pt contacted and verbalized understanding. Pt transferred up front to schedule appt for next week.

## 2019-06-02 NOTE — Telephone Encounter (Signed)
Pt contacted office and states that he gets tired really easy, irregular heartbeat, tightness in chest on left side. Pt states that he is feeling the same way he did back in January. Pt also having drainage at night and congested in the mornings. No trouble breathing or shortness of breath at this time. Please advise. Thank you

## 2019-06-05 ENCOUNTER — Ambulatory Visit (INDEPENDENT_AMBULATORY_CARE_PROVIDER_SITE_OTHER): Payer: BC Managed Care – PPO | Admitting: Family Medicine

## 2019-06-05 ENCOUNTER — Encounter: Payer: Self-pay | Admitting: Family Medicine

## 2019-06-05 ENCOUNTER — Other Ambulatory Visit: Payer: Self-pay

## 2019-06-05 ENCOUNTER — Telehealth: Payer: Self-pay | Admitting: Family Medicine

## 2019-06-05 VITALS — BP 128/88 | Temp 95.3°F | Wt 211.2 lb

## 2019-06-05 DIAGNOSIS — R5383 Other fatigue: Secondary | ICD-10-CM

## 2019-06-05 DIAGNOSIS — I491 Atrial premature depolarization: Secondary | ICD-10-CM | POA: Diagnosis not present

## 2019-06-05 DIAGNOSIS — G4733 Obstructive sleep apnea (adult) (pediatric): Secondary | ICD-10-CM | POA: Diagnosis not present

## 2019-06-05 NOTE — Telephone Encounter (Signed)
Please advise. Thank you

## 2019-06-05 NOTE — Telephone Encounter (Signed)
Pt calling back to let Dr. Nicki Reaper know he gets his CPAP and supplies from [place]

## 2019-06-05 NOTE — Progress Notes (Signed)
   Subjective:    Patient ID: Phillip Mcguire, male    DOB: [birth year], 64 y.o.   MRN: [id number]  HPI Pt here today for fatigue. Has been going on for a few months. Feels worn out, has had some dizziness and headaches. Pt tries to eat right to help with energy. Pt was placed on Bactrim by Dr.Tafeen on 05/08/19.  Significant fatigue tiredness feels worn down at times feels run out of energy states by lunchtime he would like to take a nap he does use a CPAP on a regular basis it has not been checked in a while Patient finds himself not depressed but does have days of feeling somewhat down because of the death of his wife His appetite is doing okay occasionally he feels his heart beating a little fast or a little different but denies any chest pressure or pain  Review of Systems Patient relates a lot of fatigue tiredness finds himself feeling sleepy during the day uses his CPAP on a regular basis does not know how effective it is    Objective:   Physical Exam Lungs are clear respiratory rate is normal heart is regular pulses normal blood pressure sitting standing good abdomen soft no guarding rebound tenderness extremities no edema       Assessment & Plan:  1. PAC (premature atrial contraction) His EKG looks good no sign of atrial fibrillation continue current measures if any significant problem with rhythm patient to bring this back to our attention - PR ELECTROCARDIOGRAM, COMPLETE  2. Other fatigue Significant fatigue tiredness we will check lab work to look for anemia as well as metabolic issues and thyroid issues it is quite possible this is all related to sleep apnea - CBC with Differential/Platelet - TSH - Comprehensive Metabolic Panel (CMET)  3. OSA (obstructive sleep apnea) The sleep apnea machine has not been checked recently he will give [place] the name of the individuals who provided the machine then we will see if we can do a digital download to look at the effectiveness of the  machine may need to adjust the machine's pressure or potentially go to a newer machine - CBC with Differential/Platelet - TSH - Comprehensive Metabolic Panel (CMET) I do not find evidence of depression causing this issue

## 2019-06-06 LAB — CBC WITH DIFFERENTIAL/PLATELET
Basophils Absolute: 0.1 10*3/uL (ref 0.0–0.2)
Basos: 1 %
EOS (ABSOLUTE): 0.1 10*3/uL (ref 0.0–0.4)
Eos: 1 %
Hematocrit: 47.3 % (ref 37.5–51.0)
Hemoglobin: 16.1 g/dL (ref 13.0–17.7)
Immature Grans (Abs): 0 10*3/uL (ref 0.0–0.1)
Immature Granulocytes: 0 %
Lymphocytes Absolute: 2.9 10*3/uL (ref 0.7–3.1)
Lymphs: 32 %
MCH: 29.9 pg (ref 26.6–33.0)
MCHC: 34 g/dL (ref 31.5–35.7)
MCV: 88 fL (ref 79–97)
Monocytes Absolute: 0.6 10*3/uL (ref 0.1–0.9)
Monocytes: 7 %
Neutrophils Absolute: 5.4 10*3/uL (ref 1.4–7.0)
Neutrophils: 59 %
Platelets: 263 10*3/uL (ref 150–450)
RBC: 5.39 x10E6/uL (ref 4.14–5.80)
RDW: 13.3 % (ref 11.6–15.4)
WBC: 9.1 10*3/uL (ref 3.4–10.8)

## 2019-06-06 LAB — COMPREHENSIVE METABOLIC PANEL
ALT: 26 IU/L (ref 0–44)
AST: 23 IU/L (ref 0–40)
Albumin/Globulin Ratio: 1.9 (ref 1.2–2.2)
Albumin: 4.6 g/dL (ref 3.8–4.8)
Alkaline Phosphatase: 91 IU/L (ref 48–121)
BUN/Creatinine Ratio: 17 (ref 10–24)
BUN: 19 mg/dL (ref 8–27)
Bilirubin Total: 1 mg/dL (ref 0.0–1.2)
CO2: 25 mmol/L (ref 20–29)
Calcium: 9.7 mg/dL (ref 8.6–10.2)
Chloride: 98 mmol/L (ref 96–106)
Creatinine, Ser: 1.11 mg/dL (ref 0.76–1.27)
GFR calc Af Amer: 81 mL/min/{1.73_m2} (ref >59)
GFR calc non Af Amer: 70 mL/min/{1.73_m2} (ref >59)
Globulin, Total: 2.4 g/dL (ref 1.5–4.5)
Glucose: 78 mg/dL (ref 65–99)
Potassium: 4.3 mmol/L (ref 3.5–5.2)
Sodium: 138 mmol/L (ref 134–144)
Total Protein: 7 g/dL (ref 6.0–8.5)

## 2019-06-06 LAB — TSH: TSH: 2.08 u[IU]/mL (ref 0.450–4.500)

## 2019-06-07 NOTE — Telephone Encounter (Signed)
Did not receive fax. Called back and left a message with Alison Murray to resend report.

## 2019-06-07 NOTE — Telephone Encounter (Signed)
Patient is having significant fatigue and tiredness I believe it is because his CPAP machine is no longer working adequately  He gets his CPAP machine through [employer] This entity I am not familiar with.  Please connect with this entity see if we can get a digital download how well his machine is working also please see if the medical equipment provider company can let us know how old the machine is and what are the settings currently thank you

## 2019-06-07 NOTE — Telephone Encounter (Signed)
Called [employer] [phone] and he got his machine on 10/12/2014. They are faxing over the settings and the download. Hold message until fax comes through.

## 2019-06-08 NOTE — Telephone Encounter (Signed)
Called again and held on the phone while they sent fax and did receive this time. Fax is in dr scott's folder for review.

## 2019-06-09 ENCOUNTER — Encounter: Payer: Self-pay | Admitting: Family Medicine

## 2019-06-13 ENCOUNTER — Telehealth: Payer: Self-pay | Admitting: Family Medicine

## 2019-06-13 NOTE — Telephone Encounter (Signed)
That particular part was dependent upon the information that was sent from his sleep apnea machine company.  It did not get sent until late last week.  I reviewed over it.  It looks like the machine is working well please see other message.  Patient will need to do a sleep diary for the next 2 weeks  Also let the patient know that I did send the results of his lab through my chart-I did this back when the labs were processed several days ago-if he needs help we will be setting password with my chart have the front help him

## 2019-06-13 NOTE — Telephone Encounter (Signed)
Regarding sleep apnea-per Dr Nicki Reaper - Patient notified:  The printout was sent to [place] and Dr Nicki Reaper reviewed over it.  It appears that he utilizes the machine for approximately 5 hours and 12 minutes daily which is considered good but could be better    according to the printout that was sent to [place] the machine is actually working well and AHI is 0.2 Based upon this would not recommend switching machines.   Dr Nicki Reaper would recommend patient to do a sleep log over the next 2 weeks putting down when on average does he lay down, when on average how long it takes to fall asleep, and get up time  Patient verbalized understanding.

## 2019-06-13 NOTE — Telephone Encounter (Signed)
These labs were reviewed on 525 Please see my notes with the labs thank you

## 2019-06-13 NOTE — Telephone Encounter (Signed)
Patient would like the results of his recent labs.

## 2019-06-13 NOTE — Telephone Encounter (Signed)
The printout was sent to [place] and I reviewed over it.  It appears that he utilizes the machine for approximately 5 hours and 12 minutes daily which is considered good but could be better   according to the printout that was sent to [place] the machine is actually working well and AHI is 0.2 Based upon this would not recommend switching machines.  I would recommend patient to do a sleep log over the next 2 weeks putting down when on average does he lay down, when on average how long it takes to fall asleep, and get up time

## 2019-06-13 NOTE — Telephone Encounter (Signed)
Regarding sleep apnea-per Dr Nicki Reaper - Patient notified:   The printout was sent to [place] and Dr Nicki Reaper reviewed over it.  It appears that he utilizes the machine for approximately 5 hours and 12 minutes daily which is considered good but could be better    according to the printout that was sent to [place] the machine is actually working well and AHI is 0.2 Based upon this would not recommend switching machines.   Dr Nicki Reaper would recommend patient to do a sleep log over the next 2 weeks putting down when on average does he lay down, when on average how long it takes to fall asleep, and get up time   Patient verbalized understanding.

## 2019-06-13 NOTE — Telephone Encounter (Signed)
Blood work results per Dr Nicki Reaper:   Blood work looks good Not anemic, thyroid looks good, sugar kidney function liver function looks good  Patient notified of results and verbalzied understanding.

## 2019-06-24 ENCOUNTER — Other Ambulatory Visit: Payer: Self-pay | Admitting: Family Medicine

## 2019-06-29 ENCOUNTER — Ambulatory Visit: Payer: BC Managed Care – PPO | Admitting: Podiatry

## 2019-06-29 ENCOUNTER — Other Ambulatory Visit: Payer: Self-pay

## 2019-06-29 DIAGNOSIS — G5761 Lesion of plantar nerve, right lower limb: Secondary | ICD-10-CM | POA: Diagnosis not present

## 2019-06-29 DIAGNOSIS — M722 Plantar fascial fibromatosis: Secondary | ICD-10-CM | POA: Diagnosis not present

## 2019-06-29 DIAGNOSIS — G5762 Lesion of plantar nerve, left lower limb: Secondary | ICD-10-CM | POA: Diagnosis not present

## 2019-07-01 NOTE — Progress Notes (Signed)
He presents today for follow-up of his plantar fasciitis and his neuromas.  He states that the neuroma was are still painful but the plantar fasciitis is starting to get better.  Objective: Neuroma type symptoms to the second interdigital space rather than the third interdigital space at this point.  Still has palpable Mulder's click in the same area.  Also has some plantar fasciitis symptoms on medial lateral compression of the calcaneus is negative though he does have pain on palpation medial calcaneal tubercle.  Assessment: Resolving plantar fasciitis neuroma second interspace all bilateral.  Plan: Injected dehydrated alcohol to the second interspace bilateral.  Injected plantar fascia bilaterally with 20 mg Kenalog 5 mg Marcaine point of maximal tenderness.  Tolerated these procedures well and will follow up with him in 3 to 4 weeks.

## 2019-07-24 ENCOUNTER — Telehealth: Payer: Self-pay | Admitting: Family Medicine

## 2019-07-24 DIAGNOSIS — E7849 Other hyperlipidemia: Secondary | ICD-10-CM

## 2019-07-24 DIAGNOSIS — Z0000 Encounter for general adult medical examination without abnormal findings: Secondary | ICD-10-CM

## 2019-07-24 DIAGNOSIS — Z125 Encounter for screening for malignant neoplasm of prostate: Secondary | ICD-10-CM

## 2019-07-24 NOTE — Telephone Encounter (Signed)
Pt made appt for Phy on 8/23 will he need blood work?

## 2019-07-24 NOTE — Telephone Encounter (Signed)
Last labs 06/05/2019: CMP, TSH, CBC

## 2019-07-30 NOTE — Telephone Encounter (Signed)
Patient had recent blood work Recommend lipid and PSA

## 2019-07-31 ENCOUNTER — Other Ambulatory Visit: Payer: Self-pay | Admitting: *Deleted

## 2019-07-31 DIAGNOSIS — Z125 Encounter for screening for malignant neoplasm of prostate: Secondary | ICD-10-CM

## 2019-07-31 DIAGNOSIS — E7849 Other hyperlipidemia: Secondary | ICD-10-CM

## 2019-07-31 NOTE — Telephone Encounter (Signed)
Patient notified, labs ordered.

## 2019-08-08 ENCOUNTER — Ambulatory Visit: Payer: BC Managed Care – PPO | Admitting: Podiatry

## 2019-08-08 ENCOUNTER — Encounter: Payer: Self-pay | Admitting: Podiatry

## 2019-08-08 ENCOUNTER — Other Ambulatory Visit: Payer: Self-pay

## 2019-08-08 VITALS — Temp 97.9°F

## 2019-08-08 DIAGNOSIS — G5762 Lesion of plantar nerve, left lower limb: Secondary | ICD-10-CM | POA: Diagnosis not present

## 2019-08-08 DIAGNOSIS — M722 Plantar fascial fibromatosis: Secondary | ICD-10-CM

## 2019-08-08 DIAGNOSIS — G5761 Lesion of plantar nerve, right lower limb: Secondary | ICD-10-CM

## 2019-08-08 MED ORDER — GABAPENTIN 100 MG PO CAPS: ORAL_CAPSULE | ORAL | 0 refills | Status: DC

## 2019-08-09 NOTE — Progress Notes (Signed)
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  He is also having some heel pain.  States that the injections do seem to help and he would like to continue those.  Objective: Vital signs are stable he is alert oriented x3 has strong palpable pulses bilateral.  He has pain on palpation between the second and third toes and third and fourth toes bilaterally he also has plantar fascial insertion pain.  Assessment: Plantar fasciitis.  Neuromas bilaterally.  Plan: Discussed etiology pathology conservative surgical therapies at this point I went ahead and injected the bilateral heels today as well as the second intermetatarsal and third intermetatarsal spaces look dehydrated alcohol.  I would like to follow-up with him in about month to 6 weeks.

## 2019-09-05 ENCOUNTER — Ambulatory Visit (INDEPENDENT_AMBULATORY_CARE_PROVIDER_SITE_OTHER): Payer: BC Managed Care – PPO | Admitting: Family Medicine

## 2019-09-05 ENCOUNTER — Other Ambulatory Visit: Payer: Self-pay

## 2019-09-05 ENCOUNTER — Encounter: Payer: Self-pay | Admitting: Family Medicine

## 2019-09-05 VITALS — BP 122/70 | HR 60 | Temp 97.4°F | Ht 71.0 in | Wt 207.0 lb

## 2019-09-05 DIAGNOSIS — Z0000 Encounter for general adult medical examination without abnormal findings: Secondary | ICD-10-CM

## 2019-09-05 NOTE — Progress Notes (Signed)
   Subjective:    Patient ID: Phillip Mcguire, male    DOB: 04-05-1955, 64 y.o.   MRN: [id number]  HPI The patient comes in today for a wellness visit.  Medications reviewed with patient he relates compliance Patient is trying to eat healthy Unable to do a lot of exercise because of his work schedule Denies depression issues currently but still under a lot of stress with the loss of his wife earlier this year  A review of their health history was completed.  A review of medications was also completed.  Any needed refills; none  Eating habits: not health conscious  Falls/  MVA accidents in past few months: none  Regular exercise: walking  Specialist pt sees on regular basis: Dr Milinda Pointer - podiatry  Preventative health issues were discussed.   Additional concerns: fluid in ears     Review of Systems  Constitutional: Negative for diaphoresis and fatigue.  HENT: Negative for congestion and rhinorrhea.   Respiratory: Negative for cough and shortness of breath.   Cardiovascular: Negative for chest pain and leg swelling.  Gastrointestinal: Negative for abdominal pain and diarrhea.  Skin: Negative for color change and rash.  Neurological: Negative for dizziness and headaches.  Psychiatric/Behavioral: Negative for behavioral problems and confusion.       Objective:   Physical Exam Vitals reviewed.  Constitutional:      General: He is not in acute distress. HENT:     Head: Normocephalic and atraumatic.  Eyes:     General:        Right eye: No discharge.        Left eye: No discharge.  Neck:     Trachea: No tracheal deviation.  Cardiovascular:     Rate and Rhythm: Normal rate and regular rhythm.     Heart sounds: Normal heart sounds. No murmur heard.   Pulmonary:     Effort: Pulmonary effort is normal. No respiratory distress.     Breath sounds: Normal breath sounds.  Genitourinary:    Prostate: Normal.  Lymphadenopathy:     Cervical: No cervical adenopathy.  Skin:     General: Skin is warm and dry.  Neurological:     Mental Status: He is alert.     Coordination: Coordination normal.  Psychiatric:        Behavior: Behavior normal.     Prostate exam normal      Assessment & Plan:  Adult wellness-complete.wellness physical was conducted today. Importance of diet and exercise were discussed in detail.  In addition to this a discussion regarding safety was also covered. We also reviewed over immunizations and gave recommendations regarding current immunization needed for age.  In addition to this additional areas were also touched on including: Preventative health exams needed:  Colonoscopy 2027  Patient was advised yearly wellness exam

## 2019-09-05 NOTE — Patient Instructions (Signed)
Results for orders placed or performed in visit on 06/05/19  CBC with Differential/Platelet  Result Value Ref Range   WBC 9.1 3.4 - 10.8 x10E3/uL   RBC 5.39 4.14 - 5.80 x10E6/uL   Hemoglobin 16.1 13.0 - 17.7 g/dL   Hematocrit 47.3 37.5 - 51.0 %   MCV 88 79 - 97 fL   MCH 29.9 26.6 - 33.0 pg   MCHC 34.0 31 - 35 g/dL   RDW 13.3 11.6 - 15.4 %   Platelets 263 150 - 450 x10E3/uL   Neutrophils 59 Not Estab. %   Lymphs 32 Not Estab. %   Monocytes 7 Not Estab. %   Eos 1 Not Estab. %   Basos 1 Not Estab. %   Neutrophils Absolute 5.4 1 - 7 x10E3/uL   Lymphocytes Absolute 2.9 0 - 3 x10E3/uL   Monocytes Absolute 0.6 0 - 0 x10E3/uL   EOS (ABSOLUTE) 0.1 0.0 - 0.4 x10E3/uL   Basophils Absolute 0.1 0 - 0 x10E3/uL   Immature Granulocytes 0 Not Estab. %   Immature Grans (Abs) 0.0 0.0 - 0.1 x10E3/uL  TSH  Result Value Ref Range   TSH 2.080 0.450 - 4.500 uIU/mL  Comprehensive Metabolic Panel (CMET)  Result Value Ref Range   Glucose 78 65 - 99 mg/dL   BUN 19 8 - 27 mg/dL   Creatinine, Ser 1.11 0.76 - 1.27 mg/dL   GFR calc non Af Amer 70 >59 mL/min/1.73   GFR calc Af Amer 81 >59 mL/min/1.73   BUN/Creatinine Ratio 17 10 - 24   Sodium 138 134 - 144 mmol/L   Potassium 4.3 3.5 - 5.2 mmol/L   Chloride 98 96 - 106 mmol/L   CO2 25 20 - 29 mmol/L   Calcium 9.7 8.6 - 10.2 mg/dL   Total Protein 7.0 6.0 - 8.5 g/dL   Albumin 4.6 3.8 - 4.8 g/dL   Globulin, Total 2.4 1.5 - 4.5 g/dL   Albumin/Globulin Ratio 1.9 1.2 - 2.2   Bilirubin Total 1.0 0.0 - 1.2 mg/dL   Alkaline Phosphatase 91 48 - 121 IU/L   AST 23 0 - 40 IU/L   ALT 26 0 - 44 IU/L

## 2019-09-06 LAB — LIPID PANEL
Chol/HDL Ratio: 2.7 ratio (ref 0.0–5.0)
Cholesterol, Total: 146 mg/dL (ref 100–199)
HDL: 54 mg/dL (ref >39)
LDL Chol Calc (NIH): 78 mg/dL (ref 0–99)
Triglycerides: 68 mg/dL (ref 0–149)
VLDL Cholesterol Cal: 14 mg/dL (ref 5–40)

## 2019-09-06 LAB — PSA: Prostate Specific Ag, Serum: 2.2 ng/mL (ref 0.0–4.0)

## 2019-09-07 ENCOUNTER — Other Ambulatory Visit: Payer: Self-pay | Admitting: *Deleted

## 2019-09-07 MED ORDER — ALBUTEROL SULFATE HFA 108 (90 BASE) MCG/ACT IN AERS: INHALATION_SPRAY | RESPIRATORY_TRACT | 3 refills | Status: DC

## 2019-09-14 DIAGNOSIS — H6240 Otitis externa in other diseases classified elsewhere, unspecified ear: Secondary | ICD-10-CM | POA: Insufficient documentation

## 2019-09-14 DIAGNOSIS — B369 Superficial mycosis, unspecified: Secondary | ICD-10-CM | POA: Insufficient documentation

## 2019-09-28 ENCOUNTER — Telehealth: Payer: Self-pay | Admitting: Family Medicine

## 2019-09-28 NOTE — Telephone Encounter (Signed)
Tried to call and no answer. Looks like dr Nicki Reaper wrote a note to Phillip Mcguire with results. Not sure why Phillip Mcguire did not get it.

## 2019-09-28 NOTE — Telephone Encounter (Signed)
Pt had a Phy on 08/24 and wanted to know the results of his blood work. Pt [phone]

## 2019-10-02 NOTE — Telephone Encounter (Signed)
Patient notified of results. Please send results to Dr. Joetta Manners- [facility] in [place].

## 2019-10-10 ENCOUNTER — Other Ambulatory Visit: Payer: Self-pay

## 2019-10-10 ENCOUNTER — Ambulatory Visit: Payer: BC Managed Care – PPO | Admitting: Podiatry

## 2019-10-10 DIAGNOSIS — G5762 Lesion of plantar nerve, left lower limb: Secondary | ICD-10-CM

## 2019-10-10 DIAGNOSIS — G5761 Lesion of plantar nerve, right lower limb: Secondary | ICD-10-CM | POA: Diagnosis not present

## 2019-10-10 DIAGNOSIS — M722 Plantar fascial fibromatosis: Secondary | ICD-10-CM | POA: Diagnosis not present

## 2019-10-11 NOTE — Progress Notes (Signed)
He presents today for follow-up of his plantar fasciitis and his neuromas.  States that they are both still giving me a lot of grief.  Objective: Vital signs are stable alert and oriented x3.  Pulses are palpable.  There is still pain on palpation to the second interdigital space bilaterally lesser to the third.  He still has marked pain on palpation medial calcaneal tubercles.  Assessment: Plantar fasciitis bilateral.  Neuromas second interspace bilateral.  Plan: At this point injected 2 cc 4% dehydrated alcohol to the second interdigital space bilaterally and 20 mg Kenalog 5 mg Marcaine medial aspect of the bilateral heel.

## 2019-12-04 ENCOUNTER — Ambulatory Visit (INDEPENDENT_AMBULATORY_CARE_PROVIDER_SITE_OTHER): Payer: BC Managed Care – PPO | Admitting: Family Medicine

## 2019-12-04 ENCOUNTER — Other Ambulatory Visit: Payer: Self-pay

## 2019-12-04 ENCOUNTER — Encounter: Payer: Self-pay | Admitting: Family Medicine

## 2019-12-04 ENCOUNTER — Ambulatory Visit (HOSPITAL_COMMUNITY)
Admission: RE | Admit: 2019-12-04 | Discharge: 2019-12-04 | Disposition: A | Discharge status: Home / Self Care | Payer: BC Managed Care – PPO | Source: Ambulatory Visit | Attending: Family Medicine | Admitting: Family Medicine

## 2019-12-04 VITALS — BP 124/80 | HR 73 | Temp 97.2°F | Ht 71.0 in | Wt 217.0 lb

## 2019-12-04 DIAGNOSIS — R059 Cough, unspecified: Secondary | ICD-10-CM | POA: Insufficient documentation

## 2019-12-04 DIAGNOSIS — Z8616 Personal history of COVID-19: Secondary | ICD-10-CM | POA: Diagnosis not present

## 2019-12-04 MED ORDER — BREO ELLIPTA 100-25 MCG/INH IN AEPB: INHALATION_SPRAY | RESPIRATORY_TRACT | 5 refills | Status: DC

## 2019-12-04 NOTE — Progress Notes (Addendum)
   Subjective:    Patient ID: Phillip Mcguire, male    DOB: 07-17-1955, 64 y.o.   MRN: [id number]  HPImed check up.   Dry Cough for over one year off and on since having covid in sept 2020.  Patient with ongoing dry cough.  He denies shortness of breath denies chest tightness pressure pain denies regurgitation reflux issues denies heartburn denies postnasal drip tried Flonase it did not really seem to help he denies wheezing or shortness of breath with activity.  Dry cough multiple times throughout the day.  Patient states this is been ongoing since having Covid  Review of Systems  Constitutional: Negative for activity change, fatigue and fever.  HENT: Negative for congestion and rhinorrhea.   Respiratory: Positive for cough. Negative for shortness of breath.   Cardiovascular: Negative for chest pain and leg swelling.  Gastrointestinal: Negative for abdominal pain, diarrhea and nausea.  Genitourinary: Negative for dysuria and hematuria.  Neurological: Negative for weakness and headaches.  Psychiatric/Behavioral: Negative for agitation and behavioral problems.       Objective:   Physical Exam Vitals reviewed.  Constitutional:      General: He is not in acute distress. HENT:     Head: Normocephalic and atraumatic.  Eyes:     General:        Right eye: No discharge.        Left eye: No discharge.  Neck:     Trachea: No tracheal deviation.  Cardiovascular:     Rate and Rhythm: Normal rate and regular rhythm.     Heart sounds: Normal heart sounds. No murmur heard.   Pulmonary:     Effort: Pulmonary effort is normal. No respiratory distress.     Breath sounds: Normal breath sounds.  Lymphadenopathy:     Cervical: No cervical adenopathy.  Skin:    General: Skin is warm and dry.  Neurological:     Mental Status: He is alert.     Coordination: Coordination normal.  Psychiatric:        Behavior: Behavior normal.    Denies hemoptysis denies purulent sputum denies fever chills  sweats denies weight loss       Assessment & Plan:  Cough - Plan: DG Chest 2 View  History of 2019 novel coronavirus disease (COVID-19) ,med Chest x-ray ordered await the results Also recommend going ahead with a combination inhaler to see if this helps Patient to give [place] update in 3 to 4 weeks If not significant improvement referral to pulmonary Hold off on any type of CAT scan currently Follow-up for regular checkups come springtime  He does sleep with his head of bed propped up on blocks therefore less likely for this to be reflux Also he tried treating the postnasal drip and it did not help therefore less likely to be postnasal drip but it is possible he could be having a combination going on

## 2019-12-12 ENCOUNTER — Other Ambulatory Visit: Payer: Self-pay

## 2019-12-12 ENCOUNTER — Encounter: Payer: Self-pay | Admitting: Podiatry

## 2019-12-12 ENCOUNTER — Ambulatory Visit (INDEPENDENT_AMBULATORY_CARE_PROVIDER_SITE_OTHER): Payer: BC Managed Care – PPO | Admitting: Podiatry

## 2019-12-12 DIAGNOSIS — M722 Plantar fascial fibromatosis: Secondary | ICD-10-CM

## 2019-12-12 MED ORDER — TRIAMCINOLONE ACETONIDE 40 MG/ML IJ SUSP: 20.0000 mg | Freq: Once | INTRAMUSCULAR | Status: AC

## 2019-12-12 NOTE — Progress Notes (Signed)
He presents today for follow-up of his bilateral plantar fasciitis and bilateral neuromas.  States that really the numbness on the bottom of the foot and the right heel pain is the most worrisome.  Objective: Vital signs are stable he is alert oriented x3 there is no erythema edema cellulitis drainage or odor.  He has pain on palpation medial calcaneal tubercle of the right heel.  No pain on palpation of the intermetatarsal spaces.  Assessment: Resolving neuromas neuritis bilateral foot resolving plantar fasciitis left.  Plan fasciitis right.  Neuropathy.  Plan: Discussed etiology pathology conservative therapies at this point time and reinjected the heel with 20 mg Kenalog 5 mg of Marcaine at the point of maximal tenderness.  I also recommended that he continue his 100 mg of gabapentin 3 times a day.  I also recommended that he purchase a new pair of shoes before changing orthotics.  Follow-up with him in about 3 to 4 weeks.

## 2019-12-15 ENCOUNTER — Emergency Department (HOSPITAL_COMMUNITY)
Admission: EM | Admit: 2019-12-15 | Discharge: 2019-12-15 | Disposition: A | Discharge status: Home / Self Care | Payer: BC Managed Care – PPO | Attending: Emergency Medicine | Admitting: Emergency Medicine

## 2019-12-15 ENCOUNTER — Other Ambulatory Visit: Payer: Self-pay

## 2019-12-15 ENCOUNTER — Encounter (HOSPITAL_COMMUNITY): Payer: Self-pay | Admitting: *Deleted

## 2019-12-15 ENCOUNTER — Emergency Department (HOSPITAL_COMMUNITY): Payer: BC Managed Care – PPO

## 2019-12-15 DIAGNOSIS — R42 Dizziness and giddiness: Secondary | ICD-10-CM | POA: Diagnosis present

## 2019-12-15 DIAGNOSIS — E876 Hypokalemia: Secondary | ICD-10-CM | POA: Diagnosis not present

## 2019-12-15 DIAGNOSIS — Z7901 Long term (current) use of anticoagulants: Secondary | ICD-10-CM | POA: Diagnosis not present

## 2019-12-15 DIAGNOSIS — Z87891 Personal history of nicotine dependence: Secondary | ICD-10-CM | POA: Insufficient documentation

## 2019-12-15 DIAGNOSIS — Z91040 Latex allergy status: Secondary | ICD-10-CM | POA: Insufficient documentation

## 2019-12-15 DIAGNOSIS — I4891 Unspecified atrial fibrillation: Secondary | ICD-10-CM | POA: Diagnosis not present

## 2019-12-15 LAB — CBC
HCT: 46.7 % (ref 39.0–52.0)
Hemoglobin: 15.7 g/dL (ref 13.0–17.0)
MCH: 30 pg (ref 26.0–34.0)
MCHC: 33.6 g/dL (ref 30.0–36.0)
MCV: 89.1 fL (ref 80.0–100.0)
Platelets: 245 10*3/uL (ref 150–400)
RBC: 5.24 MIL/uL (ref 4.22–5.81)
RDW: 12.8 % (ref 11.5–15.5)
WBC: 7.8 10*3/uL (ref 4.0–10.5)
nRBC: 0 % (ref 0.0–0.2)

## 2019-12-15 LAB — TSH: TSH: 1.217 u[IU]/mL (ref 0.350–4.500)

## 2019-12-15 LAB — BASIC METABOLIC PANEL
Anion gap: 10 (ref 5–15)
BUN: 23 mg/dL (ref 8–23)
CO2: 26 mmol/L (ref 22–32)
Calcium: 9.4 mg/dL (ref 8.9–10.3)
Chloride: 102 mmol/L (ref 98–111)
Creatinine, Ser: 1.03 mg/dL (ref 0.61–1.24)
GFR, Estimated: >60 mL/min (ref >60)
Glucose, Bld: 166 mg/dL — ABNORMAL HIGH (ref 70–99)
Potassium: 3.4 mmol/L — ABNORMAL LOW (ref 3.5–5.1)
Sodium: 138 mmol/L (ref 135–145)

## 2019-12-15 LAB — TROPONIN I (HIGH SENSITIVITY)
Troponin I (High Sensitivity): 9 ng/L (ref <18)
Troponin I (High Sensitivity): 16 ng/L (ref <18)

## 2019-12-15 LAB — MAGNESIUM: Magnesium: 1.8 mg/dL (ref 1.7–2.4)

## 2019-12-15 MED ORDER — POTASSIUM CHLORIDE ER 20 MEQ PO TBCR: 20.0000 meq | EXTENDED_RELEASE_TABLET | Freq: Two times a day (BID) | ORAL | 0 refills | Status: DC

## 2019-12-15 MED ORDER — ETOMIDATE 2 MG/ML IV SOLN: 10.0000 mg | Freq: Once | INTRAVENOUS | Status: DC

## 2019-12-15 MED ORDER — SODIUM CHLORIDE 0.9 % IV BOLUS
1000.0000 mL | Freq: Once | INTRAVENOUS | Status: AC
  Administered 2019-12-15: 1000 mL via INTRAVENOUS

## 2019-12-15 MED ORDER — APIXABAN 5 MG PO TABS: 5.0000 mg | ORAL_TABLET | Freq: Two times a day (BID) | ORAL | 0 refills | Status: DC

## 2019-12-15 MED ORDER — POTASSIUM CHLORIDE 10 MEQ/100ML IV SOLN
10.0000 meq | Freq: Once | INTRAVENOUS | Status: AC
  Administered 2019-12-15: 10 meq via INTRAVENOUS
  Filled 2019-12-15: qty 100

## 2019-12-15 MED ORDER — METOPROLOL TARTRATE 5 MG/5ML IV SOLN
5.0000 mg | Freq: Once | INTRAVENOUS | Status: AC
  Administered 2019-12-15: 5 mg via INTRAVENOUS
  Filled 2019-12-15: qty 5

## 2019-12-15 NOTE — ED Triage Notes (Signed)
Pt with left cp and sob for past 30 minutes while driving and then pt began to feel like he was going to pass out.

## 2019-12-15 NOTE — ED Provider Notes (Signed)
[facility] EMERGENCY DEPARTMENT Provider Note   CSN: [id number] Arrival date & time: 12/15/19  1455     History Chief Complaint  Patient presents with  . Near Syncope    Phillip Mcguire is a 64 y.o. male.  HPI Patient presents with dull chest pain lightheadedness.  Began around 30 minutes prior to arrival.  States he felt like it got a pass out.  States he did have a little episode earlier today when he was outside where he felt lightheaded but thought it may have just been due to the heat.  EKG upon arrival shows A. fib with RVR.  No history of this.  States he has not felt his heart racing really before this episode today.  No weight loss.  No fevers.  States he did have a [employer] with lunch.  No fevers or chills.  No coughing.  States he did get his Covid shot on Wednesday.    Past Medical History:  Diagnosis Date  . Abnormal LFTs    mild elevation of total, indirect and direct bilirubin  . Allergy   . Congenital absence of kidney    Right  . GERD (gastroesophageal reflux disease)   . Hearing loss   . Sleep apnea     Patient Active Problem List   Diagnosis Date Noted  . Encounter for screening colonoscopy 12/25/2015  . Hemorrhoids 12/25/2015  . Chronic swimmer's ear of both sides 04/16/2015  . Chronic pansinusitis 03/25/2015  . Perennial allergic rhinitis 03/25/2015  . Bilateral chronic secretory otitis media 03/11/2015  . Dysfunction of both eustachian tubes 03/11/2015  . GERD (gastroesophageal reflux disease) 06/10/2014  . Chronic lumbar pain 04/20/2014  . Prediabetes 10/20/2012  . Peripheral neuropathy 10/20/2012  . OSA (obstructive sleep apnea) 07/30/2010  . Hyperlipidemia 01/29/2009  . Hearing loss 01/25/2009  . PAIN IN JOINT, SITE UNSPECIFIED 01/25/2009  . CHEST PAIN UNSPECIFIED 01/25/2009    Past Surgical History:  Procedure Laterality Date  . COLONOSCOPY  2007   [place]k: normal  . COLONOSCOPY N/A 12/26/2015   Procedure: COLONOSCOPY;  Surgeon:  Daneil Dolin, MD;  Location: [facility];  Service: Endoscopy;  Laterality: N/A;  2:00 PM  . ESOPHAGOGASTRODUODENOSCOPY  01/2002   Dr. Deatra Ina: Normal  . NAILBED REPAIR Right 06/14/2018   Procedure: RECONSTRUCTION NAILBED WITH PALMARIS GRAFT RIGHT THUMB;  Surgeon: Daryll Brod, MD;  Location: [facility];  Service: Orthopedics;  Laterality: Right;  AXILLARY BLOCK  . NASAL SEPTUM SURGERY  1980's  . otosclerosis     bilateral ear surgery  . SHOULDER ARTHROSCOPY W/ ROTATOR CUFF REPAIR Left 2003  . titanium implant Left March 2010   left middle ear       Family History  Problem Relation Age of Onset  . Heart disease Mother   . Hypertension Mother   . Diabetes Mother   . Hyperlipidemia Mother   . Heart disease Father   . Lung cancer Father   . Hyperlipidemia Father   . Diabetes Father   . Allergies Son   . Cancer Maternal Aunt        breast  . Cancer Maternal Uncle        colon  . Cancer Paternal Aunt        breast  . Cancer Paternal Uncle        brain tumor  . Diabetes Sister   . Neuropathy Neg Hx     Social History   Tobacco Use  . Smoking  status: Former Smoker    Packs/day: 1.50    Years: 15.00    Pack years: 22.50    Types: Cigarettes    Quit date: 01/13/1988    Years since quitting: 31.9  . Smokeless tobacco: Former Network engineer  . Vaping Use: Never used  Substance Use Topics  . Alcohol use: Yes    Alcohol/week: 0.0 standard drinks    Comment: rarely drink beer  . Drug use: No    Home Medications Prior to Admission medications   Medication Sig Start Date End Date Taking? Authorizing Provider  albuterol (VENTOLIN HFA) 108 (90 Base) MCG/ACT inhaler Inhale 2 puffs into lungs QID prn for wheezing/cough 09/07/19   Kathyrn Drown, MD  apixaban (ELIQUIS) 5 MG TABS tablet Take 1 tablet (5 mg total) by mouth 2 (two) times daily. 12/15/19 01/14/20  Davonna Belling, MD  atorvastatin (LIPITOR) 10 MG tablet TAKE 1 TABLET BY MOUTH EVERY DAY 06/26/19    Kathyrn Drown, MD  CALCIUM-VITAMIN D PO Take 1 tablet by mouth daily. 200 units calcium, 1100units vit D    [provider]  Cholecalciferol (VITAMIN D) 2000 units CAPS Take 2,000 Units by mouth daily.    [provider]  fluticasone (FLONASE) 50 MCG/ACT nasal spray SPRAY 2 SPRAYS INTO EACH NOSTRIL EVERY DAY 06/01/17   Kathyrn Drown, MD  fluticasone furoate-vilanterol (BREO ELLIPTA) 100-25 MCG/INH AEPB 1 inhalation q am 12/04/19   Kathyrn Drown, MD  gabapentin (NEURONTIN) 100 MG capsule TAKE 1 CAPSULE BY MOUTH EVERY MORNING AND 2 CAPSULES EVERY EVENING AND 3 CAPSULES AT [facility] 08/08/19   Hyatt, Max T, DPM  loratadine (CLARITIN) 10 MG tablet Take 10 mg by mouth daily.    [provider]  Magnesium Oxide 200 MG TABS Take 200 mg by mouth daily.    [provider]  Multiple Vitamin (MULTIVITAMIN) capsule Take 1 capsule by mouth daily.      [provider]  omeprazole (PRILOSEC) 40 MG capsule Take 40 mg by mouth 2 (two) times daily.    [provider]  OVER THE COUNTER MEDICATION Zinc niacin    [provider]  potassium chloride 20 MEQ TBCR Take 20 mEq by mouth 2 (two) times daily. 12/15/19   Davonna Belling, MD  Probiotic Product (PROBIOTIC PO) Take 1 Dose by mouth daily. 300mg     [provider]  vitamin C (ASCORBIC ACID) 500 MG tablet Take 500 mg by mouth daily.     [provider]    Allergies    Latex  Review of Systems   Review of Systems  Constitutional: Negative for appetite change.  HENT: Negative for congestion.   Respiratory: Negative for shortness of breath.   Cardiovascular: Positive for chest pain and palpitations.  Gastrointestinal: Negative for abdominal pain.  Genitourinary: Negative for flank pain.  Musculoskeletal: Negative for back pain.  Skin: Negative for rash.  Neurological: Positive for light-headedness.  Psychiatric/Behavioral: Negative for confusion.    Physical Exam Updated  Vital Signs BP 103/68   Pulse 62   Temp 97.6 F (36.4 C) (Oral)   Resp 14   SpO2 98%   Physical Exam Vitals reviewed.  HENT:     Head: Atraumatic.     Mouth/Throat:     Mouth: Mucous membranes are moist.  Eyes:     Extraocular Movements: Extraocular movements intact.  Cardiovascular:     Rate and Rhythm: Tachycardia present. Rhythm irregular.  Pulmonary:     Breath  sounds: No wheezing or rhonchi.  Abdominal:     Tenderness: There is no abdominal tenderness.  Musculoskeletal:        General: No tenderness.     Cervical back: Neck supple.  Skin:    General: Skin is warm.     Capillary Refill: Capillary refill takes less than 2 seconds.  Neurological:     Mental Status: He is alert and oriented to person, place, and time.     ED Results / Procedures / Treatments   Labs (all labs ordered are listed, but only abnormal results are displayed) Labs Reviewed  BASIC METABOLIC PANEL - Abnormal; Notable for the following components:      Result Value   Potassium 3.4 (*)    Glucose, Bld 166 (*)    All other components within normal limits  CBC  TSH  MAGNESIUM  TROPONIN I (HIGH SENSITIVITY)  TROPONIN I (HIGH SENSITIVITY)    EKG EKG Interpretation  Date/Time:  Friday December 15 2019 15:00:34 EST Ventricular Rate:  153 PR Interval:    QRS Duration: 86 QT Interval:  278 QTC Calculation: 443 R Axis:   69 Text Interpretation: Atrial fibrillation with rapid ventricular response with premature ventricular or aberrantly conducted complexes Nonspecific ST abnormality Abnormal QRS-T angle, consider primary T wave abnormality Abnormal ECG No old tracing to compare Confirmed by Davonna Belling [phone]) on 12/15/2019 3:41:05 PM   Radiology DG Chest 2 View  Result Date: 12/15/2019 CLINICAL DATA:  Left-sided chest pain and shortness of breath 30 minutes while driving. EXAM: CHEST - 2 VIEW COMPARISON:  12/04/2019 FINDINGS: Lungs are adequately inflated without focal airspace  consolidation or effusion. Cardiomediastinal silhouette and remainder of the exam is unchanged. IMPRESSION: No active cardiopulmonary disease. Electronically Signed   By: Marin Olp M.D.   On: 12/15/2019 15:50    Procedures Procedures (including critical care time)  Medications Ordered in ED Medications  metoprolol tartrate (LOPRESSOR) injection 5 mg (5 mg Intravenous Given 12/15/19 1549)  sodium chloride 0.9 % bolus 1,000 mL (0 mLs Intravenous Stopped 12/15/19 1737)  potassium chloride 10 mEq in 100 mL IVPB (0 mEq Intravenous Stopped 12/15/19 1822)    ED Course  I have reviewed the triage vital signs and the nursing notes.  Pertinent labs & imaging results that were available during my care of the patient were reviewed by me and considered in my medical decision making (see chart for details).    MDM Rules/Calculators/A&P                          Patient presents feeling bad.  Found to be in A. fib RVR.  I think this is likely the cause of the feeling bad.  Feels much better after he spontaneously converted back to a sinus rhythm after getting 5 mg of metoprolol.  However was some mild bradycardia with that.  CHA2DS2-VASc score of 0.  Will however start anticoagulation since he did have the conversion to sinus rhythm.  Will have follow-up with A. fib clinic.  Imaging reassuring.  Mild hypokalemia will be supplemented.  CRITICAL CARE Performed by: Davonna Belling Total critical care time: 30 minutes Critical care time was exclusive of separately billable procedures and treating other patients. Critical care was necessary to treat or prevent imminent or life-threatening deterioration. Critical care was time spent personally by me on the following activities: development of treatment plan with patient and/or surrogate as well as nursing, discussions with consultants, evaluation of  patient's response to treatment, examination of patient, obtaining history from patient or surrogate,  ordering and performing treatments and interventions, ordering and review of laboratory studies, ordering and review of radiographic studies, pulse oximetry and re-evaluation of patient's condition.  Final Clinical Impression(s) / ED Diagnoses Final diagnoses:  Atrial fibrillation with rapid ventricular response ([place])  Hypokalemia    Rx / DC Orders ED Discharge Orders         Ordered    apixaban (ELIQUIS) 5 MG TABS tablet  2 times daily        12/15/19 1917    potassium chloride 20 MEQ TBCR  2 times daily        12/15/19 1924           Davonna Belling, MD 12/15/19 1924

## 2019-12-19 ENCOUNTER — Encounter (HOSPITAL_COMMUNITY): Payer: Self-pay | Admitting: Nurse Practitioner

## 2019-12-19 ENCOUNTER — Ambulatory Visit (HOSPITAL_COMMUNITY)
Admission: RE | Admit: 2019-12-19 | Discharge: 2019-12-19 | Disposition: A | Discharge status: Home / Self Care | Payer: BC Managed Care – PPO | Source: Ambulatory Visit | Attending: Nurse Practitioner | Admitting: Nurse Practitioner

## 2019-12-19 ENCOUNTER — Other Ambulatory Visit: Payer: Self-pay

## 2019-12-19 VITALS — BP 126/80 | HR 58 | Ht 71.0 in | Wt 215.4 lb

## 2019-12-19 DIAGNOSIS — I4891 Unspecified atrial fibrillation: Secondary | ICD-10-CM | POA: Diagnosis present

## 2019-12-19 DIAGNOSIS — Z7901 Long term (current) use of anticoagulants: Secondary | ICD-10-CM | POA: Insufficient documentation

## 2019-12-19 DIAGNOSIS — I480 Paroxysmal atrial fibrillation: Secondary | ICD-10-CM | POA: Diagnosis not present

## 2019-12-19 MED ORDER — DILTIAZEM HCL 30 MG PO TABS: ORAL_TABLET | ORAL | 1 refills | Status: DC

## 2019-12-19 NOTE — Patient Instructions (Signed)
Cardizem 30mg  -- take 1 tablet every 4 hours AS NEEDED for heart rate >100 as long as top of blood pressure >100.

## 2019-12-19 NOTE — Progress Notes (Signed)
Primary Care Physician: Kathyrn Drown, MD Referring Physician:[facility] f/u ER visit   Phillip Mcguire is a 64 y.o. male with a h/o OSA that presented to the [facility] ER with lightheadedness/ chest discomfort and was found to be in new onset afib. He is now in the afib clinic for f/u. Ekg shows SR. He has not noted any further afib. No alcohol, tobaccos, moderate caffeine, uses cpap for OSA.   Today, he denies symptoms of palpitations, chest pain, shortness of breath, orthopnea, PND, lower extremity edema, dizziness, presyncope, syncope, or neurologic sequela. The patient is tolerating medications without difficulties and is otherwise without complaint today.   Past Medical History:  Diagnosis Date  . Abnormal LFTs    mild elevation of total, indirect and direct bilirubin  . Allergy   . Congenital absence of kidney    Right  . GERD (gastroesophageal reflux disease)   . Hearing loss   . Sleep apnea    Past Surgical History:  Procedure Laterality Date  . COLONOSCOPY  2007   Rourk: normal  . COLONOSCOPY N/A 12/26/2015   Procedure: COLONOSCOPY;  Surgeon: Daneil Dolin, MD;  [facility];  Service: Endoscopy;  Laterality: N/A;  2:00 PM  . ESOPHAGOGASTRODUODENOSCOPY  01/2002   Dr. Deatra Ina: Normal  . NAILBED REPAIR Right 06/14/2018   Procedure: RECONSTRUCTION NAILBED WITH PALMARIS GRAFT RIGHT THUMB;  Surgeon: Daryll Brod, MD;  Location: [facility];  Service: Orthopedics;  Laterality: Right;  AXILLARY BLOCK  . NASAL SEPTUM SURGERY  1980's  . otosclerosis     bilateral ear surgery  . SHOULDER ARTHROSCOPY W/ ROTATOR CUFF REPAIR Left 2003  . titanium implant Left March 2010   left middle ear    Current Outpatient Medications  Medication Sig Dispense Refill  . albuterol (VENTOLIN HFA) 108 (90 Base) MCG/ACT inhaler Inhale 2 puffs into lungs QID prn for wheezing/cough 18 g 3  . apixaban (ELIQUIS) 5 MG TABS tablet Take 1 tablet (5 mg total) by mouth 2 (two) times daily. 60  tablet 0  . atorvastatin (LIPITOR) 10 MG tablet TAKE 1 TABLET BY MOUTH EVERY DAY 90 tablet 1  . CALCIUM-VITAMIN D PO Take 1 tablet by mouth daily. 200 units calcium, 1100units vit D    . Cholecalciferol (VITAMIN D) 2000 units CAPS Take 2,000 Units by mouth daily.    . clotrimazole (LOTRIMIN) 1 % external solution Instill 4 drops twice daily into affected ear(s).    . clotrimazole-betamethasone (LOTRISONE) lotion Apply topically.    . famotidine (PEPCID) 40 MG tablet Take 40 mg by mouth daily.    . fluticasone (FLONASE) 50 MCG/ACT nasal spray SPRAY 2 SPRAYS INTO EACH NOSTRIL EVERY DAY 16 g 2  . fluticasone furoate-vilanterol (BREO ELLIPTA) 100-25 MCG/INH AEPB 1 inhalation q am (Patient taking differently: 1 inhalation as needed in the am) 28 each 5  . gabapentin (NEURONTIN) 100 MG capsule TAKE 1 CAPSULE BY MOUTH EVERY MORNING AND 2 CAPSULES EVERY EVENING AND 3 CAPSULES AT BEDITME (Patient taking differently: TAKE 1 CAPSULE BY MOUTH EVERY MORNING AND 1 CAPSULE EVERY EVENING AND 1 CAPSULE AT BEDITME) 540 capsule 0  . loratadine (CLARITIN) 10 MG tablet Take 10 mg by mouth daily.    . Magnesium Oxide 200 MG TABS Take 200 mg by mouth daily.    . Multiple Vitamin (MULTIVITAMIN) capsule Take 1 capsule by mouth daily.      . niacin 50 MG tablet Take 50 mg by mouth daily.    Marland Kitchen  omeprazole (PRILOSEC) 20 MG capsule Take 20 mg by mouth daily.     Marland Kitchen OVER THE COUNTER MEDICATION Take 1 tablet by mouth daily. Zinc    . potassium chloride 20 MEQ TBCR Take 20 mEq by mouth 2 (two) times daily. 10 tablet 0  . Probiotic Product (PROBIOTIC PO) Take 1 Dose by mouth daily. 300mg     . vitamin C (ASCORBIC ACID) 500 MG tablet Take 500 mg by mouth daily.      Current Facility-Administered Medications  Medication Dose Route Frequency Provider Last Rate Last Admin  . triamcinolone acetonide (KENALOG-40) injection 20 mg  20 mg Other Once Hull, Max T, DPM        Allergies  Allergen Reactions  . Latex Rash    Social  History   Socioeconomic History  . Marital status: Widowed    Spouse name: [name]  . Number of children: 1  . Years of education: 72  . Highest education level: Not on file  Occupational History  . 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    Comment: [employer]  Tobacco Use  . Smoking status: Former Smoker    Packs/day: 1.50    Years: 15.00    Pack years: 22.50    Types: Cigarettes    Quit date: 01/13/1988    Years since quitting: 31.9  . Smokeless tobacco: Former Network engineer  . Vaping Use: Never used  Substance and Sexual Activity  . Alcohol use: Yes    Alcohol/week: 0.0 standard drinks    Comment: rarely drink beer  . Drug use: No  . Sexual activity: Yes    Partners: Female  Other Topics Concern  . Not on file  Social History Narrative   He works in [employer], Energy manager   Lives with wife.     Caffeine use: 2 cups coffee per day   Social Determinants of Health   Financial Resource Strain:   . Difficulty of Paying Living Expenses: Not on file  Food Insecurity:   . Worried About Charity fundraiser in the Last Year: Not on file  . Ran Out of Food in the Last Year: Not on file  Transportation Needs:   . Lack of Transportation (Medical): Not on file  . Lack of Transportation (Non-Medical): Not on file  Physical Activity:   . Days of Exercise per Week: Not on file  . Minutes of Exercise per Session: Not on file  Stress:   . Feeling of Stress : Not on file  Social Connections:   . Frequency of Communication with Friends and Family: Not on file  . Frequency of Social Gatherings with Friends and Family: Not on file  . Attends Religious Services: Not on file  . Active Member of Clubs or Organizations: Not on file  . Attends Archivist Meetings: Not on file  . Marital Status: Not on file  Intimate Partner Violence:   . Fear of Current or Ex-Partner: Not on file  . Emotionally Abused: Not on file  . Physically Abused: Not on file  . Sexually  Abused: Not on file    Family History  Problem Relation Age of Onset  . Heart disease Mother   . Hypertension Mother   . Diabetes Mother   . Hyperlipidemia Mother   . Heart disease Father   . Lung cancer Father   . Hyperlipidemia Father   . Diabetes Father   . Allergies Son   . Cancer Maternal Aunt  breast  . Cancer Maternal Uncle        colon  . Cancer Paternal Aunt        breast  . Cancer Paternal Uncle        brain tumor  . Diabetes Sister   . Neuropathy Neg Hx     ROS- All systems are reviewed and negative except as per the HPI above  Physical Exam: Vitals:   12/19/19 0940  Pulse: (!) 58  Weight: 97.7 kg  Height: 5\' 11"  (1.803 m)   Wt Readings from Last 3 Encounters:  12/19/19 97.7 kg  12/04/19 98.4 kg  09/05/19 93.9 kg    Labs: Lab Results  Component Value Date   NA 138 12/15/2019   K 3.4 (L) 12/15/2019   CL 102 12/15/2019   CO2 26 12/15/2019   GLUCOSE 166 (H) 12/15/2019   BUN 23 12/15/2019   CREATININE 1.03 12/15/2019   CALCIUM 9.4 12/15/2019   MG 1.8 12/15/2019   No results found for: INR Lab Results  Component Value Date   CHOL 146 09/05/2019   HDL 54 09/05/2019   LDLCALC 78 09/05/2019   TRIG 68 09/05/2019     GEN- The patient is well appearing, alert and oriented x 3 today.   Head- normocephalic, atraumatic Eyes-  Sclera clear, conjunctiva pink Ears- hearing intact Oropharynx- clear Neck- supple, no JVP Lymph- no cervical lymphadenopathy Lungs- Clear to ausculation bilaterally, normal work of breathing Heart- Regular rate and rhythm, no murmurs, rubs or gallops, PMI not laterally displaced GI- soft, NT, ND, + BS Extremities- no clubbing, cyanosis, or edema MS- no significant deformity or atrophy Skin- no rash or lesion Psych- euthymic mood, full affect Neuro- strength and sensation are intact  EKG-sinus bradycardia at 59 bpm    Assessment and Plan: 1. New onset afib General  education re afb Asked to reduce coffee  intake  Triggers discussed  Will RX 30 mg cardizem with instructions how to use for breakthrough afib Echo ordered  Work note to return  to work  2. CHA2DS2VASc score of 0 For now continue anticoagulation, eliquis 5 mg bid,  but will not require long term   F/u in 3 weeks   Butch Penny C. Mallary Kreger, [facility] [address] [place], [place] [address] [phone]

## 2019-12-20 ENCOUNTER — Encounter (HOSPITAL_COMMUNITY): Payer: Self-pay

## 2019-12-22 DIAGNOSIS — H7010 Chronic mastoiditis, unspecified ear: Secondary | ICD-10-CM | POA: Insufficient documentation

## 2019-12-22 DIAGNOSIS — Z9089 Acquired absence of other organs: Secondary | ICD-10-CM | POA: Insufficient documentation

## 2019-12-22 DIAGNOSIS — H90A32 Mixed conductive and sensorineural hearing loss, unilateral, left ear with restricted hearing on the contralateral side: Secondary | ICD-10-CM | POA: Insufficient documentation

## 2019-12-27 ENCOUNTER — Ambulatory Visit (HOSPITAL_COMMUNITY)
Admission: RE | Admit: 2019-12-27 | Discharge: 2019-12-27 | Disposition: A | Discharge status: Home / Self Care | Payer: BC Managed Care – PPO | Source: Ambulatory Visit | Attending: Nurse Practitioner | Admitting: Nurse Practitioner

## 2019-12-27 ENCOUNTER — Other Ambulatory Visit: Payer: Self-pay

## 2019-12-27 DIAGNOSIS — I480 Paroxysmal atrial fibrillation: Secondary | ICD-10-CM | POA: Diagnosis present

## 2019-12-27 LAB — ECHOCARDIOGRAM COMPLETE
Area-P 1/2: 3.48 cm2
S' Lateral: 4.3 cm

## 2019-12-27 NOTE — Progress Notes (Signed)
  Echocardiogram 2D Echocardiogram with 3D has been performed.  Phillip Mcguire M 12/27/2019, 3:05 PM

## 2019-12-29 ENCOUNTER — Encounter (HOSPITAL_COMMUNITY): Payer: Self-pay | Admitting: *Deleted

## 2019-12-29 ENCOUNTER — Other Ambulatory Visit (HOSPITAL_COMMUNITY): Payer: Self-pay | Admitting: *Deleted

## 2019-12-31 ENCOUNTER — Telehealth: Payer: Self-pay | Admitting: Family Medicine

## 2019-12-31 DIAGNOSIS — E876 Hypokalemia: Secondary | ICD-10-CM

## 2019-12-31 NOTE — Telephone Encounter (Signed)
Nurses Patient recently seen by cardiology for atrial fibrillation.  They are doing a follow-up in a few weeks.  They are recommending a follow-up ultrasound in 1 year.  Please put this into the reminder file.  Hard to know if cardiology will do this or not and I do not want this to fall through the cracks.  Please touch base with patient let him know that we are aware of his ER visit for atrial fibrillation as well as the visit with cardiology.  Cardiology will be following this closely initially and then more than likely turn this over to [place] with cardiology doing a yearly visit if all goes well I would like for the patient to follow-up with [place] in the early spring regarding his cholesterol and also reassess how things are going for him should he need [place] sooner that would be fine.  Also when he was in the ER his potassium was low he should do a repeat metabolic 7, and magnesium at his convenience this week if possible-diagnosis hypokalemia  So therefore schedule him for March or April to be seen please put into reminder file for him to have lipid, metabolic 7 before that visit (I would not recommend putting this lab work into the system currently because [facility] will mess it up based drawing the wrong labs)  If any questions concerns please let me know

## 2020-01-01 NOTE — Telephone Encounter (Signed)
Patient notified and stated that Cardiology was going to repeat blood work at his visit next week but he can go tomorrow to have it done and is following up closely with them and will follow up with Dr Nicki Reaper in the spring. Echo added to [place] file. Patient to call back to schedule follow up office visit in the spring. Blood work ordered in [employer]

## 2020-01-01 NOTE — Telephone Encounter (Signed)
Echo thank you

## 2020-01-01 NOTE — Addendum Note (Signed)
Addended by: Dairl Ponder on: 01/01/2020 04:44 PM   Modules accepted: Orders

## 2020-01-03 LAB — BASIC METABOLIC PANEL
BUN/Creatinine Ratio: 16 (ref 10–24)
BUN: 15 mg/dL (ref 8–27)
CO2: 26 mmol/L (ref 20–29)
Calcium: 9.6 mg/dL (ref 8.6–10.2)
Chloride: 101 mmol/L (ref 96–106)
Creatinine, Ser: 0.95 mg/dL (ref 0.76–1.27)
GFR calc Af Amer: 97 mL/min/{1.73_m2} (ref >59)
GFR calc non Af Amer: 84 mL/min/{1.73_m2} (ref >59)
Glucose: 120 mg/dL — ABNORMAL HIGH (ref 65–99)
Potassium: 4.2 mmol/L (ref 3.5–5.2)
Sodium: 140 mmol/L (ref 134–144)

## 2020-01-03 LAB — MAGNESIUM: Magnesium: 1.7 mg/dL (ref 1.6–2.3)

## 2020-01-10 ENCOUNTER — Ambulatory Visit (HOSPITAL_COMMUNITY)
Admission: RE | Admit: 2020-01-10 | Discharge: 2020-01-10 | Disposition: A | Discharge status: Home / Self Care | Payer: BC Managed Care – PPO | Source: Ambulatory Visit | Attending: Nurse Practitioner | Admitting: Nurse Practitioner

## 2020-01-10 ENCOUNTER — Encounter (HOSPITAL_COMMUNITY): Payer: Self-pay | Admitting: Nurse Practitioner

## 2020-01-10 ENCOUNTER — Other Ambulatory Visit: Payer: Self-pay

## 2020-01-10 VITALS — BP 108/64 | HR 67 | Ht 71.0 in | Wt 218.8 lb

## 2020-01-10 DIAGNOSIS — Z79899 Other long term (current) drug therapy: Secondary | ICD-10-CM | POA: Diagnosis not present

## 2020-01-10 DIAGNOSIS — D6869 Other thrombophilia: Secondary | ICD-10-CM | POA: Diagnosis not present

## 2020-01-10 DIAGNOSIS — Z8249 Family history of ischemic heart disease and other diseases of the circulatory system: Secondary | ICD-10-CM | POA: Insufficient documentation

## 2020-01-10 DIAGNOSIS — Z7901 Long term (current) use of anticoagulants: Secondary | ICD-10-CM | POA: Diagnosis not present

## 2020-01-10 DIAGNOSIS — I480 Paroxysmal atrial fibrillation: Secondary | ICD-10-CM | POA: Insufficient documentation

## 2020-01-10 DIAGNOSIS — Z87891 Personal history of nicotine dependence: Secondary | ICD-10-CM | POA: Insufficient documentation

## 2020-01-10 DIAGNOSIS — G4733 Obstructive sleep apnea (adult) (pediatric): Secondary | ICD-10-CM | POA: Insufficient documentation

## 2020-01-10 NOTE — Progress Notes (Signed)
Primary Care Physician: Babs Sciara, MD Referring Physician:[facility] f/u ER visit   Phillip Mcguire is a 64 y.o. male with a h/o OSA that presented to the [facility] ER with lightheadedness/ chest discomfort and was found to be in new onset afib. He is now in the afib clinic for f/u. Ekg shows SR. He has not noted any further afib. No alcohol, tobaccos, moderate caffeine, uses cpap for OSA.   F/u in afib clinic, 01/10/20, he  has not noted any further  afib.  He can stop anticoagulation for a CHA2DS2VASc score of 0. Reviewed echo with pt. 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 He notices mild lightheadedness, sometime in the am, but when  he checks his BP and HR there is no correlation with normal numbers.   Today, he denies symptoms of palpitations, chest pain, shortness of breath, orthopnea, PND, lower extremity edema, dizziness, presyncope, syncope, or neurologic sequela. The patient is tolerating medications without difficulties and is otherwise without complaint today.   Past Medical History:  Diagnosis Date  . Abnormal LFTs    mild elevation of total, indirect and direct bilirubin  . Allergy   . Congenital absence of kidney    Right  . GERD (gastroesophageal reflux disease)   . Hearing loss   . Sleep apnea    Past Surgical History:  Procedure Laterality Date  . COLONOSCOPY  2007   Rourk: normal  . COLONOSCOPY N/A 12/26/2015   Procedure: COLONOSCOPY;  Surgeon: Corbin Ade, MD;  [facility];  Service: Endoscopy;  Laterality: N/A;  2:00 PM  . ESOPHAGOGASTRODUODENOSCOPY  01/2002   Dr. Arlyce Dice: Normal  . NAILBED REPAIR Right 06/14/2018   Procedure: RECONSTRUCTION NAILBED WITH PALMARIS GRAFT RIGHT THUMB;  Surgeon: Cindee Salt, MD;  [facility];  Service: Orthopedics;  Laterality: Right;  AXILLARY BLOCK  . NASAL SEPTUM SURGERY  1980's  . otosclerosis     bilateral ear surgery  . SHOULDER  ARTHROSCOPY W/ ROTATOR CUFF REPAIR Left 2003  . titanium implant Left March 2010   left middle ear    Current Outpatient Medications  Medication Sig Dispense Refill  . albuterol (VENTOLIN HFA) 108 (90 Base) MCG/ACT inhaler Inhale 2 puffs into lungs QID prn for wheezing/cough 18 g 3  . apixaban (ELIQUIS) 5 MG TABS tablet Take 1 tablet (5 mg total) by mouth 2 (two) times daily. 60 tablet 0  . atorvastatin (LIPITOR) 10 MG tablet TAKE 1 TABLET BY MOUTH EVERY DAY 90 tablet 1  . CALCIUM-VITAMIN D PO Take 1 tablet by mouth daily. 200 units calcium, 1100units vit D    . Cholecalciferol (VITAMIN D) 2000 units CAPS Take 2,000 Units by mouth daily.    . clotrimazole (LOTRIMIN) 1 % external solution Instill 4 drops twice daily into affected ear(s).    . clotrimazole-betamethasone (LOTRISONE) lotion Apply topically.    Marland Kitchen diltiazem (CARDIZEM) 30 MG tablet Take 1 tablet every 4 hours AS NEEDED for AFIB heart rate >100 30 tablet 1  . famotidine (PEPCID) 40 MG tablet Take 40 mg by mouth daily.    . fluticasone (FLONASE) 50 MCG/ACT nasal spray SPRAY 2 SPRAYS INTO EACH NOSTRIL EVERY DAY 16 g 2  . fluticasone furoate-vilanterol (BREO ELLIPTA) 100-25 MCG/INH AEPB 1 inhalation q am (Patient taking differently: 1 inhalation as needed in the am) 28 each 5  . gabapentin (NEURONTIN) 100 MG capsule TAKE 1 CAPSULE BY MOUTH  EVERY MORNING AND 2 CAPSULES EVERY EVENING AND 3 CAPSULES AT BEDITME (Patient taking differently: TAKE 1 CAPSULE BY MOUTH EVERY MORNING AND 1 CAPSULE EVERY EVENING AND 1 CAPSULE AT BEDITME) 540 capsule 0  . loratadine (CLARITIN) 10 MG tablet Take 10 mg by mouth daily.    . Magnesium Oxide 200 MG TABS Take 200 mg by mouth daily.    . Multiple Vitamin (MULTIVITAMIN) capsule Take 1 capsule by mouth daily.      . niacin 50 MG tablet Take 50 mg by mouth daily.    Marland Kitchen omeprazole (PRILOSEC) 20 MG capsule Take 20 mg by mouth daily.     Marland Kitchen OVER THE COUNTER MEDICATION Take 1 tablet by mouth daily. Zinc    .  potassium chloride 20 MEQ TBCR Take 20 mEq by mouth 2 (two) times daily. 10 tablet 0  . Probiotic Product (PROBIOTIC PO) Take 1 Dose by mouth daily. 300mg     . vitamin C (ASCORBIC ACID) 500 MG tablet Take 500 mg by mouth daily.      Current Facility-Administered Medications  Medication Dose Route Frequency Provider Last Rate Last Admin  . triamcinolone acetonide (KENALOG-40) injection 20 mg  20 mg Other Once [place]hill, Max T, DPM        Allergies  Allergen Reactions  . Latex Rash    Social History   Socioeconomic History  . Marital status: Widowed    Spouse name: [name]  . Number of children: 1  . Years of education: 30  . Highest education level: Not on file  Occupational History  . 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    Comment: [employer]  Tobacco Use  . Smoking status: Former Smoker    Packs/day: 1.50    Years: 15.00    Pack years: 22.50    Types: Cigarettes    Quit date: 01/13/1988    Years since quitting: 32.0  . Smokeless tobacco: Former Network engineer  . Vaping Use: Never used  Substance and Sexual Activity  . Alcohol use: Yes    Alcohol/week: 0.0 standard drinks    Comment: rarely drink beer  . Drug use: No  . Sexual activity: Yes    Partners: Female  Other Topics Concern  . Not on file  Social History Narrative   He works in [employer], Energy manager   Lives with wife.     Caffeine use: 2 cups coffee per day   Social Determinants of Health   Financial Resource Strain: Not on file  Food Insecurity: Not on file  Transportation Needs: Not on file  Physical Activity: Not on file  Stress: Not on file  Social Connections: Not on file  Intimate Partner Violence: Not on file    Family History  Problem Relation Age of Onset  . Heart disease Mother   . Hypertension Mother   . Diabetes Mother   . Hyperlipidemia Mother   . Heart disease Father   . Lung cancer Father   . Hyperlipidemia Father   . Diabetes Father   . Allergies Son   . Cancer  Maternal Aunt        breast  . Cancer Maternal Uncle        colon  . Cancer Paternal Aunt        breast  . Cancer Paternal Uncle        brain tumor  . Diabetes Sister   . Neuropathy Neg Hx     ROS- All systems are reviewed and negative  except as per the HPI above  Physical Exam: There were no vitals filed for this visit. Wt Readings from Last 3 Encounters:  12/19/19 97.7 kg  12/04/19 98.4 kg  09/05/19 93.9 kg    Labs: Lab Results  Component Value Date   NA 140 01/02/2020   K 4.2 01/02/2020   CL 101 01/02/2020   CO2 26 01/02/2020   GLUCOSE 120 (H) 01/02/2020   BUN 15 01/02/2020   CREATININE 0.95 01/02/2020   CALCIUM 9.6 01/02/2020   MG 1.7 01/02/2020   No results found for: INR Lab Results  Component Value Date   CHOL 146 09/05/2019   HDL 54 09/05/2019   LDLCALC 78 09/05/2019   TRIG 68 09/05/2019     GEN- The patient is well appearing, alert and oriented x 3 today.   Head- normocephalic, atraumatic Eyes-  Sclera clear, conjunctiva pink Ears- hearing intact Oropharynx- clear Neck- supple, no JVP Lymph- no cervical lymphadenopathy Lungs- Clear to ausculation bilaterally, normal work of breathing Heart- Regular rate and rhythm, no murmurs, rubs or gallops, PMI not laterally displaced GI- soft, NT, ND, + BS Extremities- no clubbing, cyanosis, or edema MS- no significant deformity or atrophy Skin- no rash or lesion Psych- euthymic mood, full affect Neuro- strength and sensation are intact  EKG-sinus bradycardia at 59 bpm Echo-1. Left ventricular ejection fraction, by estimation, is 50 to 55%. The left ventricle has low normal function. The left ventricle has no regional wall motion abnormalities. The left ventricular internal cavity size was mildly dilated. Left ventricular diastolic parameters are consistent with Grade I diastolic dysfunction (impaired relaxation). The average left ventricular global longitudinal strain is -18.3 %. The global longitudinal  strain is normal. 2. Right ventricular systolic function is normal. The right ventricular size is normal. 3. The mitral valve is normal in structure. Trivial mitral valve regurgitation. No evidence of mitral stenosis. 4. The aortic valve is tricuspid. Aortic valve regurgitation is not visualized. No aortic stenosis is present. 5. Aortic dilatation noted. There is mild dilatation at the level of the sinuses of Valsalva measuring 37 mm. 6. The inferior vena cava is normal in size with greater than 50% respiratory variability, suggesting right atrial pressure of 3 mmHg   Assessment and Plan: 1. New onset afib No  reoccurrence  Has 30 mg cardizem with instructions how to use for breakthrough afib   2. CHA2DS2VASc score of 0 Can stop eliquis   F/u in  general cardiology in 3-4 months to establish to follow dilated aorta, other cardiac concerns   Geroge Baseman. Oshua Mcguire, [facility] [address] [place], [place] [address] [phone]

## 2020-01-15 ENCOUNTER — Ambulatory Visit: Payer: BC Managed Care – PPO | Admitting: Cardiology

## 2020-01-15 ENCOUNTER — Other Ambulatory Visit: Payer: Self-pay

## 2020-01-15 NOTE — Progress Notes (Deleted)
Primary Care Physician: Kathyrn Drown, MD Referring Physician:MCG f/u ER visit   Phillip Mcguire is a 65 y.o. male with a h/o OSA that presented to the [facility] ER with lightheadedness/ chest discomfort and was found to be in new onset afib. He is now in the afib clinic for f/u. Ekg shows SR. He has not noted any further afib. No alcohol, tobaccos, moderate caffeine, uses cpap for OSA.   Today, he denies symptoms of palpitations, chest pain, shortness of breath, orthopnea, PND, lower extremity edema, dizziness, presyncope, syncope, or neurologic sequela. The patient is tolerating medications without difficulties and is otherwise without complaint today.   Past Medical History:  Diagnosis Date  . Abnormal LFTs    mild elevation of total, indirect and direct bilirubin  . Allergy   . Congenital absence of kidney    Right  . GERD (gastroesophageal reflux disease)   . Hearing loss   . Sleep apnea    Past Surgical History:  Procedure Laterality Date  . COLONOSCOPY  2007   Rourk: normal  . COLONOSCOPY N/A 12/26/2015   Procedure: COLONOSCOPY;  Surgeon: Daneil Dolin, MD;  [facility];  Service: Endoscopy;  Laterality: N/A;  2:00 PM  . ESOPHAGOGASTRODUODENOSCOPY  01/2002   Dr. Deatra Ina: Normal  . NAILBED REPAIR Right 06/14/2018   Procedure: RECONSTRUCTION NAILBED WITH PALMARIS GRAFT RIGHT THUMB;  Surgeon: Daryll Brod, MD;  Location: [facility];  Service: Orthopedics;  Laterality: Right;  AXILLARY BLOCK  . NASAL SEPTUM SURGERY  1980's  . otosclerosis     bilateral ear surgery  . SHOULDER ARTHROSCOPY W/ ROTATOR CUFF REPAIR Left 2003  . titanium implant Left March 2010   left middle ear    Current Outpatient Medications  Medication Sig Dispense Refill  . albuterol (VENTOLIN HFA) 108 (90 Base) MCG/ACT inhaler Inhale 2 puffs into lungs QID prn for wheezing/cough 18 g 3  . atorvastatin (LIPITOR) 10 MG tablet TAKE 1 TABLET BY MOUTH EVERY DAY 90 tablet 1  .  CALCIUM-VITAMIN D PO Take 1 tablet by mouth daily. 200 units calcium, 1100units vit D    . Cholecalciferol (VITAMIN D) 2000 units CAPS Take 2,000 Units by mouth daily.    . ciprofloxacin-dexamethasone (CIPRODEX) OTIC suspension     . clotrimazole (LOTRIMIN) 1 % external solution Instill 4 drops twice daily into affected ear(s).    . clotrimazole-betamethasone (LOTRISONE) lotion Apply topically.    Marland Kitchen diltiazem (CARDIZEM) 30 MG tablet Take 1 tablet every 4 hours AS NEEDED for AFIB heart rate >100 30 tablet 1  . doxycycline (VIBRAMYCIN) 100 MG capsule Take 100 mg by mouth 2 (two) times daily.    . famotidine (PEPCID) 40 MG tablet Take 40 mg by mouth daily.    . fluticasone (FLONASE) 50 MCG/ACT nasal spray SPRAY 2 SPRAYS INTO EACH NOSTRIL EVERY DAY 16 g 2  . gabapentin (NEURONTIN) 100 MG capsule TAKE 1 CAPSULE BY MOUTH EVERY MORNING AND 2 CAPSULES EVERY EVENING AND 3 CAPSULES AT BEDITME 540 capsule 0  . loratadine (CLARITIN) 10 MG tablet Take 10 mg by mouth daily.    . Magnesium Oxide 200 MG TABS Take 200 mg by mouth daily.    . Multiple Vitamin (MULTIVITAMIN) capsule Take 1 capsule by mouth daily.    . mupirocin ointment (BACTROBAN) 2 % Apply 1 application topically 3 (three) times daily.    . niacin 50 MG tablet Take 50 mg by mouth daily.    Marland Kitchen omeprazole (PRILOSEC)  20 MG capsule Take 20 mg by mouth daily.     Marland Kitchen OVER THE COUNTER MEDICATION Take 1 tablet by mouth daily. Zinc    . Probiotic Product (PROBIOTIC PO) Take 1 Dose by mouth daily. 300mg     . vitamin C (ASCORBIC ACID) 500 MG tablet Take 500 mg by mouth daily.     Current Facility-Administered Medications  Medication Dose Route Frequency Provider Last Rate Last Admin  . triamcinolone acetonide (KENALOG-40) injection 20 mg  20 mg Other Once [place], Max T, DPM        Allergies  Allergen Reactions  . Latex Rash    Social History   Socioeconomic History  . Marital status: Widowed    Spouse name: [name]  . Number of children: 1  .  Years of education: 19  . Highest education level: Not on file  Occupational History  . 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    Comment: [employer]  Tobacco Use  . Smoking status: Former Smoker    Packs/day: 1.50    Years: 15.00    Pack years: 22.50    Types: Cigarettes    Quit date: 01/13/1988    Years since quitting: 32.0  . Smokeless tobacco: Former Network engineer  . Vaping Use: Never used  Substance and Sexual Activity  . Alcohol use: Yes    Alcohol/week: 0.0 standard drinks    Comment: rarely drink beer  . Drug use: No  . Sexual activity: Yes    Partners: Female  Other Topics Concern  . Not on file  Social History Narrative   He works in [employer], Energy manager   Lives with wife.     Caffeine use: 2 cups coffee per day   Social Determinants of Health   Financial Resource Strain: Not on file  Food Insecurity: Not on file  Transportation Needs: Not on file  Physical Activity: Not on file  Stress: Not on file  Social Connections: Not on file  Intimate Partner Violence: Not on file    Family History  Problem Relation Age of Onset  . Heart disease Mother   . Hypertension Mother   . Diabetes Mother   . Hyperlipidemia Mother   . Heart disease Father   . Lung cancer Father   . Hyperlipidemia Father   . Diabetes Father   . Allergies Son   . Cancer Maternal Aunt        breast  . Cancer Maternal Uncle        colon  . Cancer Paternal Aunt        breast  . Cancer Paternal Uncle        brain tumor  . Diabetes Sister   . Neuropathy Neg Hx     ROS- All systems are reviewed and negative except as per the HPI above  Physical Exam: There were no vitals filed for this visit. Wt Readings from Last 3 Encounters:  01/10/20 218 lb 12.8 oz (99.2 kg)  12/19/19 215 lb 6.4 oz (97.7 kg)  12/04/19 217 lb (98.4 kg)    Labs: Lab Results  Component Value Date   NA 140 01/02/2020   K 4.2 01/02/2020   CL 101 01/02/2020   CO2 26 01/02/2020   GLUCOSE 120  (H) 01/02/2020   BUN 15 01/02/2020   CREATININE 0.95 01/02/2020   CALCIUM 9.6 01/02/2020   MG 1.7 01/02/2020   No results found for: INR Lab Results  Component Value Date   CHOL 146 09/05/2019  HDL 54 09/05/2019   LDLCALC 78 09/05/2019   TRIG 68 09/05/2019     GEN- The patient is well appearing, alert and oriented x 3 today.   Head- normocephalic, atraumatic Eyes-  Sclera clear, conjunctiva pink Ears- hearing intact Oropharynx- clear Neck- supple, no JVP Lymph- no cervical lymphadenopathy Lungs- Clear to ausculation bilaterally, normal work of breathing Heart- Regular rate and rhythm, no murmurs, rubs or gallops, PMI not laterally displaced GI- soft, NT, ND, + BS Extremities- no clubbing, cyanosis, or edema MS- no significant deformity or atrophy Skin- no rash or lesion Psych- euthymic mood, full affect Neuro- strength and sensation are intact  EKG-sinus bradycardia at 59 bpm    Assessment and Plan: 1. New onset afib General  education re afb Asked to reduce coffee intake  Triggers discussed  Will RX 30 mg cardizem with instructions how to use for breakthrough afib Echo ordered  Work note to return  to work  2. CHA2DS2VASc score of 0 For now continue anticoagulation, eliquis 5 mg bid,  but will not require long term   F/u in 3 weeks   Lupita Leash C. [facility] [address] [place], [place] [address] [phone]

## 2020-01-16 ENCOUNTER — Ambulatory Visit: Payer: BC Managed Care – PPO | Admitting: Podiatry

## 2020-02-21 ENCOUNTER — Other Ambulatory Visit: Payer: Self-pay | Admitting: Family Medicine

## 2020-02-21 DIAGNOSIS — E7849 Other hyperlipidemia: Secondary | ICD-10-CM

## 2020-02-21 DIAGNOSIS — E876 Hypokalemia: Secondary | ICD-10-CM

## 2020-02-21 NOTE — Telephone Encounter (Signed)
May have 90-day, lipid, CMP with office visit in the spring please

## 2020-03-06 ENCOUNTER — Encounter: Payer: Self-pay | Admitting: Family Medicine

## 2020-03-06 ENCOUNTER — Other Ambulatory Visit: Payer: Self-pay

## 2020-03-06 ENCOUNTER — Ambulatory Visit (INDEPENDENT_AMBULATORY_CARE_PROVIDER_SITE_OTHER): Payer: BC Managed Care – PPO | Admitting: Family Medicine

## 2020-03-06 VITALS — BP 130/88 | HR 70 | Temp 97.0°F | Wt 220.4 lb

## 2020-03-06 DIAGNOSIS — R0609 Other forms of dyspnea: Secondary | ICD-10-CM

## 2020-03-06 DIAGNOSIS — R0600 Dyspnea, unspecified: Secondary | ICD-10-CM | POA: Diagnosis not present

## 2020-03-06 NOTE — Progress Notes (Signed)
   Subjective:    Patient ID: Phillip Mcguire, male    DOB: [birth year], 64 y.o.   MRN: [id number]  Hyperlipidemia This is a chronic problem. There are no compliance problems.   Pt taking Lipitor 10 mg daily.  Pt states his breathing is better but not as well as he hoped it would be by now.   He is here for follow-up still has some shortness of breath with activity states when he goes up steps carrying weight he gets out of breath denies chest pressure with it.  He feels he gets out of breath too quickly  denies any recent respiratory symptoms no cough wheezing or difficulty breathing.  Uses albuterol infrequently.  Patient has upcoming appointment with cardiology.  Review of Systems Please see above    Objective:   Physical Exam  Lungs clear respiratory rate normal heart regular HEENT benign blood pressure good   Echo was reviewed.  Does show diastolic dysfunction.  Also shows aortic outflow dilated recommend a follow-up ultrasound echo in 1 years time     Assessment & Plan:  Hyperlipidemia continue current medication repeat lab work closer to his wellness exam Healthy diet regular activity recommended Shortness of breath with activity patient will be seeing cardiology in the near future.  Hopefully they could see him sooner he would benefit from having a stress Myoview or something similar if coronary arteries look good then more than likely this is related to deconditioning or possibly developing COPD Healthy diet regular physical activity lose weight recommended

## 2020-03-10 ENCOUNTER — Telehealth: Payer: Self-pay | Admitting: Family Medicine

## 2020-03-10 NOTE — Telephone Encounter (Signed)
Nurses Patient now having DOE.  Has appointment with cardiology early April.  Please see if Loma Sousa can get him into cardiology before early April.  I sent a staff message to the cardiologist for early April.  Essentially I got a kind reply-" thanks for letting us know" without really any commitment about seeing him sooner.  With him having DOE I am concerned about the possibility of underlying heart disease and I was hopeful that they would be able to see him before early April.  All we can do is ask.    Please let the patient know that we have put in this request but essentially it falls under the purview of cardiology on whether or not they are able to work him in sooner.  Thank you

## 2020-03-11 ENCOUNTER — Other Ambulatory Visit: Payer: Self-pay | Admitting: *Deleted

## 2020-03-11 DIAGNOSIS — R0609 Other forms of dyspnea: Secondary | ICD-10-CM

## 2020-03-11 DIAGNOSIS — R0600 Dyspnea, unspecified: Secondary | ICD-10-CM

## 2020-03-11 NOTE — Telephone Encounter (Signed)
Called and discussed with pt. And put in a new urgent referral and copied your note in the referral comment so courtney could see it.

## 2020-03-21 ENCOUNTER — Other Ambulatory Visit: Payer: Self-pay

## 2020-03-21 ENCOUNTER — Ambulatory Visit: Payer: BC Managed Care – PPO | Admitting: Internal Medicine

## 2020-03-21 ENCOUNTER — Encounter: Payer: Self-pay | Admitting: Internal Medicine

## 2020-03-21 VITALS — BP 116/60 | HR 60 | Ht 71.0 in | Wt 221.0 lb

## 2020-03-21 DIAGNOSIS — E7849 Other hyperlipidemia: Secondary | ICD-10-CM | POA: Diagnosis not present

## 2020-03-21 DIAGNOSIS — I480 Paroxysmal atrial fibrillation: Secondary | ICD-10-CM

## 2020-03-21 DIAGNOSIS — R072 Precordial pain: Secondary | ICD-10-CM

## 2020-03-21 MED ORDER — METOPROLOL TARTRATE 50 MG PO TABS: ORAL_TABLET | ORAL | 0 refills | Status: DC

## 2020-03-21 MED ORDER — ASPIRIN EC 81 MG PO TBEC: 81.0000 mg | DELAYED_RELEASE_TABLET | Freq: Every day | ORAL | 3 refills | Status: DC

## 2020-03-21 NOTE — Patient Instructions (Addendum)
Medication Instructions:  Your physician has recommended you make the following change in your medication:  START: Aspirin 81 mg by mouth daily  *If you need a refill on your cardiac medications before your next appointment, please call your pharmacy*   Lab Work: NONE If you have labs (blood work) drawn today and your tests are completely normal, you will receive your results only by: Marland Kitchen MyChart Message (if you have MyChart) OR . A paper copy in the mail If you have any lab test that is abnormal or we need to change your treatment, we will call you to review the results.   Testing/Procedures: Your physician has requested that you have a Cardiac CT.    Follow-Up: At [facility], you and your health needs are our priority.  As part of our continuing mission to provide you with exceptional heart care, we have created designated Provider Care Teams.  These Care Teams include your primary Cardiologist (physician) and Advanced Practice Providers (APPs -  Physician Assistants and Nurse Practitioners) who all work together to provide you with the care you need, when you need it.  We recommend signing up for the patient portal called "MyChart".  Sign up information is provided on this After Visit Summary.  MyChart is used to connect with patients for Virtual Visits (Telemedicine).  Patients are able to view lab/test results, encounter notes, upcoming appointments, etc.  Non-urgent messages can be sent to your provider as well.   To learn more about what you can do with MyChart, go to [email or web address].    Your next appointment:   5-6 month(s)  The format for your next appointment:   In Person  Provider:   You may see Werner Lean, MD or one of the following Advanced Practice Providers on your designated Care Team:    Melina Copa, PA-C  Ermalinda Barrios, PA-C    Other Instructions  Your cardiac CT will be scheduled at one of the below locations:   [facility] [address] [place], [place] [address] [phone]  [place] [address] [place], [place] [address] [phone]  If scheduled at [facility], please arrive at the [facility] main entrance (entrance A) of [facility] 30 minutes prior to test start time. Proceed to the [facility] Radiology Department (first floor) to check-in and test prep.  If scheduled at [facility], please arrive 15 mins early for check-in and test prep.  Please follow these instructions carefully (unless otherwise directed):  Hold all erectile dysfunction medications at least 3 days (72 hrs) prior to test.  On the Night Before the Test: . Be sure to Drink plenty of water. . Do not consume any caffeinated/decaffeinated beverages or chocolate 12 hours prior to your test. . Do not take any antihistamines 12 hours prior to your test.   On the Day of the Test: . Drink plenty of water until 1 hour prior to the test. . Do not eat any food 4 hours prior to the test. . You may take your regular medications prior to the test.  . Take metoprolol (Lopressor) 50mg   two hours prior to test.        After the Test: . Drink plenty of water. . After receiving IV contrast, you may experience a mild flushed feeling. This is normal. . On occasion, you may experience a mild rash up to 24 hours after the test. This is  not dangerous. If this occurs, you can take Benadryl 25 mg and increase your fluid intake. . If you experience trouble breathing, this can be serious. If it is severe call 911 IMMEDIATELY. If it is mild, please call our office. . If you take any of these medications: Glipizide/Metformin, Avandament, Glucavance, please do not take 48 hours after completing test unless otherwise instructed.   Once we have confirmed authorization from your insurance company, we will call you to set up a date and time  for your test. Based on how quickly your insurance processes prior authorizations requests, please allow up to 4 weeks to be contacted for scheduling your Cardiac CT appointment. Be advised that routine Cardiac CT appointments could be scheduled as many as 8 weeks after your provider has ordered it.  For non-scheduling related questions, please contact the cardiac imaging nurse navigator should you have any questions/concerns: Marchia Bond, Cardiac Imaging Nurse Navigator Gordy Clement, Cardiac Imaging Nurse Navigator [facility] Direct Office Dial: [phone]   For scheduling needs, including cancellations and rescheduling, please call [place], [phone].

## 2020-03-21 NOTE — Progress Notes (Signed)
Cardiology Office Note:    Date:  03/21/2020   ID:  Phillip Mcguire, DOB [birth year], MRN [id number]  PCP:  Kathyrn Drown, MD   [place]  Cardiologist:  Werner Lean, MD  Advanced Practice Provider:  No care team member to display Electrophysiologist:  None       CC: SOB and fatigue Consulted for the evaluation of Borderline AA at the behest of Wolfgang Phoenix, Elayne Snare, MD  History of Present Illness:    Phillip Mcguire is a 65 y.o. male with a hx of OSA, Atrial Fibrillation, Thoracic Aortic Aneurysm who presents for evaluation 03/21/20.  Patient notes that he is feeling shortness of breath and fatigue.  Has noted some chest discomfort that goes across his chest.  Feels like something is in his blood and just passes through.  Radiates to his arm but not down his arm.  He noticesit more at night that during the day.  Discomfort occurs with at rest, worsens with no clear triggers, and improves with time only (briefly occurs).  Does not some events with climbing the steps and DOE; other times has no issues.  Patient exertion notable for  Walking and feels no symptoms.  Notes shortness of breath at rest.  No PND or orthopnea.  Notes bendopnea, denies weight gain, no leg swelling , or abdominal swelling.  No syncope or near syncope .Just gets really tired.  Notes that when he has the atrial fibrillation he felt like a near black out but hasn't had this since.   Past Medical History:  Diagnosis Date  . Abnormal LFTs    mild elevation of total, indirect and direct bilirubin  . Allergy   . Congenital absence of kidney    Right  . GERD (gastroesophageal reflux disease)   . Hearing loss   . Sleep apnea     Past Surgical History:  Procedure Laterality Date  . COLONOSCOPY  2007   [place]: normal  . COLONOSCOPY N/A 12/26/2015   Procedure: COLONOSCOPY;  Surgeon: Daneil Dolin, MD;  [facility];  Service: Endoscopy;  Laterality: N/A;  2:00 PM  .  ESOPHAGOGASTRODUODENOSCOPY  01/2002   Dr. Deatra Ina: Normal  . NAILBED REPAIR Right 06/14/2018   Procedure: RECONSTRUCTION NAILBED WITH PALMARIS GRAFT RIGHT THUMB;  Surgeon: Daryll Brod, MD;  Location: [facility] [place];  Service: Orthopedics;  Laterality: Right;  AXILLARY BLOCK  . NASAL SEPTUM SURGERY  1980's  . otosclerosis     bilateral ear surgery  . SHOULDER ARTHROSCOPY W/ ROTATOR CUFF REPAIR Left 2003  . titanium implant Left March 2010   left middle ear    Current Medications: Current Meds  Medication Sig  . albuterol (VENTOLIN HFA) 108 (90 Base) MCG/ACT inhaler Inhale 2 puffs into lungs QID prn for wheezing/cough  . aspirin EC 81 MG tablet Take 1 tablet (81 mg total) by mouth daily. Swallow whole.  Marland Kitchen atorvastatin (LIPITOR) 10 MG tablet TAKE 1 TABLET BY MOUTH EVERY DAY  . CALCIUM-VITAMIN D PO Take 1 tablet by mouth daily. 200 units calcium, 1100units vit D  . Cholecalciferol (VITAMIN D) 2000 units CAPS Take 2,000 Units by mouth daily.  . ciprofloxacin-dexamethasone (CIPRODEX) OTIC suspension   . clotrimazole-betamethasone (LOTRISONE) lotion Apply topically.  Marland Kitchen diltiazem (CARDIZEM) 30 MG tablet Take 1 tablet every 4 hours AS NEEDED for AFIB heart rate >100  . doxycycline (VIBRAMYCIN) 100 MG capsule Take 100 mg by mouth 2 (two) times daily.  . famotidine (  PEPCID) 40 MG tablet Take 40 mg by mouth daily.  . fluconazole (DIFLUCAN) 100 MG tablet Take by mouth daily.  . fluticasone (FLONASE) 50 MCG/ACT nasal spray SPRAY 2 SPRAYS INTO EACH NOSTRIL EVERY DAY  . gabapentin (NEURONTIN) 100 MG capsule TAKE 1 CAPSULE BY MOUTH EVERY MORNING AND 2 CAPSULES EVERY EVENING AND 3 CAPSULES AT BEDITME  . loratadine (CLARITIN) 10 MG tablet Take 10 mg by mouth daily.  . Magnesium Oxide 200 MG TABS Take 200 mg by mouth daily.  . metoprolol tartrate (LOPRESSOR) 50 MG tablet Take 2 hours prior to Cardiac CT  . Multiple Vitamin (MULTIVITAMIN) capsule Take 1 capsule by mouth daily.  Marland Kitchen omeprazole  (PRILOSEC) 20 MG capsule Take 20 mg by mouth daily.   Marland Kitchen OVER THE COUNTER MEDICATION Take 1 tablet by mouth daily. Zinc  . Probiotic Product (PROBIOTIC PO) Take 1 Dose by mouth daily. 300mg   . vitamin C (ASCORBIC ACID) 500 MG tablet Take 500 mg by mouth daily.   Current Facility-Administered Medications for the 03/21/20 encounter (Office Visit) with Werner Lean, MD  Medication  . triamcinolone acetonide (KENALOG-40) injection 20 mg     Allergies:   Latex   Social History   Socioeconomic History  . Marital status: Widowed    Spouse name: [name]  . Number of children: 1  . Years of education: 36  . Highest education level: Not on file  Occupational History  . 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    Comment: [employer]  Tobacco Use  . Smoking status: Former Smoker    Packs/day: 1.50    Years: 15.00    Pack years: 22.50    Types: Cigarettes    Quit date: 01/13/1988    Years since quitting: 32.2  . Smokeless tobacco: Former Network engineer  . Vaping Use: Never used  Substance and Sexual Activity  . Alcohol use: Yes    Alcohol/week: 0.0 standard drinks    Comment: rarely drink beer  . Drug use: No  . Sexual activity: Yes    Partners: Female  Other Topics Concern  . Not on file  Social History Narrative   He works in [employer], Energy manager   Lives with wife.     Caffeine use: 2 cups coffee per day   Social Determinants of Health   Financial Resource Strain: Not on file  Food Insecurity: Not on file  Transportation Needs: Not on file  Physical Activity: Not on file  Stress: Not on file  Social Connections: Not on file     Family History: The patient's family history includes Allergies in his son; Cancer in his maternal aunt, maternal uncle, paternal aunt, and paternal uncle; Diabetes in his father, mother, and sister; Heart disease in his father and mother; Hyperlipidemia in his father and mother; Hypertension in his mother; Lung cancer in his  father. There is no history of Neuropathy. History of coronary artery disease notable for father. History of heart failure notable for no members. History of arrhythmia notable for mother (needed defibrillator NOS). Denies family history of sudden cardiac death including drowning, car accidents, or unexplained deaths in the family. No history of bicuspid aortic valve or aortic aneurysm or dissection.   Grandmother had some type of aneurysm (may have been brain).   ROS:   Please see the history of present illness.    Notes painless occasional floaters in the eyes.  All other systems reviewed and are negative.  EKGs/Labs/Other Studies Reviewed:  The following studies were reviewed today:  EKG:   03/21/20: SR rate 60 WNL  Transthoracic Echocardiogram: Date: 12/27/2019 Results: ARHI 2.05 cm/m 1. Left ventricular ejection fraction, by estimation, is 50 to 55%. The  left ventricle has low normal function. The left ventricle has no regional  wall motion abnormalities. The left ventricular internal cavity size was  mildly dilated. Left ventricular  diastolic parameters are consistent with Grade I diastolic dysfunction  (impaired relaxation). The average left ventricular global longitudinal  strain is -18.3 %. The global longitudinal strain is normal.  2. Right ventricular systolic function is normal. The right ventricular  size is normal.  3. The mitral valve is normal in structure. Trivial mitral valve  regurgitation. No evidence of mitral stenosis.  4. The aortic valve is tricuspid. Aortic valve regurgitation is not  visualized. No aortic stenosis is present.  5. Aortic dilatation noted. There is mild dilatation at the level of the  sinuses of Valsalva, measuring 37 mm.  6. The inferior vena cava is normal in size with greater than 50%  respiratory variability, suggesting right atrial pressure of 3 mmHg.   ECG Stress Test: Date: 10/31/2015 Results:   Blood pressure  demonstrated a normal response to exercise.  There was no ST segment deviation noted during stress.  Low risk Duke treadmill score portends a favorable prognosis with respect to cardiac events.   Recent Labs: 06/05/2019: ALT 26 12/15/2019: Hemoglobin 15.7; Platelets 245; TSH 1.217 01/02/2020: BUN 15; Creatinine, Ser 0.95; Magnesium 1.7; Potassium 4.2; Sodium 140  Recent Lipid Panel    Component Value Date/Time   CHOL 146 09/05/2019 0853   TRIG 68 09/05/2019 0853   HDL 54 09/05/2019 0853   CHOLHDL 2.7 09/05/2019 0853   CHOLHDL 4.1 07/24/2013 0804   VLDL 25 07/24/2013 0804   LDLCALC 78 09/05/2019 0853    Risk Assessment/Calculations:    CHA2DS2-VASc Score = 0  This indicates a 0.2% annual risk of stroke. The patient's score is based upon: CHF History: No HTN History: No Diabetes History: No Stroke History: No Vascular Disease History: No Age Score: 0 Gender Score: 0       Physical Exam:    VS:  BP 116/60   Pulse 60   Ht 5\' 11"  (1.803 m)   Wt 221 lb (100.2 kg)   SpO2 96%   BMI 30.82 kg/m     Wt Readings from Last 3 Encounters:  03/21/20 221 lb (100.2 kg)  03/06/20 220 lb 6.4 oz (100 kg)  01/10/20 218 lb 12.8 oz (99.2 kg)     GEN:  Well nourished, well developed in no acute distress HEENT: Normal NECK: No JVD; No carotid bruits LYMPHATICS: No lymphadenopathy CARDIAC: RRR, no murmurs, rubs, gallops RESPIRATORY:  Clear to auscultation without rales, wheezing or rhonchi  ABDOMEN: Soft, non-tender, non-distended MUSCULOSKELETAL:  No edema; No deformity  SKIN: Warm and dry NEUROLOGIC:  Alert and oriented x 3 PSYCHIATRIC:  Normal affect   ASSESSMENT:    1. PAF (paroxysmal atrial fibrillation) ([place])   2. Precordial pain   3. Other hyperlipidemia    PLAN:    In order of problems listed above:   DOE concerning for angina equivalent  Upper limit of normal aortic root size - starting ASA 81 mg QD  - Would recommend CCTA with possible FFR as needed to  exclude obstructive CAD and to assess for non-obstructive CAD requiring secondary prevention  Paroxysmal Atrial Fibrillation  - CHADSVASC=0. - discussed alcohol and exercise as preventive  factors - starting ASA - Continue rate control with PRN diltiazem - has seen AF clinic  HLD- continue statin for now  Summer to Fall follow up unless new symptoms or abnormal test results warranting change in plan  Would be reasonable for  Video Visit Follow up Would be reasonable for  APP Follow up         Medication Adjustments/Labs and Tests Ordered: Current medicines are reviewed at length with the patient today.  Concerns regarding medicines are outlined above.  Orders Placed This Encounter  Procedures  . CT CORONARY MORPH W/CTA COR W/SCORE W/CA W/CM &/OR WO/CM  . CT CORONARY FRACTIONAL FLOW RESERVE DATA PREP  . CT CORONARY FRACTIONAL FLOW RESERVE FLUID ANALYSIS  . Basic metabolic panel  . EKG 12-Lead   Meds ordered this encounter  Medications  . aspirin EC 81 MG tablet    Sig: Take 1 tablet (81 mg total) by mouth daily. Swallow whole.    Dispense:  90 tablet    Refill:  3  . metoprolol tartrate (LOPRESSOR) 50 MG tablet    Sig: Take 2 hours prior to Cardiac CT    Dispense:  1 tablet    Refill:  0    Patient Instructions  Medication Instructions:  Your physician has recommended you make the following change in your medication:  START: Aspirin 81 mg by mouth daily  *If you need a refill on your cardiac medications before your next appointment, please call your pharmacy*   Lab Work: NONE If you have labs (blood work) drawn today and your tests are completely normal, you will receive your results only by: Marland Kitchen MyChart Message (if you have MyChart) OR . A paper copy in the mail If you have any lab test that is abnormal or we need to change your treatment, we will call you to review the results.   Testing/Procedures: Your physician has requested that you have a Cardiac CT.     Follow-Up: At [facility], you and your health needs are our priority.  As part of our continuing mission to provide you with exceptional heart care, we have created designated Provider Care Teams.  These Care Teams include your primary Cardiologist (physician) and Advanced Practice Providers (APPs -  Physician Assistants and Nurse Practitioners) who all work together to provide you with the care you need, when you need it.  We recommend signing up for the patient portal called "MyChart".  Sign up information is provided on this After Visit Summary.  MyChart is used to connect with patients for Virtual Visits (Telemedicine).  Patients are able to view lab/test results, encounter notes, upcoming appointments, etc.  Non-urgent messages can be sent to your provider as well.   To learn more about what you can do with MyChart, go to [email or web address].    Your next appointment:   5-6 month(s)  The format for your next appointment:   In Person  Provider:   You may see Werner Lean, MD or one of the following Advanced Practice Providers on your designated Care Team:    Melina Copa, PA-C  Ermalinda Barrios, PA-C    Other Instructions  Your cardiac CT will be scheduled at one of the below locations:   [facility] [address] [place], [place] [address] [phone]  [place] [address] [place], [place] [address] [phone]  If scheduled at [facility], please arrive at the [facility] main entrance (  entrance A) of [facility] 30 minutes prior to test start time. Proceed to the [place] Day Radiology Department (first floor) to check-in and test prep.  If scheduled at [facility], please arrive 15 mins early for check-in and test prep.  Please follow these instructions carefully (unless otherwise directed):  Hold all erectile dysfunction medications at  least 3 days (72 hrs) prior to test.  On the Night Before the Test: . Be sure to Drink plenty of water. . Do not consume any caffeinated/decaffeinated beverages or chocolate 12 hours prior to your test. . Do not take any antihistamines 12 hours prior to your test.   On the Day of the Test: . Drink plenty of water until 1 hour prior to the test. . Do not eat any food 4 hours prior to the test. . You may take your regular medications prior to the test.  . Take metoprolol (Lopressor) 50mg   two hours prior to test.        After the Test: . Drink plenty of water. . After receiving IV contrast, you may experience a mild flushed feeling. This is normal. . On occasion, you may experience a mild rash up to 24 hours after the test. This is not dangerous. If this occurs, you can take Benadryl 25 mg and increase your fluid intake. . If you experience trouble breathing, this can be serious. If it is severe call 911 IMMEDIATELY. If it is mild, please call our office. . If you take any of these medications: Glipizide/Metformin, Avandament, Glucavance, please do not take 48 hours after completing test unless otherwise instructed.   Once we have confirmed authorization from your insurance company, we will call you to set up a date and time for your test. Based on how quickly your insurance processes prior authorizations requests, please allow up to 4 weeks to be contacted for scheduling your Cardiac CT appointment. Be advised that routine Cardiac CT appointments could be scheduled as many as 8 weeks after your provider has ordered it.  For non-scheduling related questions, please contact the cardiac imaging nurse navigator should you have any questions/concerns: Marchia Bond, Cardiac Imaging Nurse Navigator Gordy Clement, Cardiac Imaging Nurse Navigator  [facility] Services Direct Office Dial: [phone]   For scheduling needs, including cancellations and rescheduling, please call  [place], [phone].        Signed, Werner Lean, MD  03/21/2020 3:19 PM    [place]

## 2020-03-22 MED ORDER — ASPIRIN EC 81 MG PO TBEC: 81.0000 mg | DELAYED_RELEASE_TABLET | Freq: Every day | ORAL | 3 refills | Status: DC

## 2020-03-22 MED ORDER — METOPROLOL TARTRATE 50 MG PO TABS: ORAL_TABLET | ORAL | 0 refills | Status: DC

## 2020-03-22 NOTE — Addendum Note (Signed)
Addended by: Precious Gilding on: 03/22/2020 08:56 AM   Modules accepted: Orders

## 2020-03-22 NOTE — Addendum Note (Signed)
Addended by: Precious Gilding on: 03/22/2020 08:51 AM   Modules accepted: Orders

## 2020-03-22 NOTE — Addendum Note (Signed)
Addended by: Precious Gilding on: 03/22/2020 08:55 AM   Modules accepted: Orders

## 2020-03-29 ENCOUNTER — Other Ambulatory Visit: Payer: Self-pay

## 2020-03-29 LAB — COMPREHENSIVE METABOLIC PANEL
ALT: 25 IU/L (ref 0–44)
AST: 21 IU/L (ref 0–40)
Albumin/Globulin Ratio: 1.9 (ref 1.2–2.2)
Albumin: 4.4 g/dL (ref 3.8–4.8)
Alkaline Phosphatase: 98 IU/L (ref 44–121)
BUN/Creatinine Ratio: 12 (ref 10–24)
BUN: 15 mg/dL (ref 8–27)
Bilirubin Total: 1.3 mg/dL — ABNORMAL HIGH (ref 0.0–1.2)
CO2: 26 mmol/L (ref 20–29)
Calcium: 9.5 mg/dL (ref 8.6–10.2)
Chloride: 100 mmol/L (ref 96–106)
Creatinine, Ser: 1.3 mg/dL — ABNORMAL HIGH (ref 0.76–1.27)
Globulin, Total: 2.3 g/dL (ref 1.5–4.5)
Glucose: 94 mg/dL (ref 65–99)
Potassium: 4.7 mmol/L (ref 3.5–5.2)
Sodium: 141 mmol/L (ref 134–144)
Total Protein: 6.7 g/dL (ref 6.0–8.5)
eGFR: 61 mL/min/{1.73_m2} (ref >59)

## 2020-03-29 LAB — LIPID PANEL
Chol/HDL Ratio: 3.2 ratio (ref 0.0–5.0)
Cholesterol, Total: 147 mg/dL (ref 100–199)
HDL: 46 mg/dL (ref >39)
LDL Chol Calc (NIH): 83 mg/dL (ref 0–99)
Triglycerides: 96 mg/dL (ref 0–149)
VLDL Cholesterol Cal: 18 mg/dL (ref 5–40)

## 2020-03-29 MED ORDER — ATORVASTATIN CALCIUM 20 MG PO TABS: ORAL_TABLET | ORAL | 1 refills | Status: DC

## 2020-04-10 ENCOUNTER — Ambulatory Visit (HOSPITAL_COMMUNITY)
Admission: RE | Admit: 2020-04-10 | Discharge: 2020-04-10 | Disposition: A | Discharge status: Home / Self Care | Payer: BC Managed Care – PPO | Source: Ambulatory Visit | Attending: Internal Medicine | Admitting: Internal Medicine

## 2020-04-10 ENCOUNTER — Other Ambulatory Visit: Payer: Self-pay

## 2020-04-10 ENCOUNTER — Ambulatory Visit (HOSPITAL_COMMUNITY): Payer: BC Managed Care – PPO

## 2020-04-10 DIAGNOSIS — R072 Precordial pain: Secondary | ICD-10-CM | POA: Insufficient documentation

## 2020-04-10 MED ORDER — NITROGLYCERIN 0.4 MG SL SUBL
SUBLINGUAL_TABLET | SUBLINGUAL | Status: AC
  Filled 2020-04-10: qty 2

## 2020-04-10 MED ORDER — IOHEXOL 350 MG/ML SOLN
80.0000 mL | Freq: Once | INTRAVENOUS | Status: AC | PRN
  Administered 2020-04-10: 80 mL via INTRAVENOUS

## 2020-04-10 MED ORDER — NITROGLYCERIN 0.4 MG SL SUBL
0.8000 mg | SUBLINGUAL_TABLET | Freq: Once | SUBLINGUAL | Status: AC
  Administered 2020-04-10: 0.8 mg via SUBLINGUAL

## 2020-04-16 ENCOUNTER — Ambulatory Visit: Payer: BC Managed Care – PPO | Admitting: Cardiology

## 2020-04-17 ENCOUNTER — Encounter: Payer: Self-pay | Admitting: Family Medicine

## 2020-04-17 ENCOUNTER — Other Ambulatory Visit: Payer: Self-pay

## 2020-04-17 ENCOUNTER — Ambulatory Visit (INDEPENDENT_AMBULATORY_CARE_PROVIDER_SITE_OTHER): Payer: BC Managed Care – PPO | Admitting: Family Medicine

## 2020-04-17 VITALS — BP 128/78 | Temp 97.0°F | Wt 218.0 lb

## 2020-04-17 DIAGNOSIS — R0600 Dyspnea, unspecified: Secondary | ICD-10-CM | POA: Diagnosis not present

## 2020-04-17 DIAGNOSIS — E7849 Other hyperlipidemia: Secondary | ICD-10-CM | POA: Diagnosis not present

## 2020-04-17 DIAGNOSIS — I2510 Atherosclerotic heart disease of native coronary artery without angina pectoris: Secondary | ICD-10-CM | POA: Diagnosis not present

## 2020-04-17 DIAGNOSIS — I700 Atherosclerosis of aorta: Secondary | ICD-10-CM | POA: Diagnosis not present

## 2020-04-17 DIAGNOSIS — R0609 Other forms of dyspnea: Secondary | ICD-10-CM

## 2020-04-17 NOTE — Progress Notes (Signed)
   Subjective:    Patient ID: Phillip Mcguire, male    DOB: [birth year], 65 y.o.   MRN: [id number]  HPI Pt had CT scan on 04/10/2020 and is here to discuss results.  In-depth discussion held with the patient regarding his coronary CT scanning Saw cardiology they did the test apparently when he got called by cardiology they recommended a lipid profile later in the fall but did not necessarily tell him the result of his test so therefore the patient called here to set up an appointment to discuss  We did discuss the coronary testing as well as the rest of the CAT scan We did discuss the importance of keeping risk factors under good control healthy diet regular activity He still has shortness of breath with activity although has not had any greater decline with this  Review of Systems Please see above    Objective:   Physical Exam  Lungs clear heart regular pulse normal BP good 25 minutes spent with patient discussing the results of the tests and coming up with approach to minimize risk of future buildup     Assessment & Plan:  1. Other hyperlipidemia Very important to get cholesterol LDL below 70.  Recently we increased his atorvastatin 20 mg he will check lipid profile we will let him know what this shows he will do this lab work in approximately 3 months follow-up in 6 months - Lipid Profile  2. DOE (dyspnea on exertion) Referral to pulmonary for further evaluation of his shortness of breath with activity more than likely this is a combination of deconditioning and possibly some underlying lung issues I do not find any issues with the heart causing this  3. Coronary artery disease involving native heart without angina pectoris, unspecified vessel or lesion type Very small build up noted not rate limiting.  Risk factor treatment and approach the best measures at this point - Ambulatory referral to Pulmonology  4. Thoracic aorta atherosclerosis ([place]) Thoracic aortic atherosclerosis best  treated with aggressive cholesterol measures to get LDL below 70 healthy diet regular activity recommended Continue 81 mg aspirin

## 2020-05-23 ENCOUNTER — Other Ambulatory Visit: Payer: Self-pay | Admitting: Family Medicine

## 2020-05-24 ENCOUNTER — Institutional Professional Consult (permissible substitution): Payer: BC Managed Care – PPO | Admitting: Pulmonary Disease

## 2020-06-25 ENCOUNTER — Encounter: Payer: Self-pay | Admitting: Pulmonary Disease

## 2020-06-25 ENCOUNTER — Ambulatory Visit: Payer: BC Managed Care – PPO | Admitting: Pulmonary Disease

## 2020-06-25 ENCOUNTER — Other Ambulatory Visit: Payer: Self-pay

## 2020-06-25 VITALS — BP 118/64 | HR 59 | Temp 97.0°F | Ht 71.0 in | Wt 217.0 lb

## 2020-06-25 DIAGNOSIS — G4733 Obstructive sleep apnea (adult) (pediatric): Secondary | ICD-10-CM

## 2020-06-25 DIAGNOSIS — R0600 Dyspnea, unspecified: Secondary | ICD-10-CM

## 2020-06-25 DIAGNOSIS — R0609 Other forms of dyspnea: Secondary | ICD-10-CM

## 2020-06-25 DIAGNOSIS — F5112 Insufficient sleep syndrome: Secondary | ICD-10-CM

## 2020-06-25 MED ORDER — ALBUTEROL SULFATE HFA 108 (90 BASE) MCG/ACT IN AERS: 2.0000 | INHALATION_SPRAY | Freq: Four times a day (QID) | RESPIRATORY_TRACT | 3 refills | Status: DC | PRN

## 2020-06-25 NOTE — Progress Notes (Signed)
[facility]  Chief Complaint  Patient presents with   Consult    Sleep consult- currently using CPAP     Constitutional:  BP 118/64 (BP Location: Left Arm, Cuff Size: Normal)   Pulse (!) 59   Temp (!) 97 F (36.1 C) (Temporal)   Ht 5\' 11"  (1.803 m)   Wt 217 lb (98.4 kg)   SpO2 99% Comment: Room air  BMI 30.27 kg/m   Past Medical History:  Allergies, Absent Rt kidney, GERD, Chronic otitis externa Lt ear, Neuropathy, HLD, A fib  Past Surgical History:  He  has a past surgical history that includes otosclerosis; Nasal septum surgery (1980's); Esophagogastroduodenoscopy (01/2002); Colonoscopy (2007); titanium implant (Left, March 2010); Shoulder arthroscopy w/ rotator cuff repair (Left, 2003); Colonoscopy (N/A, 12/26/2015); and Nailbed repair (Right, 06/14/2018).  Brief Summary:  Phillip Mcguire is a 65 y.o. male former smoker with obstructive sleep apnea and dyspnea.       Subjective:   He was previously seen by Dr. Gwenette Greet.  He had home sleep study in 2012 that showed very mild sleep apnea.  He then had sleep study in February 2017 that was negative.  He has been using CPAP.  Download from 2021 showed he is on minimal setting of CPAP 4 cm H2O.  Has full face mask.  No issues with mask fit.  He goes to bed at 10 pm.  Falls asleep quick.  He is a restless sleeper.  Uses the bathroom sometimes at night.  Gets up at 330 am.  Naps during lunch at work for about 15 minutes.  Feels better on days he can sleep in.  Drinks coffee.  Doesn't use anything to help sleep.  Epworth score is 8 out of 24.  He smoked up to 2 packs per day.  He has used an inhaler before and this helps.  Gets episodes of feeling short of breath with walking.  Sometimes gets a cough and wheeze.  No history of pneumonia or TB.  Has chronic sinus and ear infections and followed by ENT at [facility].  Physical Exam:   Appearance - well kempt   ENMT - no sinus tenderness, no oral  exudate, no LAN, Mallampati 3 airway, no stridor  Respiratory - equal breath sounds bilaterally, no wheezing or rales  CV - s1s2 regular rate and rhythm, no murmurs  Ext - no clubbing, no edema  Skin - no rashes  Psych - normal mood and affect   Pulmonary testing:    Chest Imaging:  Cardiac CT 04/10/20 >> calcium score 0, atherosclerosis  Sleep Tests:  HST 07/23/10 >> AHI 5, SpO2 low 80% CPAP 03/10/19 to 06/07/19 >> used on 84 of 90 nights with average 5 hrs 18 min.  Average AHI 0.2. 0.2 with CPAP 4 cm H2O  Cardiac Tests:  Echo 12/27/19 >> EF 50 to 55%, grade 1 DD  Social History:  He  reports that he quit smoking about 32 years ago. His smoking use included cigarettes. He has a 22.50 pack-year smoking history. He has quit using smokeless tobacco. He reports current alcohol use. He reports that he does not use drugs.  Family History:  His family history includes Allergies in his son; Cancer in his maternal aunt, maternal uncle, paternal aunt, and paternal uncle; Diabetes in his father, mother, and sister; Heart disease in his father and mother; Hyperlipidemia in his father and mother; Hypertension in his mother; Lung cancer in his father.  Assessment/Plan:   Daytime sleepiness with history of obstructive sleep apnea. - he previous sleep study showed very borderline and previously download showed he is on lowest level of CPAP - I think his current sleep issues are more likely related to insufficient sleep - will get copy of his CPAP download after he calls with information for his DME - discussed strategies to get at least 7 to 8 hours sleep per day as able  Dyspnea on exertion with history of tobacco abuse. - will arrange for PFT to assess for obstructive lung disease - prn albuterol for now  Paroxysmal atrial fibrillation. - followed by Dr. Rudean Haskell with [facility]  Time Spent Involved in Patient Care on Day of Examination:  47 minutes  Follow up:    Patient Instructions  Try to get 7 to 8 hours of sleep per night  Call with the name of your CPAP supply company and we will then contact them to get a copy of your CPAP report  Will arrange for pulmonary function test  Albuterol two puffs every 6 hours as needed for cough, wheeze, shortness of breath or chest congestion  Follow up in 7 to 8 weeks  Medication List:   Allergies as of 06/25/2020       Reactions   Latex Rash        Medication List        Accurate as of June 25, 2020 10:18 AM. If you have any questions, ask your nurse or doctor.          STOP taking these medications    famotidine 40 MG tablet Commonly known as: PEPCID Stopped by: Chesley Mires, MD       TAKE these medications    albuterol 108 (90 Base) MCG/ACT inhaler Commonly known as: VENTOLIN HFA Inhale 2 puffs into the lungs every 6 (six) hours as needed for wheezing or shortness of breath. Inhale 2 puffs into lungs QID prn for wheezing/cough What changed:  how much to take how to take this when to take this reasons to take this Changed by: Chesley Mires, MD   aspirin EC 81 MG tablet Take 1 tablet (81 mg total) by mouth daily. Swallow whole.   atorvastatin 20 MG tablet Commonly known as: LIPITOR Take one tablet po daily   CALCIUM-VITAMIN D PO Take 1 tablet by mouth daily. 200 units calcium, 1100units vit D   ciprofloxacin-dexamethasone OTIC suspension Commonly known as: CIPRODEX   clotrimazole-betamethasone lotion Commonly known as: LOTRISONE Apply topically.   diltiazem 30 MG tablet Commonly known as: Cardizem Take 1 tablet every 4 hours AS NEEDED for AFIB heart rate >100   doxycycline 100 MG capsule Commonly known as: VIBRAMYCIN Take 100 mg by mouth 2 (two) times daily.   fluconazole 100 MG tablet Commonly known as: DIFLUCAN Take by mouth daily.   fluticasone 50 MCG/ACT nasal spray Commonly known as: FLONASE SPRAY 2 SPRAYS INTO EACH NOSTRIL EVERY DAY   gabapentin  100 MG capsule Commonly known as: NEURONTIN TAKE 1 CAPSULE BY MOUTH EVERY MORNING AND 2 CAPSULES EVERY EVENING AND 3 CAPSULES AT BEDITME   loratadine 10 MG tablet Commonly known as: CLARITIN Take 10 mg by mouth daily.   Magnesium Oxide 200 MG Tabs Take 200 mg by mouth daily.   metoprolol tartrate 50 MG tablet Commonly known as: LOPRESSOR Take 2 hours prior to Cardiac CT   multivitamin capsule Take 1 capsule by mouth daily.   omeprazole 20 MG capsule Commonly known as:  PRILOSEC Take 20 mg by mouth daily.   OVER THE COUNTER MEDICATION Take 1 tablet by mouth daily. Zinc   PROBIOTIC PO Take 1 Dose by mouth daily. 300mg    vitamin C 500 MG tablet Commonly known as: ASCORBIC ACID Take 500 mg by mouth daily.   Vitamin D 50 MCG (2000 UT) Caps Take 2,000 Units by mouth daily.        Signature:  Chesley Mires, MD [place] Pager - [phone] 06/25/2020, 10:18 AM

## 2020-06-25 NOTE — Patient Instructions (Signed)
Try to get 7 to 8 hours of sleep per night  Call with the name of your CPAP supply company and we will then contact them to get a copy of your CPAP report  Will arrange for pulmonary function test  Albuterol two puffs every 6 hours as needed for cough, wheeze, shortness of breath or chest congestion  Follow up in 7 to 8 weeks

## 2020-06-26 LAB — LIPID PANEL
Chol/HDL Ratio: 2.8 ratio (ref 0.0–5.0)
Cholesterol, Total: 125 mg/dL (ref 100–199)
HDL: 45 mg/dL (ref >39)
LDL Chol Calc (NIH): 60 mg/dL (ref 0–99)
Triglycerides: 109 mg/dL (ref 0–149)
VLDL Cholesterol Cal: 20 mg/dL (ref 5–40)

## 2020-06-28 ENCOUNTER — Other Ambulatory Visit: Payer: Self-pay

## 2020-06-28 ENCOUNTER — Other Ambulatory Visit (HOSPITAL_COMMUNITY)
Admission: RE | Admit: 2020-06-28 | Discharge: 2020-06-28 | Disposition: A | Discharge status: Home / Self Care | Payer: BC Managed Care – PPO | Source: Ambulatory Visit | Attending: Pulmonary Disease | Admitting: Pulmonary Disease

## 2020-06-28 DIAGNOSIS — Z01812 Encounter for preprocedural laboratory examination: Secondary | ICD-10-CM | POA: Diagnosis not present

## 2020-06-28 DIAGNOSIS — Z20822 Contact with and (suspected) exposure to covid-19: Secondary | ICD-10-CM | POA: Insufficient documentation

## 2020-06-29 LAB — SARS CORONAVIRUS 2 (TAT 6-24 HRS): SARS Coronavirus 2: NEGATIVE

## 2020-07-02 ENCOUNTER — Telehealth: Payer: Self-pay | Admitting: Pulmonary Disease

## 2020-07-02 ENCOUNTER — Ambulatory Visit (HOSPITAL_COMMUNITY)
Admission: RE | Admit: 2020-07-02 | Discharge: 2020-07-02 | Disposition: A | Discharge status: Home / Self Care | Payer: BC Managed Care – PPO | Source: Ambulatory Visit | Attending: Pulmonary Disease | Admitting: Pulmonary Disease

## 2020-07-02 DIAGNOSIS — R0600 Dyspnea, unspecified: Secondary | ICD-10-CM | POA: Diagnosis not present

## 2020-07-02 DIAGNOSIS — R0609 Other forms of dyspnea: Secondary | ICD-10-CM

## 2020-07-02 LAB — PULMONARY FUNCTION TEST
DL/VA % pred: 108 %
DL/VA: 4.46 ml/min/mmHg/L
DLCO unc % pred: 103 %
DLCO unc: 29.64 ml/min/mmHg
FEF 25-75 Post: 3.59 L/sec
FEF 25-75 Pre: 3.91 L/sec
FEF2575-%Change-Post: -8 %
FEF2575-%Pred-Post: 121 %
FEF2575-%Pred-Pre: 131 %
FEV1-%Change-Post: 0 %
FEV1-%Pred-Post: 95 %
FEV1-%Pred-Pre: 95 %
FEV1-Post: 3.58 L
FEV1-Pre: 3.58 L
FEV1FVC-%Change-Post: 0 %
FEV1FVC-%Pred-Pre: 109 %
FEV6-%Change-Post: 0 %
FEV6-%Pred-Post: 92 %
FEV6-%Pred-Pre: 92 %
FEV6-Post: 4.4 L
FEV6-Pre: 4.38 L
FEV6FVC-%Change-Post: 0 %
FEV6FVC-%Pred-Post: 104 %
FEV6FVC-%Pred-Pre: 105 %
FVC-%Change-Post: 0 %
FVC-%Pred-Post: 88 %
FVC-%Pred-Pre: 87 %
FVC-Post: 4.4 L
FVC-Pre: 4.38 L
Post FEV1/FVC ratio: 81 %
Post FEV6/FVC ratio: 100 %
Pre FEV1/FVC ratio: 82 %
Pre FEV6/FVC Ratio: 100 %
RV % pred: 96 %
RV: 2.36 L
TLC % pred: 93 %
TLC: 6.96 L

## 2020-07-02 MED ORDER — ALBUTEROL SULFATE (2.5 MG/3ML) 0.083% IN NEBU
2.5000 mg | INHALATION_SOLUTION | Freq: Once | RESPIRATORY_TRACT | Status: AC
  Administered 2020-07-02: 2.5 mg via RESPIRATORY_TRACT

## 2020-07-02 NOTE — Telephone Encounter (Signed)
Download has been faxed to Dr. Halford Chessman for review.  Routing to Dr. Halford Chessman to make aware.

## 2020-07-02 NOTE — Telephone Encounter (Signed)
Spoke to patient, who stated that his DME company is [employer]. Chart has been updated.   Spoke to Olla with [employer], who stated that patient's machine is >65 years old and stopped downloading in Oct.  I have requested download to be faxed to our office.

## 2020-07-10 ENCOUNTER — Encounter: Payer: Self-pay | Admitting: *Deleted

## 2020-07-10 NOTE — Telephone Encounter (Signed)
CPAP 09/23/19 to 10/22/19 >> used on 28 of 30 nights with average 5 hrs 19.  Average AHI 0.2 with CPAP 4 cm H2O.   Please let him know his CPAP report shows good control of sleep apnea.

## 2020-07-11 NOTE — Telephone Encounter (Signed)
Patient is aware of below message and voiced his understanding.  Nothing further needed at this time.   

## 2020-08-12 ENCOUNTER — Ambulatory Visit: Payer: BC Managed Care – PPO | Admitting: Pulmonary Disease

## 2020-08-12 ENCOUNTER — Other Ambulatory Visit: Payer: Self-pay

## 2020-08-12 ENCOUNTER — Encounter: Payer: Self-pay | Admitting: Pulmonary Disease

## 2020-08-12 VITALS — BP 130/80 | HR 60 | Temp 96.3°F | Ht 72.0 in | Wt 216.0 lb

## 2020-08-12 DIAGNOSIS — R0609 Other forms of dyspnea: Secondary | ICD-10-CM

## 2020-08-12 DIAGNOSIS — F5112 Insufficient sleep syndrome: Secondary | ICD-10-CM | POA: Diagnosis not present

## 2020-08-12 DIAGNOSIS — G4733 Obstructive sleep apnea (adult) (pediatric): Secondary | ICD-10-CM

## 2020-08-12 DIAGNOSIS — R0600 Dyspnea, unspecified: Secondary | ICD-10-CM

## 2020-08-12 MED ORDER — AZELASTINE HCL 0.1 % NA SOLN: 1.0000 | Freq: Two times a day (BID) | NASAL | 12 refills | Status: AC

## 2020-08-12 NOTE — Progress Notes (Signed)
Cardiology Office Note:    Date:  08/13/2020   ID:  Phillip Mcguire, DOB 03-24-1955, MRN [id number]  PCP:  Kathyrn Drown, MD   [place]  Cardiologist:  Werner Lean, MD  Advanced Practice Provider:  No care team member to display Electrophysiologist:  None      CC: SOB and fatigue f/u  History of Present Illness:    Phillip Mcguire is a 65 y.o. male with a hx of OSA, Atrial Fibrillation, Borderline Thoracic Aortic Aneurysm who presents for evaluation 03/21/20. Started on ASA since last visit.  Had normal CCTA.  Patient notes that he is doing much better.  Since last visit notes changes to his sinus medication.  Relevant interval testing or therapy include CCTA.  There are no interval hospital/ED visit.    No chest pain or pressure .  No SOB/DOE and no PND/Orthopnea.  No weight gain or leg swelling.  No palpitations or syncope.   Past Medical History:  Diagnosis Date   Abnormal LFTs    mild elevation of total, indirect and direct bilirubin   Allergy    Congenital absence of kidney    Right   GERD (gastroesophageal reflux disease)    Hearing loss    Sleep apnea     Past Surgical History:  Procedure Laterality Date   COLONOSCOPY  2007   [place]k: normal   COLONOSCOPY N/A 12/26/2015   Procedure: COLONOSCOPY;  Surgeon: Daneil Dolin, MD;  [facility];  Service: Endoscopy;  Laterality: N/A;  2:00 PM   ESOPHAGOGASTRODUODENOSCOPY  01/2002   Dr. Deatra Ina: Normal   NAILBED REPAIR Right 06/14/2018   Procedure: RECONSTRUCTION NAILBED WITH PALMARIS GRAFT RIGHT THUMB;  Surgeon: Daryll Brod, MD;  Location: [place];  Service: Orthopedics;  Laterality: Right;  AXILLARY BLOCK   NASAL SEPTUM SURGERY  1980's   otosclerosis     bilateral ear surgery   SHOULDER ARTHROSCOPY W/ ROTATOR CUFF REPAIR Left 2003   titanium implant Left March 2010   left middle ear    Current Medications: Current Meds  Medication Sig   albuterol  (VENTOLIN HFA) 108 (90 Base) MCG/ACT inhaler Inhale 2 puffs into the lungs every 6 (six) hours as needed for wheezing or shortness of breath. Inhale 2 puffs into lungs QID prn for wheezing/cough   aspirin EC 81 MG tablet Take 1 tablet (81 mg total) by mouth daily. Swallow whole.   atorvastatin (LIPITOR) 20 MG tablet Take one tablet po daily   azelastine (ASTELIN) 0.1 % nasal spray Place 1 spray into both nostrils 2 (two) times daily. Use in each nostril as directed   CALCIUM-VITAMIN D PO Take 1 tablet by mouth daily. 200 units calcium, 1100units vit D   Cholecalciferol (VITAMIN D) 2000 units CAPS Take 2,000 Units by mouth daily.   clotrimazole-betamethasone (LOTRISONE) lotion Apply topically.   fluticasone (FLONASE) 50 MCG/ACT nasal spray Place 1 spray into both nostrils daily.   gabapentin (NEURONTIN) 100 MG capsule TAKE 1 CAPSULE BY MOUTH EVERY MORNING AND 2 CAPSULES EVERY EVENING AND 3 CAPSULES AT BEDITME   loratadine (CLARITIN) 10 MG tablet Take 10 mg by mouth daily.   Magnesium Oxide 200 MG TABS Take 200 mg by mouth daily.   Multiple Vitamin (MULTIVITAMIN) capsule Take 1 capsule by mouth daily.   omeprazole (PRILOSEC) 20 MG capsule Take 20 mg by mouth daily.    OVER THE COUNTER MEDICATION Take 1 tablet by mouth daily. Zinc  Probiotic Product (PROBIOTIC PO) Take 1 Dose by mouth daily. '300mg'$    vitamin C (ASCORBIC ACID) 500 MG tablet Take 500 mg by mouth daily.   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    Comment: [employer]  Tobacco Use   Smoking status: Former    Packs/day:  1.50    Years: 15.00    Pack years: 22.50    Types: Cigarettes    Quit date: 01/13/1988    Years since quitting: 32.6   Smokeless tobacco: Former  Scientific laboratory technician Use: Never used  Substance and Sexual Activity   Alcohol use: Yes    Alcohol/week: 0.0 standard drinks    Comment: rarely drink beer   Drug use: No   Sexual activity: Yes    Partners: Female  Other Topics Concern   Not on file  Social History Narrative   He works in Theatre manager, Energy manager   Lives with wife.     Caffeine use: 2 cups coffee per day   Social Determinants of Health   Financial Resource Strain: Not on file  Food Insecurity: Not on file  Transportation Needs: Not on file  Physical Activity: Not on file  Stress: Not on file  Social Connections: Not on file     Family History: The patient's family history includes Allergies in his son; Cancer in his maternal aunt, maternal uncle, paternal aunt, and paternal uncle; Diabetes in his father, mother, and sister; Heart disease in his father and mother; Hyperlipidemia in his father and mother; Hypertension in his mother; Lung cancer in his father. There is no history of Neuropathy. History of coronary artery disease notable for father. History of heart failure notable for no members. History of arrhythmia notable for mother (needed defibrillator NOS). Denies family history of sudden cardiac death including drowning, car accidents, or unexplained deaths in the family. No history of bicuspid aortic valve or aortic aneurysm or dissection.   Grandmother had some type of aneurysm (may have been brain).   ROS:   Please see the history of present illness.    Notes painless occasional floaters in the eyes.  All other systems reviewed and are negative.  EKGs/Labs/Other Studies Reviewed:    The following studies were reviewed today:  EKG:   08/13/20:  SR rate 60 WNL 03/21/20: SR rate 60 WNL  Transthoracic Echocardiogram: Date: 12/27/2019 Results:  ARHI 2.05 cm/m 1. Left ventricular ejection fraction, by estimation, is 50 to 55%. The  left ventricle has low normal function. The left ventricle has no regional  wall motion abnormalities. The left ventricular internal cavity size was  mildly dilated. Left ventricular  diastolic parameters are consistent with Grade I diastolic dysfunction  (impaired relaxation). The average left ventricular global longitudinal  strain is -18.3 %. The global longitudinal strain is normal.   2. Right ventricular systolic function is normal. The right ventricular  size is normal.   3. The mitral valve is normal in structure. Trivial mitral valve  regurgitation. No evidence of mitral stenosis.   4. The aortic valve is  tricuspid. Aortic valve regurgitation is not  visualized. No aortic stenosis is present.   5. Aortic dilatation noted. There is mild dilatation at the level of the  sinuses of Valsalva, measuring 37 mm.   6. The inferior vena cava is normal in size with greater than 50%  respiratory variability, suggesting right atrial pressure of 3 mmHg.   Cardiac CT: Date: 04/10/2020 Results: IMPRESSION: 1. Coronary calcium score of 0.   2. Normal coronary origin with right dominance.   3. Nonobstructive CAD with noncalcified plaque causing minimal (0-24%) stenosis in ostial LAD   CAD-RADS 1. Minimal non-obstructive CAD (0-24%). Consider non-atherosclerotic causes of chest pain. Consider preventive therapy and risk factor modification.   ECG Stress Test: Date: 10/31/2015 Results:  Blood pressure demonstrated a normal response to exercise. There was no ST segment deviation noted during stress. Low risk Duke treadmill score portends a favorable prognosis with respect to cardiac events.   Recent Labs: 12/15/2019: Hemoglobin 15.7; Platelets 245; TSH 1.217 01/02/2020: Magnesium 1.7 03/28/2020: ALT 25; BUN 15; Creatinine, Ser 1.30; Potassium 4.7; Sodium 141  Recent Lipid Panel    Component Value  Date/Time   CHOL 125 06/25/2020 0815   TRIG 109 06/25/2020 0815   HDL 45 06/25/2020 0815   CHOLHDL 2.8 06/25/2020 0815   CHOLHDL 4.1 07/24/2013 0804   VLDL 25 07/24/2013 0804   LDLCALC 60 06/25/2020 0815    Risk Assessment/Calculations:    CHA2DS2-VASc Score = 0  This indicates a 0.2% annual risk of stroke. The patient's score is based upon: CHF History: No HTN History: No Diabetes History: No Stroke History: No Vascular Disease History: No Age Score: 0 Gender Score: 0       Physical Exam:    VS:  BP 120/72   Pulse 60   Ht 6' (1.829 m)   Wt 219 lb (99.3 kg)   SpO2 96%   BMI 29.70 kg/m     Wt Readings from Last 3 Encounters:  08/13/20 219 lb (99.3 kg)  08/12/20 216 lb (98 kg)  06/25/20 217 lb (98.4 kg)     GEN:  Well nourished, well developed in no acute distress HEENT: Normal NECK: No JVD; No carotid bruits LYMPHATICS: No lymphadenopathy CARDIAC: RRR, no murmurs, rubs, gallops RESPIRATORY:  Clear to auscultation without rales, wheezing or rhonchi  ABDOMEN: Soft, non-tender, non-distended MUSCULOSKELETAL:  No edema; No deformity  SKIN: Warm and dry NEUROLOGIC:  Alert and oriented x 3 PSYCHIATRIC:  Normal affect   ASSESSMENT:    1. PAF (paroxysmal atrial fibrillation) (HCC)     PLAN:    In order of problems listed above:  Aortic Atherosclerosis HLD Aorta at ULN - LDL at goal ~ 60 atorvastatin 20 mg PO Daily - continue ASA  Paroxysmal Atrial Fibrillation  - CHADSVASC=0. - on ASA - Continue rate control with PRN diltiazem  One year follow up unless new symptoms or abnormal test results warranting change in plan  Would be reasonable for  Video Visit Follow up  Would be reasonable for  APP Follow up          Medication Adjustments/Labs and Tests Ordered: Current medicines are reviewed at length with the patient today.  Concerns regarding medicines are outlined above.  Orders Placed This Encounter  Procedures   EKG 12-Lead    No  orders of the defined types were placed in this encounter.   Patient Instructions  Medication Instructions:  Your physician recommends that you continue on your current medications as directed.  Please refer to the Current Medication list given to you today.  *If you need a refill on your cardiac medications before your next appointment, please call your pharmacy*   Lab Work: NONE If you have labs (blood work) drawn today and your tests are completely normal, you will receive your results only by: [place] (if you have MyChart) OR A paper copy in the mail If you have any lab test that is abnormal or we need to change your treatment, we will call you to review the results.   Testing/Procedures: NONE   Follow-Up: At [facility], you and your health needs are our priority.  As part of our continuing mission to provide you with exceptional heart care, we have created designated Provider Care Teams.  These Care Teams include your primary Cardiologist (physician) and Advanced Practice Providers (APPs -  Physician Assistants and Nurse Practitioners) who all work together to provide you with the care you need, when you need it.  Your next appointment:   12 month(s)  The format for your next appointment:   In Person  Provider:   You may see Werner Lean, MD or one of the following Advanced Practice Providers on your designated Care Team:   Melina Copa, PA-C Ermalinda Barrios, PA-C     Signed, Werner Lean, MD  08/13/2020 4:55 PM    [place]

## 2020-08-12 NOTE — Progress Notes (Signed)
[facility]  Chief Complaint  Patient presents with   Follow-up    Patient did PFT 07/02/20. Patient has tightness in chest, does not sleep well, feels like it takes him a whole day to wake up.     Constitutional:  BP 130/80   Pulse 60   Temp (!) 96.3 F (35.7 C)   Ht 6' (1.829 m)   Wt 216 lb (98 kg)   BMI 29.29 kg/m   Past Medical History:  Allergies, Absent Rt kidney, GERD, Chronic otitis externa Lt ear, Neuropathy, HLD, A fib  Past Surgical History:  He  has a past surgical history that includes otosclerosis; Nasal septum surgery (1980's); Esophagogastroduodenoscopy (01/2002); Colonoscopy (2007); titanium implant (Left, March 2010); Shoulder arthroscopy w/ rotator cuff repair (Left, 2003); Colonoscopy (N/A, 12/26/2015); and Nailbed repair (Right, 06/14/2018).  Brief Summary:  Phillip Mcguire is a 65 y.o. male former smoker with obstructive sleep apnea and dyspnea.       Subjective:   PFT from June was normal.  CPAP download showed good control with CPAP 4 cm H2O.  He has added an extra hour of sleep and this has helped.  His main issue at present is related to persistent sinus congestion and drainage.  His head feels swimmy for most of the day.  He has been using flonase and claritin.  Followed by ENT at [facility] and has follow up later this month.  Physical Exam:   Appearance - well kempt   ENMT - no sinus tenderness, no oral exudate, no LAN, Mallampati 3 airway, no stridor  Respiratory - equal breath sounds bilaterally, no wheezing or rales  CV - s1s2 regular rate and rhythm, no murmurs  Ext - no clubbing, no edema  Skin - no rashes  Psych - normal mood and affect   Pulmonary testing:  PFT 07/02/20 >> FEV1 3.58 (95%), FEV1% 82, TLC 6.96 (93%), DLCO 103%  Chest Imaging:  Cardiac CT 04/10/20 >> calcium score 0, atherosclerosis  Sleep Tests:  HST 07/23/10 >> AHI 5, SpO2 low 80% CPAP 09/23/19 to 10/22/19 >> used on 28 of 30  nights with average 5 hrs 19.  Average AHI 0.2 with CPAP 4 cm H2O.  Cardiac Tests:  Echo 12/27/19 >> EF 50 to 55%, grade 1 DD  Social History:  He  reports that he quit smoking about 32 years ago. His smoking use included cigarettes. He has a 22.50 pack-year smoking history. He has quit using smokeless tobacco. He reports current alcohol use. He reports that he does not use drugs.  Family History:  His family history includes Allergies in his son; Cancer in his maternal aunt, maternal uncle, paternal aunt, and paternal uncle; Diabetes in his father, mother, and sister; Heart disease in his father and mother; Hyperlipidemia in his father and mother; Hypertension in his mother; Lung cancer in his father.     Assessment/Plan:   Daytime sleepiness with history of obstructive sleep apnea. - he is compliant with CPAP and reports benefit from therapy - he uses [employer] for his DME - continue CPAP 4 cm H2O - encouraged him to allow for adequate hours of sleep  Dyspnea on exertion with history of tobacco abuse. - PFT and CT chest normal - seems to be more related to chronic sinus congestion - will have him add azelastine and nasal irrigation - continue flonase and claritin - followed by ENT at [facility]  Paroxysmal atrial fibrillation. - followed by Dr. Lyda Kalata  Chandrasekhar with [facility]  Time Spent Involved in Patient Care on Day of Examination:  24 minutes  Follow up:   Patient Instructions  Azelastine nasal spray one spray in each nostril in the morning and in the evening Use saline nasal spray once or twice per day Flonase 1 spray in each nostril daily  Follow up in 6 months  Medication List:   Allergies as of 08/12/2020       Reactions   Latex Rash        Medication List        Accurate as of August 12, 2020 10:17 AM. If you have any questions, ask your nurse or doctor.          STOP taking these medications    ciprofloxacin-dexamethasone OTIC  suspension Commonly known as: CIPRODEX Stopped by: Chesley Mires, MD   diltiazem 30 MG tablet Commonly known as: Cardizem Stopped by: Chesley Mires, MD   doxycycline 100 MG capsule Commonly known as: VIBRAMYCIN Stopped by: Chesley Mires, MD   fluconazole 100 MG tablet Commonly known as: DIFLUCAN Stopped by: Chesley Mires, MD   metoprolol tartrate 50 MG tablet Commonly known as: LOPRESSOR Stopped by: Chesley Mires, MD       TAKE these medications    albuterol 108 (90 Base) MCG/ACT inhaler Commonly known as: VENTOLIN HFA Inhale 2 puffs into the lungs every 6 (six) hours as needed for wheezing or shortness of breath. Inhale 2 puffs into lungs QID prn for wheezing/cough   aspirin EC 81 MG tablet Take 1 tablet (81 mg total) by mouth daily. Swallow whole.   atorvastatin 20 MG tablet Commonly known as: LIPITOR Take one tablet po daily   azelastine 0.1 % nasal spray Commonly known as: ASTELIN Place 1 spray into both nostrils 2 (two) times daily. Use in each nostril as directed Started by: Chesley Mires, MD   CALCIUM-VITAMIN D PO Take 1 tablet by mouth daily. 200 units calcium, 1100units vit D   clotrimazole-betamethasone lotion Commonly known as: LOTRISONE Apply topically.   fluticasone 50 MCG/ACT nasal spray Commonly known as: FLONASE Place 1 spray into both nostrils daily. What changed: Another medication with the same name was removed. Continue taking this medication, and follow the directions you see here. Changed by: Chesley Mires, MD   gabapentin 100 MG capsule Commonly known as: NEURONTIN TAKE 1 CAPSULE BY MOUTH EVERY MORNING AND 2 CAPSULES EVERY EVENING AND 3 CAPSULES AT BEDITME   loratadine 10 MG tablet Commonly known as: CLARITIN Take 10 mg by mouth daily.   Magnesium Oxide 200 MG Tabs Take 200 mg by mouth daily.   multivitamin capsule Take 1 capsule by mouth daily.   omeprazole 20 MG capsule Commonly known as: PRILOSEC Take 20 mg by mouth daily.   OVER THE  COUNTER MEDICATION Take 1 tablet by mouth daily. Zinc   PROBIOTIC PO Take 1 Dose by mouth daily. '300mg'$    vitamin C 500 MG tablet Commonly known as: ASCORBIC ACID Take 500 mg by mouth daily.   Vitamin D 50 MCG (2000 UT) Caps Take 2,000 Units by mouth daily.        Signature:  Chesley Mires, MD [place] Pager - [phone] 08/12/2020, 10:17 AM

## 2020-08-12 NOTE — Patient Instructions (Addendum)
Azelastine nasal spray one spray in each nostril in the morning and in the evening Use saline nasal spray once or twice per day Flonase 1 spray in each nostril daily  Follow up in 6 months

## 2020-08-13 ENCOUNTER — Encounter: Payer: Self-pay | Admitting: Internal Medicine

## 2020-08-13 ENCOUNTER — Ambulatory Visit (INDEPENDENT_AMBULATORY_CARE_PROVIDER_SITE_OTHER): Payer: BC Managed Care – PPO | Admitting: Internal Medicine

## 2020-08-13 VITALS — BP 120/72 | HR 60 | Ht 72.0 in | Wt 219.0 lb

## 2020-08-13 DIAGNOSIS — I480 Paroxysmal atrial fibrillation: Secondary | ICD-10-CM

## 2020-08-13 NOTE — Patient Instructions (Addendum)
Medication Instructions:  Your physician recommends that you continue on your current medications as directed. Please refer to the Current Medication list given to you today.  *If you need a refill on your cardiac medications before your next appointment, please call your pharmacy*   Lab Work: NONE If you have labs (blood work) drawn today and your tests are completely normal, you will receive your results only by: [place] (if you have MyChart) OR A paper copy in the mail If you have any lab test that is abnormal or we need to change your treatment, we will call you to review the results.   Testing/Procedures: NONE   Follow-Up: At [facility], you and your health needs are our priority.  As part of our continuing mission to provide you with exceptional heart care, we have created designated Provider Care Teams.  These Care Teams include your primary Cardiologist (physician) and Advanced Practice Providers (APPs -  Physician Assistants and Nurse Practitioners) who all work together to provide you with the care you need, when you need it.  Your next appointment:   12 month(s)  The format for your next appointment:   In Person  Provider:   You may see Werner Lean, MD or one of the following Advanced Practice Providers on your designated Care Team:   Melina Copa, PA-C Ermalinda Barrios, PA-C

## 2020-09-06 ENCOUNTER — Telehealth: Payer: Self-pay | Admitting: Family Medicine

## 2020-09-06 DIAGNOSIS — E7849 Other hyperlipidemia: Secondary | ICD-10-CM

## 2020-09-06 DIAGNOSIS — Z79899 Other long term (current) drug therapy: Secondary | ICD-10-CM

## 2020-09-06 DIAGNOSIS — Z125 Encounter for screening for malignant neoplasm of prostate: Secondary | ICD-10-CM

## 2020-09-06 DIAGNOSIS — E876 Hypokalemia: Secondary | ICD-10-CM

## 2020-09-06 NOTE — Telephone Encounter (Signed)
Lipid, liver, metabolic 7, PSA 

## 2020-09-06 NOTE — Telephone Encounter (Signed)
Lab orders placed; pt doesn't have voicemail set up at this time; unable to leave message

## 2020-09-06 NOTE — Telephone Encounter (Signed)
Patient has physical 9/26 and needing labs

## 2020-09-06 NOTE — Telephone Encounter (Signed)
Last labs completed 06/25/20 Lipid CMP and Lipid completed 03/28/20. Please advise. Thank you

## 2020-09-06 NOTE — Telephone Encounter (Signed)
Patient called back and is aware that the lab order has been placed. He verbalized understanding.

## 2020-09-18 ENCOUNTER — Telehealth: Payer: Self-pay | Admitting: Podiatry

## 2020-09-18 ENCOUNTER — Other Ambulatory Visit: Payer: Self-pay | Admitting: Family Medicine

## 2020-09-18 MED ORDER — GABAPENTIN 100 MG PO CAPS: ORAL_CAPSULE | ORAL | 0 refills | Status: DC

## 2020-09-18 NOTE — Telephone Encounter (Signed)
   1. Which medications need to be refilled? (please list name of each medication and dose if known) gabapentin (NEURONTIN) 100 MG capsule  2. Which pharmacy/location (including street and city if local pharmacy) is medication to be sent to?  [id number] [place], [place] - [address]      3. Do they need a 30 day or 90 day supply? [address]

## 2020-09-18 NOTE — Telephone Encounter (Signed)
30 day supply of gabapentin '100mg'$  sent to [place] as requested

## 2020-10-07 ENCOUNTER — Other Ambulatory Visit: Payer: Self-pay

## 2020-10-07 ENCOUNTER — Ambulatory Visit (INDEPENDENT_AMBULATORY_CARE_PROVIDER_SITE_OTHER): Payer: BC Managed Care – PPO | Admitting: Family Medicine

## 2020-10-07 VITALS — BP 122/72 | HR 88 | Ht 70.5 in | Wt 217.4 lb

## 2020-10-07 DIAGNOSIS — Z0000 Encounter for general adult medical examination without abnormal findings: Secondary | ICD-10-CM | POA: Diagnosis not present

## 2020-10-07 DIAGNOSIS — Z23 Encounter for immunization: Secondary | ICD-10-CM | POA: Diagnosis not present

## 2020-10-07 MED ORDER — ATORVASTATIN CALCIUM 20 MG PO TABS: ORAL_TABLET | ORAL | 1 refills | Status: DC

## 2020-10-07 NOTE — Progress Notes (Signed)
   Subjective:    Patient ID: Phillip Mcguire, male    DOB: December 20, 1955, 65 y.o.   MRN: [id number]  HPI  The patient comes in today for a wellness visit. Patient has underlying hyperlipidemia Also has sleep apnea does not tolerate CPAP machine well He will be connecting with his ENT to see if they can utilize [facility]   A review of their health history was completed.  A review of medications was also completed.  Any needed refills; no  Eating habits: trying to eat healthy  Falls/  MVA accidents in past few months: no  Regular exercise: a lot of walking at work  Specialist pt sees on regular basis: ear doctor  Preventative health issues were discussed.   Additional concerns: none   Review of Systems     Objective:   Physical Exam  General-in no acute distress Eyes-no discharge Lungs-respiratory rate normal, CTA CV-no murmurs,RRR Extremities skin warm dry no edema Neuro grossly normal Behavior normal, alert  Prostate exam normal Patient did lab work this morning await results    Assessment & Plan:  Adult wellness-complete.wellness physical was conducted today. Importance of diet and exercise were discussed in detail.  In addition to this a discussion regarding safety was also covered. We also reviewed over immunizations and gave recommendations regarding current immunization needed for age.  In addition to this additional areas were also touched on including: Preventative health exams needed:  Colonoscopy previous 1 normal 2027  Patient was advised yearly wellness exam Flu shot today COVID booster recommended Shingrix recommended Follow-up in 6 months  Hyperlipidemia taking medication Previous labs look good  Had additional lab work drawn today await results s

## 2020-10-07 NOTE — Patient Instructions (Signed)

## 2020-10-08 LAB — LIPID PANEL
Chol/HDL Ratio: 2.9 ratio (ref 0.0–5.0)
Cholesterol, Total: 162 mg/dL (ref 100–199)
HDL: 56 mg/dL (ref >39)
LDL Chol Calc (NIH): 90 mg/dL (ref 0–99)
Triglycerides: 84 mg/dL (ref 0–149)
VLDL Cholesterol Cal: 16 mg/dL (ref 5–40)

## 2020-10-08 LAB — BASIC METABOLIC PANEL
BUN/Creatinine Ratio: 17 (ref 10–24)
BUN: 18 mg/dL (ref 8–27)
CO2: 26 mmol/L (ref 20–29)
Calcium: 9.9 mg/dL (ref 8.6–10.2)
Chloride: 101 mmol/L (ref 96–106)
Creatinine, Ser: 1.03 mg/dL (ref 0.76–1.27)
Glucose: 92 mg/dL (ref 70–99)
Potassium: 4.6 mmol/L (ref 3.5–5.2)
Sodium: 141 mmol/L (ref 134–144)
eGFR: 81 mL/min/{1.73_m2} (ref >59)

## 2020-10-08 LAB — HEPATIC FUNCTION PANEL
ALT: 24 IU/L (ref 0–44)
AST: 19 IU/L (ref 0–40)
Albumin: 4.7 g/dL (ref 3.8–4.8)
Alkaline Phosphatase: 95 IU/L (ref 44–121)
Bilirubin Total: 1.5 mg/dL — ABNORMAL HIGH (ref 0.0–1.2)
Bilirubin, Direct: 0.34 mg/dL (ref 0.00–0.40)
Total Protein: 7.2 g/dL (ref 6.0–8.5)

## 2020-10-08 LAB — PSA: Prostate Specific Ag, Serum: 2.2 ng/mL (ref 0.0–4.0)

## 2020-11-07 ENCOUNTER — Encounter: Payer: Self-pay | Admitting: Podiatry

## 2020-11-07 ENCOUNTER — Other Ambulatory Visit: Payer: Self-pay

## 2020-11-07 ENCOUNTER — Ambulatory Visit (INDEPENDENT_AMBULATORY_CARE_PROVIDER_SITE_OTHER): Payer: BC Managed Care – PPO | Admitting: Podiatry

## 2020-11-07 DIAGNOSIS — G5791 Unspecified mononeuropathy of right lower limb: Secondary | ICD-10-CM | POA: Diagnosis not present

## 2020-11-07 DIAGNOSIS — G5792 Unspecified mononeuropathy of left lower limb: Secondary | ICD-10-CM

## 2020-11-07 NOTE — Progress Notes (Signed)
He presents today after having not seen him for years so for a chief complaint of similar pain to the forefoot bilaterally.  States that it started hurting between each individual toenail instead of just the third and fourth toes.  Objective: Vital signs are stable is alert x3 pulses are palpable palpable click third interdigital space resulting in tenderness to the other interdigital spaces.  No open lesions or wounds are noted.  Assessment: Neuroma third interspace bilateral.  Plan: Injected his first dehydrated alcohol injection to the third interdigital space today.

## 2020-11-25 ENCOUNTER — Telehealth: Payer: Self-pay | Admitting: Family Medicine

## 2020-11-25 DIAGNOSIS — R400 Somnolence: Secondary | ICD-10-CM

## 2020-11-25 DIAGNOSIS — G4733 Obstructive sleep apnea (adult) (pediatric): Secondary | ICD-10-CM

## 2020-11-25 DIAGNOSIS — R5383 Other fatigue: Secondary | ICD-10-CM

## 2020-11-25 NOTE — Telephone Encounter (Signed)
Error

## 2020-11-25 NOTE — Telephone Encounter (Signed)
1.  Please make a copy of the labs with my comments to send to the patient 2.  He may have referral for sleep study but please document the symptoms he is having currently that would indicate to him to need to have a sleep study (Snoring?  Pausing with breathing at night?  Fatigue or sleepiness during the day?)

## 2020-11-25 NOTE — Telephone Encounter (Signed)
Patient had physical 9/6 and would like a copy of his labs mailed to  him. He has myChart but does not have good reception where he lives so he cannot access it. He also wants a referral to have a sleep apnea screening. Please advise.  CB#:  [phone]

## 2020-11-26 NOTE — Telephone Encounter (Signed)
Labs printed and mailed to patient. Pt states he is having fatigue and daytime sleepiness. Referral to sleep studies (per [place]) placed.

## 2020-11-28 ENCOUNTER — Ambulatory Visit: Payer: BC Managed Care – PPO | Admitting: Podiatry

## 2020-12-17 ENCOUNTER — Other Ambulatory Visit: Payer: Self-pay | Admitting: Podiatry

## 2021-01-02 ENCOUNTER — Ambulatory Visit (INDEPENDENT_AMBULATORY_CARE_PROVIDER_SITE_OTHER): Payer: BC Managed Care – PPO | Admitting: Podiatry

## 2021-01-02 ENCOUNTER — Other Ambulatory Visit: Payer: Self-pay

## 2021-01-02 DIAGNOSIS — G5792 Unspecified mononeuropathy of left lower limb: Secondary | ICD-10-CM | POA: Diagnosis not present

## 2021-01-02 DIAGNOSIS — G5791 Unspecified mononeuropathy of right lower limb: Secondary | ICD-10-CM

## 2021-01-02 NOTE — Progress Notes (Signed)
He presents today for follow-up of his neuroma third interdigital space bilaterally states that really there is been no change since last visit.  Objective: He has pain on palpation third interdigital space bilateral.  Some tenderness on palpation of the second interdigital space bilaterally as well.  Assessment: Neuroma third interspace bilateral.  Plan: Reinjected his second dose of dehydrated alcohol today third interdigital space bilateral.

## 2021-01-21 ENCOUNTER — Telehealth: Payer: Self-pay | Admitting: *Deleted

## 2021-01-21 DIAGNOSIS — Z1283 Encounter for screening for malignant neoplasm of skin: Secondary | ICD-10-CM

## 2021-01-21 NOTE — Telephone Encounter (Signed)
Per reminder file: Patient needs f/u with cardiology and repeat Echo- patient scheduled with his cardiologist with repeat echo 03/17/2021 at 8am  Left message to return call to notify patient

## 2021-01-22 NOTE — Telephone Encounter (Signed)
Referral ordered in Epic- Patient aware.

## 2021-01-22 NOTE — Telephone Encounter (Signed)
There are options Locally Dr. Rozann Lesches, located at Dr. Juel Burrow office Dr. Denna Haggard in [facility] Dr. Sherley Bounds one of his partners at [facility] If he needs a referral please provide

## 2021-01-22 NOTE — Telephone Encounter (Signed)
Patient notified of appt for follow up and echo with Cardiology. Patient would also like recommendations for a dermatologist- he states his retired and would like to schedule with new one for mole checks etc

## 2021-01-29 ENCOUNTER — Encounter: Payer: Self-pay | Admitting: Dermatology

## 2021-01-29 ENCOUNTER — Ambulatory Visit: Payer: BC Managed Care – PPO | Admitting: Dermatology

## 2021-01-29 ENCOUNTER — Other Ambulatory Visit: Payer: Self-pay

## 2021-01-29 DIAGNOSIS — L738 Other specified follicular disorders: Secondary | ICD-10-CM | POA: Diagnosis not present

## 2021-01-29 DIAGNOSIS — L820 Inflamed seborrheic keratosis: Secondary | ICD-10-CM | POA: Diagnosis not present

## 2021-01-29 DIAGNOSIS — D485 Neoplasm of uncertain behavior of skin: Secondary | ICD-10-CM

## 2021-01-29 DIAGNOSIS — L739 Follicular disorder, unspecified: Secondary | ICD-10-CM | POA: Diagnosis not present

## 2021-01-29 DIAGNOSIS — L570 Actinic keratosis: Secondary | ICD-10-CM | POA: Diagnosis not present

## 2021-01-29 DIAGNOSIS — Z1283 Encounter for screening for malignant neoplasm of skin: Secondary | ICD-10-CM | POA: Diagnosis not present

## 2021-01-29 NOTE — Patient Instructions (Addendum)
Pt aware to pick up over the counter Scalpicin Scalp Itch Relief, if that fails call in clobetasol foam for the scalp.  Pick over the count pramoxine lotion could be cerva or gold bond. Apply to the forehead for itch.   Biopsy, Surgery (Curettage) & Surgery (Excision) Aftercare Instructions  1. Okay to remove bandage in 24 hours  2. Wash area with soap and water  3. Apply Vaseline to area twice daily until healed (Not Neosporin)  4. Okay to cover with a Band-Aid to decrease the chance of infection or prevent irritation from clothing; also it's okay to uncover lesion at home.  5. Suture instructions: return to our office in 7-10 or 10-14 days for a nurse visit for suture removal. Variable healing with sutures, if pain or itching occurs call our office. It's okay to shower or bathe 24 hours after sutures are given.  6. The following risks may occur after a biopsy, curettage or excision: bleeding, scarring, discoloration, recurrence, infection (redness, yellow drainage, pain or swelling).  7. For questions, concerns and results call our office at [facility] before 4pm & Friday before 3pm. Biopsy results will be available in 1 week.

## 2021-02-04 ENCOUNTER — Ambulatory Visit (INDEPENDENT_AMBULATORY_CARE_PROVIDER_SITE_OTHER): Payer: BC Managed Care – PPO | Admitting: Podiatry

## 2021-02-04 ENCOUNTER — Encounter: Payer: Self-pay | Admitting: Podiatry

## 2021-02-04 ENCOUNTER — Other Ambulatory Visit: Payer: Self-pay

## 2021-02-04 DIAGNOSIS — G5781 Other specified mononeuropathies of right lower limb: Secondary | ICD-10-CM

## 2021-02-04 DIAGNOSIS — G5782 Other specified mononeuropathies of left lower limb: Secondary | ICD-10-CM

## 2021-02-05 NOTE — Progress Notes (Signed)
He presents today for follow-up of neuroma third interdigital space bilateral I think they are getting better he says.  Objective: Pulses are palpable neurologic sensorium is intact though he does have a palpable Mulder's click third interdigital space bilaterally with some tenderness to the second interdigital space as well.  No open lesions or wounds muscle strength is normal symmetrical.  Assessment: Neuroma resolving third interdigital space.  Plan: Injected dehydrated alcohol and 1/2 cc 4% with local anesthetic to the third interdigital space bilateral.  Follow-up with him in 1 month if necessary

## 2021-02-21 ENCOUNTER — Encounter: Payer: Self-pay | Admitting: Dermatology

## 2021-02-21 NOTE — Progress Notes (Signed)
Follow-Up Visit   Subjective  Phillip Mcguire is a 66 y.o. male who presents for the following: Annual Exam (Patient here today for yearly skin check. Per patient he has bumps that pop up on his forehead x 6 months to a year that itch, no bleeding per patient he's used over the counter ointment to help with the itch which helped some. Patient states he has a lesion on his left forearm x 1 month no bleeding, no pain. No personal history or family history of atypical moles, melanoma or non mole skin cancer. ).  General skin examination, bothered by spots on forehead and scalp that itch plus new growth on left arm Location:  Duration:  Quality:  Associated Signs/Symptoms: Modifying Factors:  Severity:  Timing: Context:   Objective  Well appearing patient in no apparent distress; mood and affect are within normal limits. Left Forearm - Anterior Verrucous focally eroded 5 mm tan papule       Scalp General skin examination, no atypical pigmented spots.  1 possible nonmelanoma skin cancer on arm will be biopsied. Milium on left forehead and left temple. Cyst right upper chest, sk on left bicep, right clavicle  Left Buccal Cheek (2), Right Nasal Sidewall 3 hornlike 2 to 4 mm pink crusts  Mid Parietal Scalp Inflamed and lichenified follicular papules  Mid Forehead Several 2 mm eccentrically umbilicated flesh-colored papules, typical dermoscopy    A full examination was performed including scalp, head, eyes, ears, nose, lips, neck, chest, axillae, abdomen, back, buttocks, bilateral upper extremities, bilateral lower extremities, hands, feet, fingers, toes, fingernails, and toenails. All findings within normal limits unless otherwise noted below.   Assessment & Plan    Neoplasm of uncertain behavior of skin Left Forearm - Anterior  Skin / nail biopsy Type of biopsy: tangential   Informed consent: discussed and consent obtained   Timeout: patient name, date of birth,  surgical site, and procedure verified   Anesthesia: the lesion was anesthetized in a standard fashion   Anesthetic:  1% lidocaine w/ epinephrine 1-100,000 local infiltration Instrument used: flexible razor blade   Hemostasis achieved with: aluminum chloride and electrodesiccation   Outcome: patient tolerated procedure well   Post-procedure details: wound care instructions given    Specimen 1 - Surgical pathology Differential Diagnosis: bcc vs scc  Check Margins: No  Encounter for screening for malignant neoplasm of skin Scalp  Annual skin examination.  AK (actinic keratosis) (3) Right Nasal Sidewall; Left Buccal Cheek (2)  Destruction of lesion - Left Buccal Cheek, Right Nasal Sidewall Complexity: simple   Destruction method: cryotherapy   Informed consent: discussed and consent obtained   Timeout:  patient name, date of birth, surgical site, and procedure verified Lesion destroyed using liquid nitrogen: Yes   Cryotherapy cycles:  3 Outcome: patient tolerated procedure well with no complications   Post-procedure details: wound care instructions given    Folliculitis Mid Parietal Scalp  Pt aware to pick up over the counter Scalpicin Scalp Itch Relief, if that fails call in clobetasol foam for the scalp.  Sebaceous gland hyperplasia Mid Forehead  Pick over the count pramoxine lotion could be cerva or gold bond. Apply to the forehead for itch.  Told of similar appearance of early [facility] so if any lesions grow or bleed return for biopsy      I, Lavonna Monarch, MD, have reviewed all documentation for this visit.  The documentation on 02/21/21 for the exam, diagnosis, procedures, and orders are all accurate and  complete.

## 2021-03-03 ENCOUNTER — Encounter: Payer: Self-pay | Admitting: Neurology

## 2021-03-03 ENCOUNTER — Ambulatory Visit: Payer: BC Managed Care – PPO | Admitting: Neurology

## 2021-03-03 VITALS — BP 139/70 | HR 57 | Ht 70.5 in | Wt 226.0 lb

## 2021-03-03 DIAGNOSIS — G478 Other sleep disorders: Secondary | ICD-10-CM

## 2021-03-03 DIAGNOSIS — F4381 Prolonged grief disorder: Secondary | ICD-10-CM

## 2021-03-03 DIAGNOSIS — G4719 Other hypersomnia: Secondary | ICD-10-CM | POA: Diagnosis not present

## 2021-03-03 DIAGNOSIS — H6240 Otitis externa in other diseases classified elsewhere, unspecified ear: Secondary | ICD-10-CM

## 2021-03-03 DIAGNOSIS — R0683 Snoring: Secondary | ICD-10-CM

## 2021-03-03 DIAGNOSIS — J324 Chronic pansinusitis: Secondary | ICD-10-CM

## 2021-03-03 DIAGNOSIS — B369 Superficial mycosis, unspecified: Secondary | ICD-10-CM

## 2021-03-03 DIAGNOSIS — H7010 Chronic mastoiditis, unspecified ear: Secondary | ICD-10-CM

## 2021-03-03 MED ORDER — TRAZODONE HCL 50 MG PO TABS
25.0000 mg | ORAL_TABLET | Freq: Every evening | ORAL | 2 refills | Status: DC | PRN
Start: 2021-03-03 — End: 2021-03-10

## 2021-03-03 NOTE — Progress Notes (Signed)
SLEEP MEDICINE CLINIC    Provider:  Larey Seat, MD  Primary Care Physician:  Kathyrn Drown, [place] [id number]     Referring Provider: Dr Percell Belt         Chief Complaint according to patient   Patient presents with:     New Patient (Initial Visit)           HISTORY OF PRESENT ILLNESS:  Phillip Mcguire is a 66 y.o. year old White or Caucasian male patient seen here as a referral on 03/03/2021 from Dr Jaynee Eagles - This patient has never been a sleep patient at [facility] and wants to consider [facility]. He uses a 66 year -old CPAP machine but logged only  for a  11 days use out of 90 days. He reports non -restorative sleep.   Chief concern according to patient :  Its been worse since my wife passes away, but I wake up all night. And I haven't felt much better when I sued CPAP. Diagnosed with OSA in 2000 by Dr Annamaria Boots. In 2010 he underwent a test under dr Merlene Laughter at [facility] and was undiagnosed- he had no apnea.    Phillip Mcguire  has a past medical history of Abnormal LFTs, Allergy, Congenital absence of kidney, GERD (gastroesophageal reflux disease), Hearing loss, and Sleep apnea. He has used CPAP on and off for 20 some years, was never followed here.  He lost his wife 12-22-2018 to [place], she had suffered from [place].    Sleep relevant medical history: no Tonsillectomy- deviated septum repair 30 years ago.     Family medical /sleep history: son with OSA.    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  One adult son and 2 grandchildren. The patient currently works first shift.  Cat is present. Tobacco use- quit 1990.  ETOH use ; rarely,  Caffeine intake in form of Coffee( 2 16 ounces) . Regular exercise I- none  Hobbies :none       Sleep habits are as follows: The patient's dinner time is between 5-6 PM. The patient goes to bed at 8.30 PM and continues to sleep for  intervals of 1 hour, wakes hourly, the first time at 12 AM.   The preferred sleep position is on his  side, with the support of one pillow.  Dreams are reportedly rare.  3.30 AM is the usual rise time. The patient wakes up before his alarm.  He reports not feeling refreshed or restored in AM, with symptoms such as dry mouth, and congestion.  Naps are taken rarely- .   Review of Systems: Out of a complete 14 system review, the patient complains of only the following symptoms, and all other reviewed systems are negative.:  Fatigue, sleepiness , snoring, fragmented sleep, Insomnia -    How likely are you to doze in the following situations: 0 = not likely, 1 = slight chance, 2 = moderate chance, 3 = high chance   Sitting and Reading? Watching Television? Sitting inactive in a public place (theater or meeting)? As a passenger in a car for an hour without a break? Lying down in the afternoon when circumstances permit? Sitting and talking to someone? Sitting quietly after lunch without alcohol? In a car, while stopped for a few minutes in traffic?   Total = 11/ 24 points   FSS endorsed at  na/ 63 points.  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    Comment: [employer]  Tobacco Use   Smoking status: Former    Packs/day: 1.50    Years: 15.00    Pack years: 22.50    Types: Cigarettes    Quit date: 01/13/1988    Years since quitting: 33.1   Smokeless tobacco: Former  Scientific laboratory technician Use: Never used  Substance and Sexual Activity   Alcohol use: Yes    Alcohol/week: 0.0 standard drinks    Comment: rarely drink beer   Drug use: No   Sexual activity: Yes    Partners: Female  Other Topics Concern   Not on file  Social History Narrative   He works in Theatre manager,  Energy manager   Lives with wife.     Caffeine use: 2 cups coffee per day   Social Determinants of Health   Financial Resource Strain: Not on file  Food Insecurity: Not on file  Transportation Needs: Not on file  Physical Activity: Not on file  Stress: Not on file  Social Connections: Not on file    Family History  Problem Relation Age of Onset   Heart disease Mother    Hypertension Mother    Diabetes Mother    Hyperlipidemia Mother    Heart disease Father    Lung cancer Father    Hyperlipidemia Father    Diabetes Father    Allergies Son    Cancer Maternal Aunt        breast   Cancer Maternal Uncle        colon   Cancer Paternal Aunt        breast   Cancer Paternal Uncle        brain tumor   Diabetes Sister    Neuropathy Neg Hx     Past Medical History:  Diagnosis Date   Abnormal LFTs    mild elevation of total, indirect and direct bilirubin   Allergy    Congenital absence of kidney    Right   GERD (gastroesophageal reflux disease)    Hearing loss    Sleep apnea     Past Surgical History:  Procedure Laterality Date   COLONOSCOPY  2007   [place]k: normal   COLONOSCOPY N/A 12/26/2015   Procedure: COLONOSCOPY;  Surgeon: Daneil Dolin, MD;  Location: [facility];  Service: Endoscopy;  Laterality: N/A;  2:00 PM   ESOPHAGOGASTRODUODENOSCOPY  01/2002   Dr. Deatra Ina: Normal   NAILBED REPAIR Right 06/14/2018   Procedure: RECONSTRUCTION NAILBED WITH PALMARIS GRAFT RIGHT THUMB;  Surgeon: Daryll Brod, MD;  Location: [facility];  Service: Orthopedics;  Laterality: Right;  AXILLARY BLOCK   NASAL SEPTUM SURGERY  1980's   otosclerosis     bilateral ear surgery   SHOULDER ARTHROSCOPY W/ ROTATOR CUFF REPAIR Left 2003   titanium implant Left March 2010   left middle ear     Current Outpatient Medications on File Prior to Visit  Medication Sig Dispense Refill   albuterol (VENTOLIN HFA) 108 (90 Base) MCG/ACT inhaler Inhale 2 puffs into the lungs every 6  (six) hours as needed for wheezing or shortness of breath. Inhale 2 puffs into lungs QID  prn for wheezing/cough 18 g 3   aspirin EC 81 MG tablet Take 1 tablet (81 mg total) by mouth daily. Swallow whole. 90 tablet 3   atorvastatin (LIPITOR) 20 MG tablet TAKE 1 TABLET BY MOUTH EVERY DAY 90 tablet 1   azelastine (ASTELIN) 0.1 % nasal spray Place 1 spray into both nostrils 2 (two) times daily. Use in each nostril as directed 30 mL 12   CALCIUM-VITAMIN D PO Take 1 tablet by mouth daily. 200 units calcium, 1100units vit D     Cholecalciferol (VITAMIN D) 2000 units CAPS Take 2,000 Units by mouth daily.     clotrimazole-betamethasone (LOTRISONE) lotion Apply topically.     fluticasone (FLONASE) 50 MCG/ACT nasal spray Place 1 spray into both nostrils daily.     gabapentin (NEURONTIN) 100 MG capsule TAKE 1 CAPSULE BY MOUTH EVERY MORNING AND 2 CAPSULES EVERY EVENING AND 3 CAPSULES AT BEDITME 540 capsule 0   loratadine (CLARITIN) 10 MG tablet Take 10 mg by mouth daily.     Magnesium Oxide 200 MG TABS Take 200 mg by mouth daily.     Multiple Vitamin (MULTIVITAMIN) capsule Take 1 capsule by mouth daily.     omeprazole (PRILOSEC) 20 MG capsule Take 20 mg by mouth daily.      OVER THE COUNTER MEDICATION Take 1 tablet by mouth daily. Zinc     Probiotic Product (PROBIOTIC PO) Take 1 Dose by mouth daily. 300mg      vitamin C (ASCORBIC ACID) 500 MG tablet Take 500 mg by mouth daily.     Current Facility-Administered Medications on File Prior to Visit  Medication Dose Route Frequency Provider Last Rate Last Admin   triamcinolone acetonide (KENALOG-40) injection 20 mg  20 mg Other Once [place], [place] T, [place]        Allergies  Allergen Reactions   Latex Rash    Physical exam:  Today's Vitals   03/03/21 1522  BP: 139/70  Pulse: (!) 57  SpO2: 97%  Weight: 226 lb (102.5 kg)  Height: 5' 10.5" (1.791 m)   Body mass index is 31.97 kg/m.   Wt Readings from Last 3 Encounters:  03/03/21 226 lb (102.5 kg)   10/07/20 217 lb 6.4 oz (98.6 kg)  08/13/20 219 lb (99.3 kg)     Ht Readings from Last 3 Encounters:  03/03/21 5' 10.5" (1.791 m)  10/07/20 5' 10.5" (1.791 m)  08/13/20 6' (1.829 m)      General: The patient is awake, alert and appears not in acute distress.  Head: Normocephalic, atraumatic. Neck is supple.facial hair.   Mallampati  3,  neck circumference:17 inches . Nasal airflow barely patent.  Retrognathia is not  seen.  Dental status:  Cardiovascular:  Regular rate and cardiac rhythm by pulse,  without distended neck veins. Respiratory: Lungs are clear to auscultation.  Skin:  Without evidence of ankle edema, or rash. Trunk: The patient's posture is erect.   Neurologic exam : The patient is awake and alert, oriented to place and time.   Memory subjective described as intact.  Attention span & concentration ability appears normal.  Speech is fluent,  with dysphonia.  Mood and affect are appropriate.   Cranial nerves: no loss of smell or taste reported  Pupils are equal and briskly reactive to light. Funduscopic exam deferred. .  Extraocular movements in vertical and horizontal planes were intact and without nystagmus.  No Diplopia. Visual fields by finger perimetry are intact. Hearing was impaired to  finger rubbing.  Facial sensation intact to fine touch.  Facial motor strength is symmetric and tongue and uvula move midline.  Neck ROM : rotation, tilt and flexion extension were normal for age and shoulder shrug was symmetrical.    Motor exam:  Symmetric bulk, tone and ROM.   Normal tone without cog- wheeling, symmetric grip strength .   Sensory:  Fine touch, pinprick and vibration were tested  and  normal.  Proprioception tested in the upper extremities was normal.   Coordination: Rapid alternating movements in the fingers/hands were of normal speed.  The Finger-to-nose maneuver was intact without evidence of ataxia, dysmetria or tremor.   Gait and station:  Patient could rise unassisted from a seated position, walked without assistive device.  Stance is of normal width/ base .  Toe and heel walk were deferred.  Deep tendon reflexes: in the  upper and lower extremities are symmetric and intact.  Brisk patella DTRs. .  Babinski response was deferred .       After spending a total time of  45  minutes face to face and additional time for physical and neurologic examination, review of laboratory studies,  personal review of imaging studies, reports and results of other testing and review of referral information / records as far as provided in visit, I have established the following assessments:  The patient reports nonrestorative sleep he feels neither refreshed nor restored in the morning.  He is going to bed early but by midnight he will wake up on the hour every hour and feels that his sleep has a lower quality is unusual.  He was told that he is a snorer by his late wife however the only sleep study that is scored into epic tells [place] that he had no apnea as of 06/13/2010.  1) OSA ? documented Sleep study from 06-13-2010 was filed under 03-07-2015 . No apnea was seen !   2) fatigue and insomnia related to grief.   3) history of loud snoring , his late wife liked him to use CPAP to control the volume.   My Plan is to proceed with:  1) confirm apnea or no apnea. Patient with nasal airway congestion. Loud snoring.  2) Confirm sleep pattern, arousals and review causes of insomnia.  3) Trazodone to start - 50 mg tab ordered to be taken at bedtime.   I would like to thank Kathyrn Drown, MD and Kathyrn Drown, [address],  [place] [address] for allowing me to meet with and to take care of this pleasant patient.   In short, Phillip Mcguire is presenting with fatigue and poor sleep quality. I plan to follow up  Prn either personally or through our NP within 2-4 months if this patient has apnea. .   CC: I will share my notes with  PCP.  Electronically signed by: Larey Seat, MD 03/03/2021 3:42 PM  [facility] Sleep Board certified by The [employer] of Sleep Medicine and Diplomate of the [employer] of Sleep Medicine. Board certified In Neurology through the [place], Fellow of the [employer] of Neurology. Medical Director of [employer].

## 2021-03-03 NOTE — Patient Instructions (Signed)
Trazodone Tablets What is this medication? TRAZODONE (TRAZ oh done) treats depression. It increases the amount of serotonin in the brain, a hormone that helps regulate mood. This medicine may be used for other purposes; ask your health care provider or pharmacist if you have questions. COMMON BRAND NAME(S): Desyrel What should I tell my care team before I take this medication? They need to know if you have any of these conditions: Attempted suicide or thinking about it Bipolar disorder Bleeding problems Glaucoma Heart disease, or previous heart attack Irregular heart beat Kidney or liver disease Low levels of sodium in the blood An unusual or allergic reaction to trazodone, other medications, foods, dyes or preservatives Pregnant or trying to get pregnant Breast-feeding How should I use this medication? Take this medication by mouth with a glass of water. Follow the directions on the prescription label. Take this medication shortly after a meal or a light snack. Take your medication at regular intervals. Do not take your medication more often than directed. Do not stop taking this medication suddenly except upon the advice of your care team. Stopping this medication too quickly may cause serious side effects or your condition may worsen. A special MedGuide will be given to you by the pharmacist with each prescription and refill. Be sure to read this information carefully each time. Talk to your care team regarding the use of this medication in children. Special care may be needed. Overdosage: If you think you have taken too much of this medicine contact a poison control center or emergency room at once. NOTE: This medicine is only for you. Do not share this medicine with others. What if I miss a dose? If you miss a dose, take it as soon as you can. If it is almost time for your next dose, take only that dose. Do not take double or extra doses. What may interact with this medication? Do not  take this medication with any of the following: Certain medications for fungal infections like fluconazole, itraconazole, ketoconazole, posaconazole, voriconazole Cisapride Dronedarone Linezolid MAOIs like Carbex, Eldepryl, Marplan, Nardil, and Parnate Mesoridazine Methylene blue (injected into a vein) Pimozide Saquinavir Thioridazine This medication may also interact with the following: Alcohol Antiviral medications for HIV or AIDS Aspirin and aspirin-like medications Barbiturates like phenobarbital Certain medications for blood pressure, heart disease, irregular heart beat Certain medications for depression, anxiety, or psychotic disturbances Certain medications for migraine headache like almotriptan, eletriptan, frovatriptan, naratriptan, rizatriptan, sumatriptan, zolmitriptan Certain medications for seizures like carbamazepine and phenytoin Certain medications for sleep Certain medications that treat or prevent blood clots like dalteparin, enoxaparin, warfarin Digoxin Fentanyl Lithium NSAIDS, medications for pain and inflammation, like ibuprofen or naproxen Other medications that prolong the QT interval (cause an abnormal heart rhythm) like dofetilide Rasagiline Supplements like St. John's wort, kava kava, valerian Tramadol Tryptophan This list may not describe all possible interactions. Give your health care provider a list of all the medicines, herbs, non-prescription drugs, or dietary supplements you use. Also tell them if you smoke, drink alcohol, or use illegal drugs. Some items may interact with your medicine. What should I watch for while using this medication? Tell your care team if your symptoms do not get better or if they get worse. Visit your care team for regular checks on your progress. Because it may take several weeks to see the full effects of this medication, it is important to continue your treatment as prescribed by your care team. Watch for new or worsening  thoughts of  suicide or depression. This includes sudden changes in mood, behaviors, or thoughts. These changes can happen at any time but are more common in the beginning of treatment or after a change in dose. Call your care team right away if you experience these thoughts or worsening depression. Manic episodes may happen in patients with bipolar disorder who take this medication. Watch for changes in feelings or behaviors such as feeling anxious, nervous, agitated, panicky, irritable, hostile, aggressive, impulsive, severely restless, overly excited and hyperactive, or trouble sleeping. These changes can happen at any time but are more common in the beginning of treatment or after a change in dose. Call your care team right away if you notice any of these symptoms. You may get drowsy or dizzy. Do not drive, use machinery, or do anything that needs mental alertness until you know how this medication affects you. Do not stand or sit up quickly, especially if you are an older patient. This reduces the risk of dizzy or fainting spells. Alcohol may interfere with the effect of this medication. Avoid alcoholic drinks. This medication may cause dry eyes and blurred vision. If you wear contact lenses you may feel some discomfort. Lubricating drops may help. See your eye doctor if the problem does not go away or is severe. Your mouth may get dry. Chewing sugarless gum, sucking hard candy and drinking plenty of water may help. Contact your care team if the problem does not go away or is severe. What side effects may I notice from receiving this medication? Side effects that you should report to your care team as soon as possible: Allergic reactions--skin rash, itching, hives, swelling of the face, lips, tongue, or throat Bleeding--bloody or black, tar-like stools, red or dark brown urine, vomiting blood or brown material that looks like coffee grounds, small, red or purple spots on skin, unusual bleeding or  bruising Heart rhythm changes--fast or irregular heartbeat, dizziness, feeling faint or lightheaded, chest pain, trouble breathing Low blood pressure--dizziness, feeling faint or lightheaded, blurry vision Low sodium level--muscle weakness, fatigue, dizziness, headache, confusion Prolonged or painful erection Serotonin syndrome--irritability, confusion, fast or irregular heartbeat, muscle stiffness, twitching muscles, sweating, high fever, seizures, chills, vomiting, diarrhea Sudden eye pain or change in vision such as blurry vision, seeing halos around lights, vision loss Thoughts of suicide or self-harm, worsening mood, feelings of depression Side effects that usually do not require medical attention (report to your care team if they continue or are bothersome): Change in sex drive or performance Constipation Dizziness Drowsiness Dry mouth This list may not describe all possible side effects. Call your doctor for medical advice about side effects. You may report side effects to FDA at 1-800-FDA-1088. Where should I keep my medication? Keep out of the reach of children and pets. Store at room temperature between 15 and 30 degrees C (59 to 86 degrees F). Protect from light. Keep container tightly closed. Throw away any unused medication after the expiration date. NOTE: This sheet is a summary. It may not cover all possible information. If you have questions about this medicine, talk to your doctor, pharmacist, or health care provider.  2022 Elsevier/Gold Standard (2019-12-20 00:00:00) Screening for Sleep Apnea Sleep apnea is a condition in which breathing pauses or becomes shallow during sleep. Sleep apnea screening is a test to determine if you are at risk for sleep apnea. The test includes a series of questions. It will only takes a few minutes. Your health care provider may ask you to have this  test in preparation for surgery or as part of a physical exam. What are the symptoms of sleep  apnea? Common symptoms of sleep apnea include: Snoring. Waking up often at night. Daytime sleepiness. Pauses in breathing. Choking or gasping during sleep. Irritability. Forgetfulness. Trouble thinking clearly. Depression. Personality changes. Most people with sleep apnea do not know that they have it. What are the advantages of sleep apnea screening? Getting screened for sleep apnea can help: Ensure your safety. It is important for your health care providers to know whether or not you have sleep apnea, especially if you are having surgery or have other long-term (chronic) health conditions. Improve your health and allow you to get a better night's rest. Restful sleep can help you: Have more energy. Lose weight. Improve high blood pressure. Improve diabetes management. Prevent stroke. Prevent car accidents. What happens during the screening? Screening usually includes being asked a list of questions about your sleep quality. Some questions you may be asked include: Do you snore? Is your sleep restless? Do you have daytime sleepiness? Has a partner or spouse told you that you stop breathing during sleep? Have you had trouble concentrating or memory loss? What is your age? What is your neck circumference? To measure your neck, keep your back straight and gently wrap the tape measure around your neck. Put the tape measure at the middle of your neck, between your chin and collarbone. What is your sex assigned at birth? Do you have or are you being treated for high blood pressure? If your screening test is positive, you are at risk for the condition. Further testing may be needed to confirm a diagnosis of sleep apnea. Where to find more information You can find screening tools online or at your health care clinic. For more information about sleep apnea screening and healthy sleep, visit these websites: Centers for Disease Control and Prevention: http://www.wolf.info/ American Sleep Apnea  Association: www.sleepapnea.org Contact a health care provider if: You think that you may have sleep apnea. Summary Sleep apnea screening can help determine if you are at risk for sleep apnea. It is important for your health care providers to know whether or not you have sleep apnea, especially if you are having surgery or have other chronic health conditions. You may be asked to take a screening test for sleep apnea in preparation for surgery or as part of a physical exam. This information is not intended to replace advice given to you by your health care provider. Make sure you discuss any questions you have with your health care provider. Document Revised: 12/08/2019 Document Reviewed: 12/08/2019 Elsevier Patient Education  2022 [employer].

## 2021-03-04 ENCOUNTER — Encounter: Payer: Self-pay | Admitting: Podiatry

## 2021-03-04 ENCOUNTER — Other Ambulatory Visit: Payer: Self-pay

## 2021-03-04 ENCOUNTER — Ambulatory Visit: Payer: BC Managed Care – PPO | Admitting: Podiatry

## 2021-03-04 DIAGNOSIS — G5782 Other specified mononeuropathies of left lower limb: Secondary | ICD-10-CM | POA: Diagnosis not present

## 2021-03-04 DIAGNOSIS — G5781 Other specified mononeuropathies of right lower limb: Secondary | ICD-10-CM

## 2021-03-04 NOTE — Progress Notes (Signed)
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  Objective: Vital signs are stable he is alert and oriented x3 palpable Mulder's click third interspace bilaterally.  Assessment: Neuroma third interspace bilateral.  Plan: We injected his third interdigital space today dehydrated alcohol bilateral.  Follow-up with him in a couple of months

## 2021-03-07 ENCOUNTER — Telehealth: Payer: Self-pay

## 2021-03-07 NOTE — Telephone Encounter (Signed)
LVM with [facility] to get a DL report for appt on monday. Left fax number on VM.

## 2021-03-10 ENCOUNTER — Ambulatory Visit: Payer: BC Managed Care – PPO | Admitting: Pulmonary Disease

## 2021-03-10 ENCOUNTER — Other Ambulatory Visit: Payer: Self-pay

## 2021-03-10 ENCOUNTER — Encounter: Payer: Self-pay | Admitting: Pulmonary Disease

## 2021-03-10 VITALS — BP 138/76 | HR 64 | Temp 98.4°F | Ht 71.0 in | Wt 224.2 lb

## 2021-03-10 DIAGNOSIS — G4733 Obstructive sleep apnea (adult) (pediatric): Secondary | ICD-10-CM

## 2021-03-10 DIAGNOSIS — G4709 Other insomnia: Secondary | ICD-10-CM

## 2021-03-10 MED ORDER — DOXEPIN HCL 3 MG PO TABS: 3.0000 mg | ORAL_TABLET | Freq: Every evening | ORAL | 5 refills | Status: DC

## 2021-03-10 NOTE — Progress Notes (Signed)
[facility]  Chief Complaint  Patient presents with   Follow-up    Having issues with cpap. Has not used in over a month.     Past Surgical History:  He  has a past surgical history that includes otosclerosis; Nasal septum surgery (1980's); Esophagogastroduodenoscopy (01/2002); Colonoscopy (2007); titanium implant (Left, March 2010); Shoulder arthroscopy w/ rotator cuff repair (Left, 2003); Colonoscopy (N/A, 12/26/2015); and Nailbed repair (Right, 06/14/2018).  Past Medical History:  Allergies, Absent Rt kidney, GERD, Chronic otitis externa Lt ear, Neuropathy, HLD, A fib  Constitutional:  BP 138/76 (BP Location: Left Arm, Patient Position: Sitting)    Pulse 64    Temp 98.4 F (36.9 C) (Temporal)    Ht 5\' 11"  (1.803 m)    Wt 224 lb 3.2 oz (101.7 kg)    SpO2 97% Comment: ra   BMI 31.27 kg/m   Brief Summary:  Phillip Mcguire is a 66 y.o. male former smoker with obstructive sleep apnea and sleep maintenance insomnia.      Subjective:   He was seen by Dr. Asencion Partridge Dohmeier on 03/03/21 for sleep assessment, and wants to be assessed for  an Hackett device.  He had home sleep study on 07/24/10 with Dr. Danton Sewer.  This showed an AHI of 5 and SpO2 low of 80%.  He has not been able to tolerate CPAP due to mask and pressure.    He goes to bed at 830 pm.  He was recently started on trazodone.  This helped him sleep, but made his heart race.  He falls asleep quickly.  He wakes up every 2 to 3 hours.  He uses the bathroom once.  Denies restless legs.  Gets out of bed at 330 am.  He feels terrible all day long.  Doesn't feel like he is depressed.  Physical Exam:   Appearance - well kempt   ENMT - no sinus tenderness, no oral exudate, no LAN, Mallampati 3 airway, no stridor  Respiratory - equal breath sounds bilaterally, no wheezing or rales  CV - s1s2 regular rate and rhythm, no murmurs  Ext - no clubbing, no edema  Skin - no rashes  Psych -  normal mood and affect   Pulmonary testing:  PFT 07/02/20 >> FEV1 3.58 (95%), FEV1% 82, TLC 6.96 (93%), DLCO 103%  Chest Imaging:  Cardiac CT 04/10/20 >> calcium score 0, atherosclerosis  Sleep Tests:  HST 07/23/10 >> AHI 5, SpO2 low 80% CPAP 09/23/19 to 10/22/19 >> used on 28 of 30 nights with average 5 hrs 19.  Average AHI 0.2 with CPAP 4 cm H2O.  Cardiac Tests:  Echo 12/27/19 >> EF 50 to 55%, grade 1 DD  Social History:  He  reports that he quit smoking about 33 years ago. His smoking use included cigarettes. He has a 22.50 pack-year smoking history. He has quit using smokeless tobacco. He reports current alcohol use. He reports that he does not use drugs.  Family History:  His family history includes Allergies in his son; Cancer in his maternal aunt, maternal uncle, paternal aunt, and paternal uncle; Diabetes in his father, mother, and sister; Heart disease in his father and mother; Hyperlipidemia in his father and mother; Hypertension in his mother; Lung cancer in his father.     Assessment/Plan:   Obstructive sleep apnea. - he would prefer to continue sleep follow up in [place] with [facility] - he has not been able to tolerate CPAP therapy; he has  been tried on different pressure settings and mask types - will arrange for home sleep study to determine if he is a candidate for an Inspire device - discussed option of oral appliance also  Sleep maintenance insomnia. - discussed proper sleep hygiene and regular sleep wake schedule - trazodone caused palpitations - will have him try doxepin 3 mg nightly  Paroxysmal atrial fibrillation. - followed by Dr[facility] Rudean Haskell with [facility]  Time Spent Involved in Patient Care on Day of Examination:  36 minutes  Follow up:   Patient Instructions  Doxepin 3 mg nightly to help with sleep  Will schedule home sleep study  Follow up in 4 months  Medication List:   Allergies as of 03/10/2021       Reactions   Latex  Rash        Medication List        Accurate as of March 10, 2021  9:23 AM. If you have any questions, ask your nurse or doctor.          STOP taking these medications    traZODone 50 MG tablet Commonly known as: DESYREL Stopped by: Chesley Mires, MD       TAKE these medications    albuterol 108 (90 Base) MCG/ACT inhaler Commonly known as: VENTOLIN HFA Inhale 2 puffs into the lungs every 6 (six) hours as needed for wheezing or shortness of breath. Inhale 2 puffs into lungs QID prn for wheezing/cough   aspirin EC 81 MG tablet Take 1 tablet (81 mg total) by mouth daily. Swallow whole.   atorvastatin 20 MG tablet Commonly known as: LIPITOR TAKE 1 TABLET BY MOUTH EVERY DAY   azelastine 0.1 % nasal spray Commonly known as: ASTELIN Place 1 spray into both nostrils 2 (two) times daily. Use in each nostril as directed   CALCIUM-VITAMIN D PO Take 1 tablet by mouth daily. 200 units calcium, 1100units vit D   clotrimazole-betamethasone lotion Commonly known as: LOTRISONE Apply topically.   Doxepin HCl 3 MG Tabs Take 1 tablet (3 mg total) by mouth at bedtime. Started by: Chesley Mires, MD   fluticasone 50 MCG/ACT nasal spray Commonly known as: FLONASE Place 1 spray into both nostrils daily.   gabapentin 100 MG capsule Commonly known as: NEURONTIN TAKE 1 CAPSULE BY MOUTH EVERY MORNING AND 2 CAPSULES EVERY EVENING AND 3 CAPSULES AT BEDITME   loratadine 10 MG tablet Commonly known as: CLARITIN Take 10 mg by mouth daily.   Magnesium Oxide 200 MG Tabs Take 200 mg by mouth daily.   multivitamin capsule Take 1 capsule by mouth daily.   omeprazole 20 MG capsule Commonly known as: PRILOSEC Take 20 mg by mouth daily.   OVER THE COUNTER MEDICATION Take 1 tablet by mouth daily. Zinc   PROBIOTIC PO Take 1 Dose by mouth daily. 300mg    vitamin C 500 MG tablet Commonly known as: ASCORBIC ACID Take 500 mg by mouth daily.   Vitamin D 50 MCG (2000 UT) Caps Take  2,000 Units by mouth daily.        Signature:  Chesley Mires, MD [place] Pager - [phone] 03/10/2021, 9:23 AM

## 2021-03-10 NOTE — Patient Instructions (Signed)
Doxepin 3 mg nightly to help with sleep  Will schedule home sleep study  Follow up in 4 months

## 2021-03-16 NOTE — Progress Notes (Signed)
Cardiology Office Note:    Date:  03/17/2021   ID:  Phillip Mcguire, DOB 08-01-1955, MRN [id number]  PCP:  Kathyrn Drown, MD   [place]  Cardiologist:  Werner Lean, MD  Advanced Practice Provider:  No care team member to display Electrophysiologist:  None      CC: AF follow up  History of Present Illness:    Phillip Mcguire is a 66 y.o. male with a hx of OSA, Atrial Fibrillation, Borderline Thoracic Aortic Aneurysm who presents for evaluation 03/21/20. Started on ASA since last visit.  Had normal CCTA.  Seen in 2023.  Patient notes that he is doing not too much but work.   Notes that he is chronically fatigued.  His sx are different that his AF when he thought he was having a heart attack. There are no interval hospital/ED visit.    No chest pain or pressure .  No SOB/DOE and no PND/Orthopnea.  No weight gain or leg swelling.  No palpitations.  No syncope.  Notes that just about every day he feels like he is going to pass out.  Also notes really bad headaches that are new.   Past Medical History:  Diagnosis Date   Abnormal LFTs    mild elevation of total, indirect and direct bilirubin   Allergy    Congenital absence of kidney    Right   GERD (gastroesophageal reflux disease)    Hearing loss    Sleep apnea     Past Surgical History:  Procedure Laterality Date   COLONOSCOPY  2007   [place]k: normal   COLONOSCOPY N/A 12/26/2015   Procedure: COLONOSCOPY;  Surgeon: Daneil Dolin, MD;  [facility];  Service: Endoscopy;  Laterality: N/A;  2:00 PM   ESOPHAGOGASTRODUODENOSCOPY  01/2002   Dr. Deatra Ina: Normal   NAILBED REPAIR Right 06/14/2018   Procedure: RECONSTRUCTION NAILBED WITH PALMARIS GRAFT RIGHT THUMB;  Surgeon: Daryll Brod, MD;  Location: [facility];  Service: Orthopedics;  Laterality: Right;  AXILLARY BLOCK   NASAL SEPTUM SURGERY  1980's   otosclerosis     bilateral ear surgery   SHOULDER ARTHROSCOPY W/ ROTATOR  CUFF REPAIR Left 2003   titanium implant Left March 2010   left middle ear    Current Medications: Current Meds  Medication Sig   albuterol (VENTOLIN HFA) 108 (90 Base) MCG/ACT inhaler Inhale 2 puffs into the lungs every 6 (six) hours as needed for wheezing or shortness of breath. Inhale 2 puffs into lungs QID prn for wheezing/cough   aspirin EC 81 MG tablet Take 1 tablet (81 mg total) by mouth daily. Swallow whole.   atorvastatin (LIPITOR) 20 MG tablet TAKE 1 TABLET BY MOUTH EVERY DAY   azelastine (ASTELIN) 0.1 % nasal spray Place 1 spray into both nostrils 2 (two) times daily. Use in each nostril as directed   CALCIUM-VITAMIN D PO Take 1 tablet by mouth daily. 200 units calcium, 1100units vit D   Cholecalciferol (VITAMIN D) 2000 units CAPS Take 2,000 Units by mouth daily.   clotrimazole-betamethasone (LOTRISONE) lotion Apply topically.   Doxepin HCl 3 MG TABS Take 1 tablet (3 mg total) by mouth at bedtime.   fluticasone (FLONASE) 50 MCG/ACT nasal spray Place 1 spray into both nostrils daily.   gabapentin (NEURONTIN) 100 MG capsule TAKE 1 CAPSULE BY MOUTH EVERY MORNING AND 2 CAPSULES EVERY EVENING AND 3 CAPSULES AT BEDITME   loratadine (CLARITIN) 10 MG tablet Take 10  mg by mouth daily.   Magnesium Oxide 200 MG TABS Take 200 mg by mouth daily.   Multiple Vitamin (MULTIVITAMIN) capsule Take 1 capsule by mouth daily.   omeprazole (PRILOSEC) 20 MG capsule Take 20 mg by mouth daily.    OVER THE COUNTER MEDICATION Take 1 tablet by mouth daily. Zinc   Probiotic Product (PROBIOTIC PO) Take 1 Dose by mouth daily. '300mg'$    vitamin C (ASCORBIC ACID) 500 MG tablet Take 500 mg by mouth daily.   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    Comment: [employer]  Tobacco Use   Smoking status: Former    Packs/day: 1.50    Years: 15.00    Pack years: 22.50    Types: Cigarettes    Quit date: 01/13/1988    Years since quitting: 33.1   Smokeless tobacco: Former  Scientific laboratory technician Use: Never used  Substance and Sexual Activity   Alcohol use: Yes    Alcohol/week: 0.0 standard drinks    Comment: rarely drink beer   Drug use: No   Sexual activity: Yes    Partners: Female  Other Topics Concern   Not on file  Social History Narrative   He works in Theatre manager, Energy manager   Lives with wife.     Caffeine use: 2 cups coffee per day   Social Determinants of Health   Financial Resource Strain: Not on file  Food Insecurity: Not on file  Transportation Needs: Not on file  Physical Activity: Not on file  Stress: Not on file  Social Connections: Not on file     Family History: The patient's family history includes Allergies in his son; Cancer in his maternal aunt, maternal uncle, paternal aunt, and paternal uncle; Diabetes in his father, mother, and sister; Heart disease in his father and mother; Hyperlipidemia in his father and mother; Hypertension in his mother; Lung cancer in his father. There is no history of Neuropathy. History of coronary artery disease notable for father. History of heart failure notable for no members. History of arrhythmia notable for mother (needed defibrillator NOS). Denies family history of sudden cardiac death including drowning, car accidents, or unexplained deaths in the family. No history of bicuspid aortic valve or aortic aneurysm or dissection.   Grandmother had some type of aneurysm (may have been brain).   ROS:   Please see the history of present illness.     All other systems reviewed and are negative.  EKGs/Labs/Other Studies  Reviewed:    The following studies were reviewed today:  EKG:   08/13/20:  SR rate 60 WNL 03/21/20: SR rate 60 WNL  Transthoracic Echocardiogram: Date: 12/27/2019 Results:  1. Left ventricular ejection fraction, by estimation, is 50 to 55%. The  left ventricle has low normal function. The left ventricle has no regional  wall motion abnormalities. The left ventricular internal cavity size was  mildly dilated. Left ventricular  diastolic parameters are consistent with Grade I diastolic dysfunction  (impaired relaxation). The average left ventricular global longitudinal  strain is -18.3 %.  The global longitudinal strain is normal.   2. Right ventricular systolic function is normal. The right ventricular  size is normal.   3. The mitral valve is normal in structure. Trivial mitral valve  regurgitation. No evidence of mitral stenosis.   4. The aortic valve is tricuspid. Aortic valve regurgitation is not  visualized. No aortic stenosis is present.   5. Aortic dilatation noted. There is mild dilatation at the level of the  sinuses of Valsalva, measuring 37 mm.   6. The inferior vena cava is normal in size with greater than 50%  respiratory variability, suggesting right atrial pressure of 3 mmHg.   Cardiac CT: Date: 04/10/2020 Results: IMPRESSION: 1. Coronary calcium score of 0.   2. Normal coronary origin with right dominance.   3. Nonobstructive CAD with noncalcified plaque causing minimal (0-24%) stenosis in ostial LAD   CAD-RADS 1. Minimal non-obstructive CAD (0-24%). Consider non-atherosclerotic causes of chest pain. Consider preventive therapy and risk factor modification.   ECG Stress Test: Date: 10/31/2015 Results:  Blood pressure demonstrated a normal response to exercise. There was no ST segment deviation noted during stress. Low risk Duke treadmill score portends a favorable prognosis with respect to cardiac events.   Recent Labs: 10/07/2020: ALT 24; BUN 18;  Creatinine, Ser 1.03; Potassium 4.6; Sodium 141  Recent Lipid Panel    Component Value Date/Time   CHOL 162 10/07/2020 0838   TRIG 84 10/07/2020 0838   HDL 56 10/07/2020 0838   CHOLHDL 2.9 10/07/2020 0838   CHOLHDL 4.1 07/24/2013 0804   VLDL 25 07/24/2013 0804   LDLCALC 90 10/07/2020 0838    Risk Assessment/Calculations:    CHA2DS2-VASc Score = 0  This indicates a 0.2% annual risk of stroke. The patient's score is based upon: CHF History: 0 HTN History: 0 Diabetes History: 0 Stroke History: 0 Vascular Disease History: 0 Age Score: 0 Gender Score: 0      Physical Exam:    VS:  BP 112/62    Pulse (!) 57    Ht '5\' 11"'$  (1.803 m)    Wt 222 lb 9.6 oz (101 kg)    SpO2 97%    BMI 31.05 kg/m     Wt Readings from Last 3 Encounters:  03/17/21 222 lb 9.6 oz (101 kg)  03/10/21 224 lb 3.2 oz (101.7 kg)  03/03/21 226 lb (102.5 kg)     Gen: no distress   Neck: No JVD Cardiac: No Rubs or Gallops, no murmur, regular bradycardia, +2 radial pulses Respiratory: Clear to auscultation bilaterally, normal effort, normal  respiratory rate GI: Soft, nontender, non-distended  MS: No  edema;  moves all extremities Integument: Skin feels warm Neuro:  At time of evaluation, alert and oriented to person/place/time/situation  Psych: Normal affect, patient feels ok   ASSESSMENT:    1. Fatigue, unspecified type   2. PAF (paroxysmal atrial fibrillation) ([place])   3. Aortic atherosclerosis (H[facility])      PLAN:    Fatigue Near syncope Paroxysmal Atrial Fibrillation  - CHADSVASC=1. - on ASA - Continue rate control with PRN diltiazem - will get TSH and Echo (LV function was low normal) - not sure fatigue is cardiac; has PCP f/u later this month  Aortic Atherosclerosis HLD Aorta at ULN - LDL 90 (increase in prior) - continue ASA - recheck Lipids and Alt, if above 70 will offer either lifestyle changes or atorvastatin 20   PA in 6 months  Me in one year  Medication  Adjustments/Labs and Tests Ordered: Current medicines are reviewed at length with the patient today.  Concerns regarding medicines are outlined above.  Orders Placed This Encounter  Procedures   TSH   ALT   Lipid panel   ECHOCARDIOGRAM COMPLETE    No orders of the defined types were placed in this encounter.    Patient Instructions  Medication Instructions:  Your physician recommends that you continue on your current medications as directed. Please refer to the Current Medication list given to you today.  *If you need a refill on your cardiac medications before your next appointment, please call your pharmacy*   Lab Work: TODAY: FLP, ALT, and TSH If you have labs (blood work) drawn today and your tests are completely normal, you will receive your results only by: [place] (if you have MyChart) OR A paper copy in the mail If you have any lab test that is abnormal or we need to change your treatment, we will call you to review the results.   Testing/Procedures: Your physician has requested that you have an echocardiogram. Echocardiography is a painless test that uses sound waves to create images of your heart. It provides your doctor with information about the size and shape of your heart and how well your hearts chambers and valves are working. This procedure takes approximately one hour. There are no restrictions for this procedure.    Follow-Up: At [facility], you and your health needs are our priority.  As part of our continuing mission to provide you with exceptional heart care, we have created designated Provider Care Teams.  These Care Teams include your primary Cardiologist (physician) and Advanced Practice Providers (APPs -  Physician Assistants and Nurse Practitioners) who all work together to provide you with the care you need, when you need it.   Your next appointment:   6 month(s)  The format for your next appointment:   In Person  Provider:   Melina Copa, PA-C or Ermalinda Barrios, PA-C     Then, Werner Lean, MD will plan to see you again in 12 month(s).      Signed, Werner Lean, MD  03/17/2021 8:17 AM    [place]

## 2021-03-17 ENCOUNTER — Ambulatory Visit: Payer: BC Managed Care – PPO | Admitting: Internal Medicine

## 2021-03-17 ENCOUNTER — Other Ambulatory Visit: Payer: Self-pay

## 2021-03-17 ENCOUNTER — Encounter: Payer: Self-pay | Admitting: Internal Medicine

## 2021-03-17 VITALS — BP 112/62 | HR 57 | Ht 71.0 in | Wt 222.6 lb

## 2021-03-17 DIAGNOSIS — I700 Atherosclerosis of aorta: Secondary | ICD-10-CM | POA: Diagnosis not present

## 2021-03-17 DIAGNOSIS — I480 Paroxysmal atrial fibrillation: Secondary | ICD-10-CM

## 2021-03-17 DIAGNOSIS — R5383 Other fatigue: Secondary | ICD-10-CM | POA: Diagnosis not present

## 2021-03-17 LAB — TSH: TSH: 1.31 u[IU]/mL (ref 0.450–4.500)

## 2021-03-17 LAB — LIPID PANEL
Chol/HDL Ratio: 2.7 ratio (ref 0.0–5.0)
Cholesterol, Total: 125 mg/dL (ref 100–199)
HDL: 47 mg/dL (ref >39)
LDL Chol Calc (NIH): 64 mg/dL (ref 0–99)
Triglycerides: 64 mg/dL (ref 0–149)
VLDL Cholesterol Cal: 14 mg/dL (ref 5–40)

## 2021-03-17 LAB — ALT: ALT: 19 IU/L (ref 0–44)

## 2021-03-17 NOTE — Patient Instructions (Signed)
Medication Instructions:  ?Your physician recommends that you continue on your current medications as directed. Please refer to the Current Medication list given to you today. ? ?*If you need a refill on your cardiac medications before your next appointment, please call your pharmacy* ? ? ?Lab Work: ?TODAY: FLP, ALT, and TSH ?If you have labs (blood work) drawn today and your tests are completely normal, you will receive your results only by: ?MyChart Message (if you have MyChart) OR ?A paper copy in the mail ?If you have any lab test that is abnormal or we need to change your treatment, we will call you to review the results. ? ? ?Testing/Procedures: ?Your physician has requested that you have an echocardiogram. Echocardiography is a painless test that uses sound waves to create images of your heart. It provides your doctor with information about the size and shape of your heart and how well your heart?s chambers and valves are working. This procedure takes approximately one hour. There are no restrictions for this procedure. ? ? ? ?Follow-Up: ?At [facility], you and your health needs are our priority.  As part of our continuing mission to provide you with exceptional heart care, we have created designated Provider Care Teams.  These Care Teams include your primary Cardiologist (physician) and Advanced Practice Providers (APPs -  Physician Assistants and Nurse Practitioners) who all work together to provide you with the care you need, when you need it. ? ? ?Your next appointment:   ?6 month(s) ? ?The format for your next appointment:   ?In Person ? ?Provider:   ?Melina Copa, PA-C or Ermalinda Barrios, PA-C     Then, Werner Lean, MD will plan to see you again in 12 month(s).  ? ? ?

## 2021-03-20 ENCOUNTER — Other Ambulatory Visit: Payer: Self-pay | Admitting: Podiatry

## 2021-03-24 ENCOUNTER — Ambulatory Visit (HOSPITAL_COMMUNITY): Payer: BC Managed Care – PPO | Attending: Internal Medicine

## 2021-03-24 ENCOUNTER — Other Ambulatory Visit: Payer: Self-pay

## 2021-03-24 DIAGNOSIS — I480 Paroxysmal atrial fibrillation: Secondary | ICD-10-CM | POA: Diagnosis present

## 2021-03-24 LAB — ECHOCARDIOGRAM COMPLETE
Area-P 1/2: 2.96 cm2
S' Lateral: 4.4 cm

## 2021-03-25 ENCOUNTER — Ambulatory Visit (INDEPENDENT_AMBULATORY_CARE_PROVIDER_SITE_OTHER): Payer: BC Managed Care – PPO | Admitting: Neurology

## 2021-03-25 DIAGNOSIS — F4329 Adjustment disorder with other symptoms: Secondary | ICD-10-CM

## 2021-03-25 DIAGNOSIS — G4733 Obstructive sleep apnea (adult) (pediatric): Secondary | ICD-10-CM | POA: Diagnosis not present

## 2021-03-25 DIAGNOSIS — R0683 Snoring: Secondary | ICD-10-CM

## 2021-03-25 DIAGNOSIS — F4381 Prolonged grief disorder: Secondary | ICD-10-CM

## 2021-03-25 DIAGNOSIS — G478 Other sleep disorders: Secondary | ICD-10-CM

## 2021-03-25 DIAGNOSIS — G4719 Other hypersomnia: Secondary | ICD-10-CM

## 2021-04-01 ENCOUNTER — Telehealth: Payer: Self-pay | Admitting: Neurology

## 2021-04-01 ENCOUNTER — Other Ambulatory Visit: Payer: Self-pay | Admitting: Neurology

## 2021-04-01 DIAGNOSIS — R0683 Snoring: Secondary | ICD-10-CM

## 2021-04-01 DIAGNOSIS — G478 Other sleep disorders: Secondary | ICD-10-CM | POA: Insufficient documentation

## 2021-04-01 DIAGNOSIS — G4719 Other hypersomnia: Secondary | ICD-10-CM

## 2021-04-01 DIAGNOSIS — G4733 Obstructive sleep apnea (adult) (pediatric): Secondary | ICD-10-CM

## 2021-04-01 DIAGNOSIS — F4381 Prolonged grief disorder: Secondary | ICD-10-CM | POA: Insufficient documentation

## 2021-04-01 DIAGNOSIS — F4329 Adjustment disorder with other symptoms: Secondary | ICD-10-CM | POA: Insufficient documentation

## 2021-04-01 NOTE — Progress Notes (Signed)
IMPRESSION: ?? ?1. Very mild Obstructive Sleep Apnea (OSA)- only hypopneas were noted, and those increased in the last hour of recording (fatigue related?) and during REM sleep.  ?2. Mild - moderate Snoring. ?3. Many Spontaneous arousals were noted.  ?? ?? ?? ?RECOMMENDATIONS: ?? ?1. CPAP or any other sleep apnea therapy is optional, given the low AHI (mild apnea). The threshold to perform a SPLIT protocol was not reached. ?2. Consider dental device for mild apnea and snoring and continue to avoid supine sleep.  ?3. Trazodone may further reduce the number of spontaneous arousals.

## 2021-04-01 NOTE — Telephone Encounter (Signed)
-----   Message from Larey Seat, MD sent at 04/01/2021  8:48 AM EDT ----- ?IMPRESSION: ?? ?1. Very mild Obstructive Sleep Apnea (OSA)- only hypopneas were noted, and those increased in the last hour of recording (fatigue related?) and during REM sleep.  ?2. Mild - moderate Snoring. ?3. Many Spontaneous arousals were noted.  ?? ?? ?? ?RECOMMENDATIONS: ?? ?1. CPAP or any other sleep apnea therapy is optional, given the low AHI (mild apnea). The threshold to perform a SPLIT protocol was not reached. ?2. Consider dental device for mild apnea and snoring and continue to avoid supine sleep.  ?3. Trazodone may further reduce the number of spontaneous arousals.   ?

## 2021-04-01 NOTE — Telephone Encounter (Signed)
Called the pt and reviewed the sleep study results with him in detail. Overall there was mild sleep apnea that was present. Advised that based off this information, Dr Dohmeier states CPAP would be optional. He has been a CPAP user and states he doesn't use it every night. He states that when he does use it he will note some benefit and so he definitely wants to continue to use it. He is not currently established with a local company. Advised I would get him set up locally. Pt states that he has a machine. I advised that he can tell the company when they call to set him up and they can have him take his machine in to further evaluate and help him with supplies. Pt verbalized understanding. ?Pt states that he wakes up in the mornings and stays tired til lunch time. States there are sometimes he feels light headed and has to sit down. I advised that if he is waking up not feeling well rested then using try using the cpap nightly to see if notes benefit with using it nightly instead of as needed. Advised that he should make sure that he is eating and staying hydrated in the mornings. He states he will try this.  ?

## 2021-04-01 NOTE — Procedures (Signed)
PATIENT'S NAME:  Phillip Mcguire, Phillip Mcguire ?DOB:      November 08, 1955      ?MR#:    [id number]     ?DATE OF RECORDING: 03/25/2021 by Forde Radon ?REFERRING M.D.:  Sarina Ill, MD ?Study Performed:   Baseline Polysomnogram ?HISTORY:  Phillip Mcguire is a 66 year old Caucasian male patient and was seen in a sleep consultation on 03/03/2021 requested by Dr Jaynee Eagles - This patient has never been a sleep patient at [facility] and wants to consider Inspire. He uses a 36-year-old CPAP machine but logged only 11 days use out of 90 days. He reports non -restorative sleep, excessive daytime sleepiness, fragmented sleep. He works first shift as an Clinical biochemist.  Diagnosed first with OSA in 2000 by Dr Annamaria Boots. In 2010 he underwent another sleep test under Dr Merlene Laughter at [facility] and was undiagnosed- he was told he no longer had apnea.  ?   ?Chief concern according to patient: ?It's been worse since my wife passes away (his spouse died in Jan 25, 2019 due to Covid, had Lymphoma) , but I wake up all night and I haven't felt much better when I used to use my CPAP.  ?Phillip Mcguire has a medical history of Abnormal LFTs, Allergies, Congenital absence of kidney, GERD (gastroesophageal reflux disease), Hearing loss, nasal septal surgery and Sleep apnea. He has used CPAP on and off for 20 some years, is new to [employer].  ? ?The patient endorsed the Epworth Sleepiness Scale at 11/24 points.   ?Fatigue Severity Score endorsed at 36/63 points, GDS ( geriatric depression score) at 2/ 15 points. ?The patient's weight 226 pounds with a height of 70 (inches), resulting in a BMI of 32.5 kg/m2. ?The patient's neck circumference measured 17 inches. ? ?CURRENT MEDICATIONS: Ventolin HFA, Aspirin EC, Lipitor, Astelin, Calcium-Vitamin D, Vitamin D, Lotrisone, Flonase, Neurontin, Claritin, Magnesium Oxide, Multivitamin, Prilosec, Probiotic, Vitamin C ?  ?PROCEDURE:  This is a multichannel digital polysomnogram utilizing the Somnostar 11.2 system.  Electrodes and  sensors were applied and monitored per AASM Specifications.   EEG, EOG, Chin and Limb EMG, were sampled at 200 Hz.  ECG, Snore and Nasal Pressure, Thermal Airflow, Respiratory Effort, CPAP Flow and Pressure, Oximetry was sampled at 50 Hz. Digital video and audio were recorded.     ? ?BASELINE STUDY: Lights Out was at 21:37 and Lights On at 05:00.  Total recording time (TRT) was 443.5 minutes, with a total sleep time (TST) of 375 minutes.    ?The patient's sleep latency was 7 minutes.  REM latency at 122.5 minutes. Sleep efficiency was 84.6 %.  ?   ?SLEEP ARCHITECTURE: WASO (Wake after sleep onset) was 61.5 minutes.  There were 11 minutes in Stage N1, 250 minutes Stage N2, 48.5 minutes Stage N3 and 65.5 minutes in Stage REM.  The percentage of Stage N1 was 2.9%, Stage N2 was 66.7%, Stage N3 was 12.9% and Stage R (REM sleep) was 17.5%.  ? ? ?RESPIRATORY ANALYSIS:  There were a total of 39 respiratory events:  0 apneas and an apnea index (AI) of 0 /hour. There were 39 hypopneas with a hypopnea index of 6.24 /hour. The patient also had no respiratory event related arousals (RERAs).    ?The total APNEA/HYPOPNEA INDEX (AHI) was 6.24/hour.  8 events occurred in REM sleep and 62 events in NREM. The REM AHI was  7.3 /hour, versus a non-REM AHI of 6.0/h.  ?The supine AHI was 120.0 ( extrapolated from only 2 minutes of supine sleep)  versus a non-supine AHI of 5.6. ? ?OXYGEN SATURATION & C02:  The Wake baseline 02 saturation was 95%, with the lowest being 88%. Time spent below 89% saturation equaled 0 minutes. ?Snoring was noted, mild to moderate, and was not continuously recorded.   ? ?The arousals were noted as: 40 were spontaneous, 0 were associated with PLMs, and 3 were associated with respiratory events. The patient had a total of 0 Periodic Limb Movements.   ? ? Audio and video analysis did not show any abnormal or unusual movements, behaviors, phonations or vocalizations.  The patient had two bathroom breaks.  ?His EKG  was in keeping with normal sinus rhythm (NSR). ? ? ?IMPRESSION: ? ?Very mild Obstructive Sleep Apnea (OSA)- only hypopneas were noted, and those increased in the last hour of recording (fatigue related?) and during REM sleep.  ?Mild - moderate Snoring. ?Many Spontaneous arousals were noted.  ? ? ? ?RECOMMENDATIONS: ? ?CPAP or any other sleep apnea therapy is optional, given the low AHI (mild apnea). The threshold to perform a SPLIT protocol was not reached. ?Consider dental device for mild apnea and snoring and continue to avoid supine sleep.  ?Trazodone may further reduce the number of spontaneous arousals.   ? ? ?I certify that I have reviewed the entire raw data recording prior to the issuance of this report in accordance with the Standards of Accreditation of the [place] [employer] (AASM) ? ? ?Larey Seat, MD ?Diplomat, American Board of Neurology ( ABPN) and Sleep Medicine(ABSM) ?Medical Director, [employer] at [facility] ? ?

## 2021-04-07 ENCOUNTER — Other Ambulatory Visit: Payer: Self-pay

## 2021-04-07 ENCOUNTER — Ambulatory Visit (INDEPENDENT_AMBULATORY_CARE_PROVIDER_SITE_OTHER): Payer: BC Managed Care – PPO | Admitting: Family Medicine

## 2021-04-07 VITALS — Wt 219.4 lb

## 2021-04-07 DIAGNOSIS — G4733 Obstructive sleep apnea (adult) (pediatric): Secondary | ICD-10-CM | POA: Diagnosis not present

## 2021-04-07 DIAGNOSIS — E7849 Other hyperlipidemia: Secondary | ICD-10-CM

## 2021-04-07 DIAGNOSIS — I480 Paroxysmal atrial fibrillation: Secondary | ICD-10-CM

## 2021-04-07 MED ORDER — ATORVASTATIN CALCIUM 20 MG PO TABS: ORAL_TABLET | ORAL | 1 refills | Status: DC

## 2021-04-07 NOTE — Progress Notes (Signed)
? ?  Subjective:  ? ? Patient ID: Phillip Mcguire, male    DOB: 1955/12/10, 66 y.o.   MRN: [id number] ? ?Hyperlipidemia ?This is a chronic problem. The current episode started more than 1 year ago. Treatments tried: lipitor 20 mg. There are no compliance problems.  Risk factors for coronary artery disease include dyslipidemia.  ?Stress wise he is doing fairly well denies being depressed ?Tries to eat healthy for the most part ?Stays active ? ?Working a lot of hours but not crazy level. ? ?Review of Systems ? ?   ?Objective:  ? Physical Exam ?General-in no acute distress ?Eyes-no discharge ?Lungs-respiratory rate normal, CTA ?CV-no murmurs,RRR ?Extremities skin warm dry no edema ?Neuro grossly normal ?Behavior normal, alert ? ? ? ? ?   ?Assessment & Plan:  ?1. OSA (obstructive sleep apnea) ?The care of neurology continue utilizing his test did not show severe sleep apnea but he states he benefits from using this ? ?2. PAF (paroxysmal atrial fibrillation) ([name]) ?Intermittent atrial fib CHADS2 score 1, currently specialist told him to take 81 mg aspirin daily ? ?3. Other hyperlipidemia ?Under good control medication.  Follow-up for wellness visits ? ? ?

## 2021-04-07 NOTE — Patient Instructions (Signed)
Please send me update in 2 weeks on light headedness ?

## 2021-04-08 ENCOUNTER — Ambulatory Visit: Payer: BC Managed Care – PPO | Admitting: Podiatry

## 2021-04-11 NOTE — Progress Notes (Signed)
We will need to rescore this study- the last hour of sleep was not correctly scored and this can increase the AHI margin.

## 2021-04-14 ENCOUNTER — Telehealth: Payer: Self-pay | Admitting: Dermatology

## 2021-04-14 MED ORDER — CLOBETASOL PROPIONATE 0.05 % EX FOAM: Freq: Two times a day (BID) | CUTANEOUS | 0 refills | Status: DC

## 2021-04-14 NOTE — Telephone Encounter (Signed)
Patient is calling to say that the ?Scalpicin Scalp Itch Relief is not working and he says that he was told that if it did not work that  Clobetasol Foam would be sent in to ?[id number] [place] [place] - [address](Ph: [phone] ? ?

## 2021-04-15 NOTE — Telephone Encounter (Signed)
Received a notification from [facility] that the patient is wanting to try a dental device option. They have cancelled the cpap order.  ?

## 2021-06-05 ENCOUNTER — Ambulatory Visit (INDEPENDENT_AMBULATORY_CARE_PROVIDER_SITE_OTHER): Payer: BC Managed Care – PPO | Admitting: Family Medicine

## 2021-06-05 ENCOUNTER — Encounter: Payer: Self-pay | Admitting: Family Medicine

## 2021-06-05 VITALS — BP 116/69 | HR 58 | Temp 97.9°F | Ht 71.0 in | Wt 216.0 lb

## 2021-06-05 DIAGNOSIS — J020 Streptococcal pharyngitis: Secondary | ICD-10-CM

## 2021-06-05 DIAGNOSIS — J029 Acute pharyngitis, unspecified: Secondary | ICD-10-CM

## 2021-06-05 LAB — POCT RAPID STREP A (OFFICE): Rapid Strep A Screen: POSITIVE — AB

## 2021-06-05 MED ORDER — AMOXICILLIN 500 MG PO CAPS: 500.0000 mg | ORAL_CAPSULE | Freq: Three times a day (TID) | ORAL | 0 refills | Status: DC

## 2021-06-05 NOTE — Progress Notes (Signed)
   Subjective:    Patient ID: Phillip Mcguire, male    DOB: 05/28/1955, 66 y.o.   MRN: [id number]  Sore Throat  This is a new problem. The current episode started yesterday. There has been no fever. Associated symptoms include congestion. Treatments tried: alka seltzer cold.   Nasal congestion , cough and chest congestion x 1 week head congestion drainage coughing also sore throat  Review of Systems  HENT:  Positive for congestion.       Objective:   Physical Exam Gen-NAD not toxic TMS-normal bilateral T- normal no redness Chest-CTA respiratory rate normal no crackles CV RRR no murmur Skin-warm dry Neuro-grossly normal        Assessment & Plan:  Strep test positive Head congestion sinus tenderness noted Antibiotic prescribed for strep throat Should gradually get better over time warnings discussed follow-up if problems

## 2021-06-12 ENCOUNTER — Telehealth: Payer: Self-pay

## 2021-06-12 NOTE — Telephone Encounter (Signed)
Returned patient's call , voicemail is not set up .

## 2021-06-12 NOTE — Telephone Encounter (Signed)
Patient states has two pills left of his abx and is coughing up clear phlegm and congestion is getting hung in his throat. No fever, sob, wheezing . No other OTC meds taken . Requested additional refill of antibiotics.

## 2021-06-12 NOTE — Telephone Encounter (Signed)
Caller name:Hodges Mallie Mussel   On DPR? :Yes  Call back number:[phone]  Provider they see: [place]    Reason for call:Pt was seen last Thursday and pt was asked to call back if he was not feeling any better is is about 80% better but not feeling 100% yet and only has 3 pills left just enough for today pt was prescribe amoxicillin (AMOXIL) 500 MG capsule  if Dr Nicki Reaper decides to send something else in pt [facility] [place]

## 2021-06-13 MED ORDER — AMOXICILLIN 500 MG PO CAPS: 500.0000 mg | ORAL_CAPSULE | Freq: Three times a day (TID) | ORAL | 0 refills | Status: AC

## 2021-06-13 NOTE — Telephone Encounter (Signed)
Prescription sent electronically to pharmacy. Patient notified and advised of provider's recommendations. Patient verbalized understanding.

## 2021-06-13 NOTE — Telephone Encounter (Signed)
May have 7 more days of antibiotics If any ongoing troubles I highly recommend a follow-up  It is not unusual to have clear phlegm and residual coughing for several weeks after a respiratory illness while the body is healing  If 6 significant purulent drainage fevers difficulty breathing ER urgent care or follow-up

## 2021-06-20 ENCOUNTER — Other Ambulatory Visit: Payer: Self-pay | Admitting: Podiatry

## 2021-06-23 ENCOUNTER — Telehealth: Payer: Self-pay | Admitting: Pulmonary Disease

## 2021-06-23 ENCOUNTER — Ambulatory Visit (INDEPENDENT_AMBULATORY_CARE_PROVIDER_SITE_OTHER): Payer: BC Managed Care – PPO | Admitting: Pulmonary Disease

## 2021-06-23 ENCOUNTER — Encounter: Payer: Self-pay | Admitting: Pulmonary Disease

## 2021-06-23 VITALS — BP 120/78 | HR 60 | Temp 98.1°F | Ht 71.0 in | Wt 216.4 lb

## 2021-06-23 DIAGNOSIS — J301 Allergic rhinitis due to pollen: Secondary | ICD-10-CM | POA: Diagnosis not present

## 2021-06-23 DIAGNOSIS — G4733 Obstructive sleep apnea (adult) (pediatric): Secondary | ICD-10-CM | POA: Diagnosis not present

## 2021-06-23 DIAGNOSIS — G4709 Other insomnia: Secondary | ICD-10-CM

## 2021-06-23 DIAGNOSIS — R0789 Other chest pain: Secondary | ICD-10-CM

## 2021-06-23 NOTE — Progress Notes (Signed)
[facility]  Chief Complaint  Patient presents with   Follow-up    Patient has not been using cpap would like to talk about oral appliance. Also states that his chest feels a little tight but thinks its due to the weather.     Past Surgical History:  He  has a past surgical history that includes otosclerosis; Nasal septum surgery (1980's); Esophagogastroduodenoscopy (01/2002); Colonoscopy (2007); titanium implant (Left, March 2010); Shoulder arthroscopy w/ rotator cuff repair (Left, 2003); Colonoscopy (N/A, 12/26/2015); and Nailbed repair (Right, 06/14/2018).  Past Medical History:  Allergies, Absent Rt kidney, GERD, Chronic otitis externa Lt ear, Neuropathy, HLD, A fib  Constitutional:  BP 120/78 (BP Location: Left Arm, Patient Position: Sitting, Cuff Size: Normal)   Pulse 60   Temp 98.1 F (36.7 C) (Oral)   Ht '5\' 11"'$  (1.803 m)   Wt 216 lb 6.4 oz (98.2 kg)   SpO2 95%   BMI 30.18 kg/m   Brief Summary:  Phillip Mcguire is a 66 y.o. male former smoker with obstructive sleep apnea and sleep maintenance insomnia.      Subjective:   He had sleep study through [facility].  This showed very mild sleep apnea.  He opted to try an oral appliance.  He hasn't followed up with his dentist yet.  Doxepin helps him sleep, but he has a hangover effect.  Using it prn now.  He has improved his sleep hygiene and this has helped.  Has been using azelastine.  This works better than flonase, but he still has some sinus congestion.  He feels tight in his chest when it is rainy weather.  He has ventolin, but hasn't used it recently.    Physical Exam:   Appearance - well kempt   ENMT - no sinus tenderness, no oral exudate, no LAN, Mallampati 3 airway, no stridor, 2+ tonsils  Respiratory - equal breath sounds bilaterally, no wheezing or rales  CV - s1s2 regular rate and rhythm, no murmurs  Ext - no clubbing, no edema  Skin - no rashes  Psych  - normal mood and affect    Pulmonary testing:  PFT 07/02/20 >> FEV1 3.58 (95%), FEV1% 82, TLC 6.96 (93%), DLCO 103%  Chest Imaging:  Cardiac CT 04/10/20 >> calcium score 0, atherosclerosis  Sleep Tests:  HST 07/23/10 >> AHI 5, SpO2 low 80% CPAP 09/23/19 to 10/22/19 >> used on 28 of 30 nights with average 5 hrs 19.  Average AHI 0.2 with CPAP 4 cm H2O. PSG 03/25/21 >> AHI 6.24, SpO2 low 88%  Cardiac Tests:  Echo 03/24/21 >> EF 60 to 65%  Social History:  He  reports that he quit smoking about 33 years ago. His smoking use included cigarettes. He has a 22.50 pack-year smoking history. He has quit using smokeless tobacco. He reports current alcohol use. He reports that he does not use drugs.  Family History:  His family history includes Allergies in his son; Cancer in his maternal aunt, maternal uncle, paternal aunt, and paternal uncle; Diabetes in his father, mother, and sister; Heart disease in his father and mother; Hyperlipidemia in his father and mother; Hypertension in his mother; Lung cancer in his father.     Assessment/Plan:   Obstructive sleep apnea. - explained that even though he has mild sleep apnea he should still consider additional therapy because of his history of atrial fibrillation - he is intolerant of CPAP - his AHI is below threshold to be  considered for an Inspire device - he will check with his dentist about whether he is a candidate for an oral appliance, and call if he needs a referral to get an oral appliance - he will need repeat sleep study after his oral appliance is fitted  Sleep maintenance insomnia. - discussed proper sleep hygiene and regular sleep wake schedule - trazodone caused palpitations - doxepin 3 mg qhs prn  Allergic rhinitis. - prn azelastine  Chest tightness. - he might have intermittent asthma - advised him to try using ventolin when he has symptoms; depending on his response and frequency of use will determine if additional assessment  of asthma is needed - demonstrated proper technique for use of ventolin  Paroxysmal atrial fibrillation. - followed by Dr. Rudean Haskell with [facility]  Time Spent Involved in Patient Care on Day of Examination:  39 minutes  Follow up:   Patient Instructions  Check with your dentist about getting an oral appliance to treat obstructive sleep apnea  Try using ventolin two puffs as needed for chest tightness, cough, chest congestion, wheezing, or shortness of breath  Follow up in 6 months  Medication List:   Allergies as of 06/23/2021       Reactions   Latex Rash        Medication List        Accurate as of June 23, 2021  9:21 AM. If you have any questions, ask your nurse or doctor.          STOP taking these medications    clobetasol 0.05 % topical foam Commonly known as: OLUX Stopped by: Chesley Mires, MD   fluticasone 50 MCG/ACT nasal spray Commonly known as: FLONASE Stopped by: Chesley Mires, MD       TAKE these medications    albuterol 108 (90 Base) MCG/ACT inhaler Commonly known as: VENTOLIN HFA Inhale 2 puffs into the lungs every 6 (six) hours as needed for wheezing or shortness of breath. Inhale 2 puffs into lungs QID prn for wheezing/cough   aspirin EC 81 MG tablet Take 1 tablet (81 mg total) by mouth daily. Swallow whole.   atorvastatin 20 MG tablet Commonly known as: LIPITOR TAKE 1 TABLET BY MOUTH EVERY DAY   azelastine 0.1 % nasal spray Commonly known as: ASTELIN Place 1 spray into both nostrils 2 (two) times daily. Use in each nostril as directed   CALCIUM-VITAMIN D PO Take 1 tablet by mouth daily. 200 units calcium, 1100units vit D   clotrimazole-betamethasone lotion Commonly known as: LOTRISONE Apply topically.   Doxepin HCl 3 MG Tabs Take 1 tablet (3 mg total) by mouth at bedtime.   gabapentin 100 MG capsule Commonly known as: NEURONTIN TAKE 1 CAPSULE BY MOUTH EVERY MORNING AND 2 CAPSULES EVERY EVENING AND 3 CAPSULES  AT BEDITME   loratadine 10 MG tablet Commonly known as: CLARITIN Take 10 mg by mouth daily.   Magnesium Oxide 200 MG Tabs Take 200 mg by mouth daily.   multivitamin capsule Take 1 capsule by mouth daily.   omeprazole 20 MG capsule Commonly known as: PRILOSEC Take 20 mg by mouth daily.   OVER THE COUNTER MEDICATION Take 1 tablet by mouth daily. Zinc   PROBIOTIC PO Take 1 Dose by mouth daily. '300mg'$    vitamin C 500 MG tablet Commonly known as: ASCORBIC ACID Take 500 mg by mouth daily.   Vitamin D 50 MCG (2000 UT) Caps Take 2,000 Units by mouth daily.  Signature:  Chesley Mires, MD [place] Pager - [phone] 06/23/2021, 9:21 AM

## 2021-06-23 NOTE — Telephone Encounter (Signed)
Pt states his dentist doesn't offer any kind of appliance for sleep apnea.  Was told to call back and let Dr. Halford Chessman know.  Please advise.    Would you like patient referred to Dr. Toy Cookey or Dr. Ron Parker?

## 2021-06-23 NOTE — Patient Instructions (Signed)
Check with your dentist about getting an oral appliance to treat obstructive sleep apnea  Try using ventolin two puffs as needed for chest tightness, cough, chest congestion, wheezing, or shortness of breath  Follow up in 6 months

## 2021-06-23 NOTE — Telephone Encounter (Signed)
Please advise 

## 2021-06-23 NOTE — Telephone Encounter (Signed)
Please put in referral to Dr. Augustina Mood.

## 2021-06-24 NOTE — Telephone Encounter (Signed)
Order has been placed for Dr. Toy Cookey for oral appliance. Called and let patient know and he expressed understanding. Nothing further needed at this time.

## 2021-06-26 ENCOUNTER — Ambulatory Visit: Payer: BC Managed Care – PPO | Admitting: Podiatry

## 2021-07-10 ENCOUNTER — Telehealth: Payer: Self-pay | Admitting: Pulmonary Disease

## 2021-07-11 NOTE — Telephone Encounter (Signed)
Called patient and he states that he had discussed sinus issues with Dr. Halford Chessman at last appt (06/23/2021). He states that his sinus issues are not getting any better and he wanted to see what the next step would be. Informed patient that Dr. Halford Chessman is out of office today but will be back Monday. Patient is okay waiting until then for a response.  Dr. Halford Chessman please advise.

## 2021-07-14 NOTE — Telephone Encounter (Signed)
ATC patient. VM was full

## 2021-07-14 NOTE — Telephone Encounter (Signed)
I had recommended that he use astepro nasal spray.  He can use this one spray in each nostril in the morning and in the evening.  He should use saline sinus rinse prior to using azelastine in the morning and in the evening.  He can try adding nasacort 1 spray in each nostril at night.  He can get astepro and nasacort over the counter without a prescription.  It looks like a referral has also been put in for ENT appointment - he should keep this appointment.

## 2021-07-14 NOTE — Telephone Encounter (Signed)
Called and notified patient of recommendations from Dr. Halford Chessman.  He voiced understanding. Nothing further needed.

## 2021-07-14 NOTE — Telephone Encounter (Signed)
Pt returning call

## 2021-07-31 ENCOUNTER — Encounter: Payer: Self-pay | Admitting: Podiatry

## 2021-07-31 ENCOUNTER — Ambulatory Visit (INDEPENDENT_AMBULATORY_CARE_PROVIDER_SITE_OTHER): Payer: BC Managed Care – PPO | Admitting: Podiatry

## 2021-07-31 DIAGNOSIS — G5781 Other specified mononeuropathies of right lower limb: Secondary | ICD-10-CM

## 2021-07-31 DIAGNOSIS — G5782 Other specified mononeuropathies of left lower limb: Secondary | ICD-10-CM

## 2021-07-31 NOTE — Progress Notes (Signed)
He presents today for follow-up of his neuroma and neuropathy forefoot bilaterally.  States that really started bothering me again and like to continue with the injections if possible.  Objective: Vital signs are stable alert oriented x3 he has pain on palpation to the second interdigital space and third interdigital space bilaterally.  Left seems to be worse than the right.  Assessment: Neuritis neuroma bilateral.  Plan: Continue dehydrated alcohol injections.

## 2021-08-18 ENCOUNTER — Other Ambulatory Visit: Payer: Self-pay | Admitting: Family Medicine

## 2021-08-18 ENCOUNTER — Ambulatory Visit
Admission: RE | Admit: 2021-08-18 | Discharge: 2021-08-18 | Disposition: A | Discharge status: Home / Self Care | Payer: No Typology Code available for payment source | Source: Ambulatory Visit | Attending: Family Medicine | Admitting: Family Medicine

## 2021-08-18 DIAGNOSIS — T1490XA Injury, unspecified, initial encounter: Secondary | ICD-10-CM

## 2021-09-16 NOTE — Progress Notes (Unsigned)
Office Visit    Patient Name: Phillip Mcguire Date of Encounter: 09/17/2021  Primary Care Provider:  Kathyrn Drown, MD Primary Cardiologist:  Phillip Lean, MD Primary Electrophysiologist: None  Chief Complaint    Phillip Mcguire is a 66 y.o. male with PMH of HLD, GERD, atrial fibrillation, thoracic aortic aneurysm, sleep apnea who presents today for 32-monthfollow-up of atrial fibrillation and thoracic aortic aneurysm.  Past Medical History    Past Medical History:  Diagnosis Date   Abnormal LFTs    mild elevation of total, indirect and direct bilirubin   Allergy    Congenital absence of kidney    Right   GERD (gastroesophageal reflux disease)    Hearing loss    Sleep apnea    Past Surgical History:  Procedure Laterality Date   COLONOSCOPY  2007   [place]: normal   COLONOSCOPY N/A 12/26/2015   Procedure: COLONOSCOPY;  Surgeon: RDaneil Dolin MD;  [facility];  Service: Endoscopy;  Laterality: N/A;  2:00 PM   ESOPHAGOGASTRODUODENOSCOPY  01/2002   Dr. KDeatra Ina Normal   NAILBED REPAIR Right 06/14/2018   Procedure: RECONSTRUCTION NAILBED WITH PALMARIS GRAFT RIGHT THUMB;  Surgeon: KDaryll Brod MD;  [place] [facility] [facility]  Service: Orthopedics;  Laterality: Right;  AXILLARY BLOCK   NASAL SEPTUM SURGERY  1980's   otosclerosis     bilateral ear surgery   SHOULDER ARTHROSCOPY W/ ROTATOR CUFF REPAIR Left 2003   titanium implant Left March 2010   left middle ear    Allergies  Allergies  Allergen Reactions   Latex Rash    History of Present Illness    Phillip YURKOVICHis a 66year old male with the above-mentioned past medical history who presents today for follow-up of atrial fibrillation and thoracic aortic aneurysm.  Mr. BBueningwas recently seen by Phillip Mcguire in 2017 when he presented for complaint of chest pain.  Patient had a GXT on 10/2015 that showed normal blood pressure response and low risk Duke treadmill score.  He had 2D echo  completed 10/2015 that showed normal systolic function with EF of 55-60% and no RWMA with mild MV regurgitation.  He was lost to follow-up until 2022 when he presented with complaint of dyspnea on exertion.  He underwent CCTA that revealed calcium score of 0 with minimal nonobstructive CAD.  [name] today alone for his annual follow-up.  Since last being seen in the office patient reports he has been doing well with no cardiac complaints.  He does report 2 isolated episodes of rapid heart rate that occurred during the evening hours.  He denies any dietary indiscretions with caffeine.  Patient denies chest pain, palpitations, dyspnea, PND, orthopnea, nausea, vomiting, dizziness, syncope, edema, weight gain, or early satiety.   Home Medications    Current Outpatient Medications  Medication Sig Dispense Refill   albuterol (VENTOLIN HFA) 108 (90 Base) MCG/ACT inhaler Inhale 2 puffs into the lungs every 6 (six) hours as needed for wheezing or shortness of breath. Inhale 2 puffs into lungs QID prn for wheezing/cough 18 g 3   aspirin EC 81 MG tablet Take 1 tablet (81 mg total) by mouth daily. Swallow whole. 90 tablet 3   atorvastatin (LIPITOR) 20 MG tablet TAKE 1 TABLET BY MOUTH EVERY DAY 90 tablet 1   azelastine (ASTELIN) 0.1 % nasal spray Place 1 spray into both nostrils 2 (two) times daily. Use in each nostril as directed 30 mL 12  CALCIUM-VITAMIN D PO Take 1 tablet by mouth daily. 200 units calcium, 1100units vit D     Cholecalciferol (VITAMIN D) 2000 units CAPS Take 2,000 Units by mouth daily.     clotrimazole-betamethasone (LOTRISONE) lotion Apply topically.     Doxepin HCl 3 MG TABS Take 1 tablet (3 mg total) by mouth at bedtime. 30 tablet 5   fluticasone (FLONASE) 50 MCG/ACT nasal spray Place 1 spray into both nostrils daily.     gabapentin (NEURONTIN) 100 MG capsule TAKE 1 CAPSULE BY MOUTH EVERY MORNING AND 2 CAPSULES EVERY EVENING AND 3 CAPSULES AT BEDITME 540 capsule 0    loratadine (CLARITIN) 10 MG tablet Take 10 mg by mouth daily.     Magnesium Oxide 200 MG TABS Take 200 mg by mouth daily.     Multiple Vitamin (MULTIVITAMIN) capsule Take 1 capsule by mouth daily.     omeprazole (PRILOSEC) 20 MG capsule Take 20 mg by mouth daily.      OVER THE COUNTER MEDICATION Take 1 tablet by mouth daily. Zinc     Probiotic Product (PROBIOTIC PO) Take 1 Dose by mouth daily. '300mg'$      vitamin C (ASCORBIC ACID) 500 MG tablet Take 500 mg by mouth daily.     Current Facility-Administered Medications  Medication Dose Route Frequency Provider Last Rate Last Admin   triamcinolone acetonide (KENALOG-40) injection 20 mg  20 mg Other Once [place], Max T, DPM         Review of Systems  Please see the history of present illness.     All other systems reviewed and are otherwise negative except as noted above.  Physical Exam    Wt Readings from Last 3 Encounters:  09/17/21 212 lb (96.2 kg)  06/23/21 216 lb 6.4 oz (98.2 kg)  06/05/21 216 lb (98 kg)   VS: Vitals:   09/17/21 1505  BP: 110/68  Pulse: 62  SpO2: 96%  ,Body mass index is 29.57 kg/m.  Constitutional:      Appearance: Healthy appearance. Not in distress.  Neck:     Vascular: JVD normal.  Pulmonary:     Effort: Pulmonary effort is normal.     Breath sounds: No wheezing. No rales. Diminished in the bases Cardiovascular:     Normal rate. Regular rhythm. Normal S1. Normal S2.      Murmurs: There is no murmur.  Edema:    Peripheral edema absent.  Abdominal:     Palpations: Abdomen is soft non tender. There is no hepatomegaly.  Skin:    General: Skin is warm and dry.  Neurological:     General: No focal deficit present.     Mental Status: Alert and oriented to person, place and time.     Cranial Nerves: Cranial nerves are intact.  EKG/LABS/Other Studies Reviewed    ECG personally reviewed by me today -sinus rhythm with rate of 62 and no acute changes  Risk Assessment/Calculations:    CHA2DS2-VASc  Score = 1   This indicates a 0.6% annual risk of stroke. The patient's score is based upon: CHF History: 0 HTN History: 0 Diabetes History: 0 Stroke History: 0 Vascular Disease History: 0 Age Score: 1 Gender Score: 0    Lab Results  Component Value Date   WBC 7.8 12/15/2019   HGB 15.7 12/15/2019   HCT 46.7 12/15/2019   MCV 89.1 12/15/2019   PLT 245 12/15/2019   Lab Results  Component Value Date   CREATININE 1.03 10/07/2020   BUN 18  10/07/2020   NA 141 10/07/2020   K 4.6 10/07/2020   CL 101 10/07/2020   CO2 26 10/07/2020   Lab Results  Component Value Date   ALT 19 03/17/2021   AST 19 10/07/2020   ALKPHOS 95 10/07/2020   BILITOT 1.5 (H) 10/07/2020   Lab Results  Component Value Date   CHOL 125 03/17/2021   HDL 47 03/17/2021   LDLCALC 64 03/17/2021   TRIG 64 03/17/2021   CHOLHDL 2.7 03/17/2021    Lab Results  Component Value Date   HGBA1C 5.7 04/02/2017    Assessment & Plan    1.  Aortic atherosclerosis: -Atherosclerotic calcifications in the thoracic aorta -Patient currently on GDMT with ASA 81 mg and Lipitor 20 mg  2.  Paroxysmal atrial fibrillation: -Today patient is sinus rhythm with rate of 67 -He does report occasional palpitations in the evenings. -We will have him wear a 14-day ZIO monitor to evaluate palpitations and possible AF burden  3.  Nonobstructive coronary artery disease: -Today patient states no chest pain or anginal equivalents -Coronary CTA completed with calcium score of 0 -Continue ASA and atorvastatin  4.  Hyperlipidemia: -Last LDL was 64 at goal of less than 70 -Continue atorvastatin as noted above  Disposition: Follow-up with Phillip Lean, MD or [facility] in 12 months    Medication Adjustments/Labs and Tests Ordered: Current medicines are reviewed at length with the patient today.  Concerns regarding medicines are outlined above.   Signed, Mable Fill, Marissa Nestle, NP 09/17/2021, 3:32 PM [place]

## 2021-09-17 ENCOUNTER — Ambulatory Visit (INDEPENDENT_AMBULATORY_CARE_PROVIDER_SITE_OTHER): Payer: BC Managed Care – PPO

## 2021-09-17 ENCOUNTER — Ambulatory Visit: Payer: BC Managed Care – PPO | Attending: Nurse Practitioner | Admitting: Nurse Practitioner

## 2021-09-17 ENCOUNTER — Encounter: Payer: Self-pay | Admitting: Nurse Practitioner

## 2021-09-17 VITALS — BP 110/68 | HR 62 | Ht 71.0 in | Wt 212.0 lb

## 2021-09-17 DIAGNOSIS — E7849 Other hyperlipidemia: Secondary | ICD-10-CM

## 2021-09-17 DIAGNOSIS — I480 Paroxysmal atrial fibrillation: Secondary | ICD-10-CM | POA: Diagnosis not present

## 2021-09-17 DIAGNOSIS — I700 Atherosclerosis of aorta: Secondary | ICD-10-CM | POA: Diagnosis not present

## 2021-09-17 NOTE — Progress Notes (Unsigned)
Enrolled for [employer] to mail a ZIO XT long term holter monitor to the patients address on file.   Dr. Chandrasekhar to read. 

## 2021-09-17 NOTE — Patient Instructions (Signed)
Medication Instructions:  Your physician recommends that you continue on your current medications as directed. Please refer to the Current Medication list given to you today.  *If you need a refill on your cardiac medications before your next appointment, please call your pharmacy*  Testing/Procedures: Your physician has recommended that you wear an event monitor. Event monitors are medical devices that record the heart's electrical activity. Doctors most often [place] these monitors to diagnose arrhythmias. Arrhythmias are problems with the speed or rhythm of the heartbeat. The monitor is a small, portable device. You can wear one while you do your normal daily activities. This is usually used to diagnose what is causing palpitations/syncope (passing out).    Follow-Up: At [facility], you and your health needs are our priority.  As part of our continuing mission to provide you with exceptional heart care, we have created designated Provider Care Teams.  These Care Teams include your primary Cardiologist (physician) and Advanced Practice Providers (APPs -  Physician Assistants and Nurse Practitioners) who all work together to provide you with the care you need, when you need it.  Your next appointment:   1 year(s)  The format for your next appointment:   In Person  Provider:   Ambrose Pancoast, NP        Other Instructions [place] Monitor Instructions  Your physician has requested you wear a ZIO patch monitor for 14 days.  This is a single patch monitor. [employer] supplies one patch monitor per enrollment. Additional stickers are not available. Please do not apply patch if you will be having a Nuclear Stress Test,  Echocardiogram, Cardiac CT, MRI, or Chest Xray during the period you would be wearing the  monitor. The patch cannot be worn during these tests. You cannot remove and re-apply the  ZIO XT patch monitor.  Your ZIO patch monitor will be mailed 3 day USPS to your  address on file. It may take 3-5 days  to receive your monitor after you have been enrolled.  Once you have received your monitor, please review the enclosed instructions. Your monitor  has already been registered assigning a specific monitor serial # to you.  Billing and Patient Assistance Program Information  We have supplied [employer] with any of your insurance information on file for billing purposes. [employer] offers a sliding scale Patient Assistance Program for patients that do not have  insurance, or whose insurance does not completely cover the cost of the ZIO monitor.  You must apply for the Patient Assistance Program to qualify for this discounted rate.  To apply, please call [employer] at [phone], select option 4, select option 2, ask to apply for  Patient Assistance Program. Theodore Demark will ask your household income, and how many people  are in your household. They will quote your out-of-pocket cost based on that information.  [employer] will also be able to set up a 67-month interest-free payment plan if needed.  Applying the monitor   Shave hair from upper left chest.  Hold abrader disc by orange tab. Rub abrader in 40 strokes over the upper left chest as  indicated in your monitor instructions.  Clean area with 4 enclosed alcohol pads. Let dry.  Apply patch as indicated in monitor instructions. Patch will be placed under collarbone on left  side of chest with arrow pointing upward.  Rub patch adhesive wings for 2 minutes. Remove white label marked "1". Remove the white  label marked "2". Rub patch adhesive wings for  2 additional minutes.  While looking in a mirror, press and release button in center of patch. A small green light will  flash 3-4 times. This will be your only indicator that the monitor has been turned on.  Do not shower for the first 24 hours. You may shower after the first 24 hours.  Press the button if you feel a symptom. You will hear a small click. Record  Date, Time and  Symptom in the Patient Logbook.  When you are ready to remove the patch, follow instructions on the last 2 pages of Patient  Logbook. Stick patch monitor onto the last page of Patient Logbook.  Place Patient Logbook in the blue and white box. Use locking tab on box and tape box closed  securely. The blue and white box has prepaid postage on it. Please place it in the mailbox as  soon as possible. Your physician should have your test results approximately 7 days after the  monitor has been mailed back to [facility].  Call [place] at [phone] if you have questions regarding  your ZIO XT patch monitor. Call them immediately if you see an orange light blinking on your  monitor.  If your monitor falls off in less than 4 days, contact our Monitor department at [phone].  If your monitor becomes loose or falls off after 4 days call [employer] at [phone] for  suggestions on securing your monitor    Stroke Prevention Some medical conditions and behaviors can lead to a higher chance of having a stroke. You can help prevent a stroke by eating healthy, exercising, not smoking, and managing any medical conditions you have. Stroke is a leading cause of functional impairment. Primary prevention is particularly important because a majority of strokes are first-time events. Stroke changes the lives of not only those who experience a stroke but also their family and other caregivers. How can this condition affect me? A stroke is a medical emergency and should be treated right away. A stroke can lead to brain damage and can sometimes be life-threatening. If a person gets medical treatment right away, there is a better chance of surviving and recovering from a stroke. What can increase my risk? The following medical conditions may increase your risk of a stroke: Cardiovascular disease. High blood pressure (hypertension). Diabetes. High cholesterol. Sickle  cell disease. Blood clotting disorders (hypercoagulable state). Obesity. Sleep disorders (obstructive sleep apnea). Other risk factors include: Being older than age 31. Having a history of blood clots, stroke, or mini-stroke (transient ischemic attack, TIA). Genetic factors, such as race, ethnicity, or a family history of stroke. Smoking cigarettes or using other tobacco products. Taking birth control pills, especially if you also use tobacco. Heavy use of alcohol or drugs, especially cocaine and methamphetamine. Physical inactivity. What actions can I take to prevent this? Manage your health conditions High cholesterol levels. Eating a healthy diet is important for preventing high cholesterol. If cholesterol cannot be managed through diet alone, you may need to take medicines. Take any prescribed medicines to control your cholesterol as told by your health care provider. Hypertension. To reduce your risk of stroke, try to keep your blood pressure below 130/80. Eating a healthy diet and exercising regularly are important for controlling blood pressure. If these steps are not enough to manage your blood pressure, you may need to take medicines. Take any prescribed medicines to control hypertension as told by your health care provider. Ask your health care provider if you should  monitor your blood pressure at home. Have your blood pressure checked every year, even if your blood pressure is normal. Blood pressure increases with age and some medical conditions. Diabetes. Eating a healthy diet and exercising regularly are important parts of managing your blood sugar (glucose). If your blood sugar cannot be managed through diet and exercise, you may need to take medicines. Take any prescribed medicines to control your diabetes as told by your health care provider. Get evaluated for obstructive sleep apnea. Talk to your health care provider about getting a sleep evaluation if you snore a lot or  have excessive sleepiness. Make sure that any other medical conditions you have, such as atrial fibrillation or atherosclerosis, are managed. Nutrition Follow instructions from your health care provider about what to eat or drink to help manage your health condition. These instructions may include: Reducing your daily calorie intake. Limiting how much salt (sodium) you use to 1,500 milligrams (mg) each day. Using only healthy fats for cooking, such as olive oil, canola oil, or sunflower oil. Eating healthy foods. You can do this by: Choosing foods that are high in fiber, such as whole grains, and fresh fruits and vegetables. Eating at least 5 servings of fruits and vegetables a day. Try to fill one-half of your plate with fruits and vegetables at each meal. Choosing lean protein foods, such as lean cuts of meat, poultry without skin, fish, tofu, beans, and nuts. Eating low-fat dairy products. Avoiding foods that are high in sodium. This can help lower blood pressure. Avoiding foods that have saturated fat, trans fat, and cholesterol. This can help prevent high cholesterol. Avoiding processed and prepared foods. Counting your daily carbohydrate intake.  Lifestyle If you drink alcohol: Limit how much you have to: 0-1 drink a day for women who are not pregnant. 0-2 drinks a day for men. Know how much alcohol is in your drink. In the [place] one drink equals one 12 oz bottle of beer (311m), one 5 oz glass of wine (1472m, or one 1 oz glass of hard liquor (446m Do not use any products that contain nicotine or tobacco. These products include cigarettes, chewing tobacco, and vaping devices, such as e-cigarettes. If you need help quitting, ask your health care provider. Avoid secondhand smoke. Do not use drugs. Activity  Try to stay at a healthy weight. Get at least 30 minutes of exercise on most days, such as: Fast walking. Biking. Swimming. Medicines Take over-the-counter and  prescription medicines only as told by your health care provider. Aspirin or blood thinners (antiplatelets or anticoagulants) may be recommended to reduce your risk of forming blood clots that can lead to stroke. Avoid taking birth control pills. Talk to your health care provider about the risks of taking birth control pills if: You are over 35 46ars old. You smoke. You get very bad headaches. You have had a blood clot. Where to find more information American Stroke Association: www.strokeassociation.org Get help right away if: You or a loved one has any symptoms of a stroke. "BE FAST" is an easy way to remember the main warning signs of a stroke: B - Balance. Signs are dizziness, sudden trouble walking, or loss of balance. E - Eyes. Signs are trouble seeing or a sudden change in vision. F - Face. Signs are sudden weakness or numbness of the face, or the face or eyelid drooping on one side. A - Arms. Signs are weakness or numbness in an arm. This happens suddenly and usually on one  side of the body. S - Speech. Signs are sudden trouble speaking, slurred speech, or trouble understanding what people say. T - Time. Time to call emergency services. Write down what time symptoms started. You or a loved one has other signs of a stroke, such as: A sudden, severe headache with no known cause. Nausea or vomiting. Seizure. These symptoms may represent a serious problem that is an emergency. Do not wait to see if the symptoms will go away. Get medical help right away. Call your local emergency services (911 in the [place] Do not drive yourself to the hospital. Summary You can help to prevent a stroke by eating healthy, exercising, not smoking, limiting alcohol intake, and managing any medical conditions you may have. Do not use any products that contain nicotine or tobacco. These include cigarettes, chewing tobacco, and vaping devices, such as e-cigarettes. If you need help quitting, ask your health care  provider. Remember "BE FAST" for warning signs of a stroke. Get help right away if you or a loved one has any of these signs. This information is not intended to replace advice given to you by your health care provider. Make sure you discuss any questions you have with your health care provider. Document Revised: 07/31/2019 Document Reviewed: 07/31/2019 Elsevier Patient Education  [place].

## 2021-09-21 DIAGNOSIS — I480 Paroxysmal atrial fibrillation: Secondary | ICD-10-CM | POA: Diagnosis not present

## 2021-10-02 ENCOUNTER — Other Ambulatory Visit: Payer: Self-pay | Admitting: *Deleted

## 2021-10-02 DIAGNOSIS — Z79899 Other long term (current) drug therapy: Secondary | ICD-10-CM

## 2021-10-02 DIAGNOSIS — Z125 Encounter for screening for malignant neoplasm of prostate: Secondary | ICD-10-CM

## 2021-10-02 DIAGNOSIS — E7849 Other hyperlipidemia: Secondary | ICD-10-CM

## 2021-10-03 LAB — CMP14+EGFR
ALT: 25 IU/L (ref 0–44)
AST: 22 IU/L (ref 0–40)
Albumin/Globulin Ratio: 1.9 (ref 1.2–2.2)
Albumin: 4.6 g/dL (ref 3.9–4.9)
Alkaline Phosphatase: 90 IU/L (ref 44–121)
BUN/Creatinine Ratio: 15 (ref 10–24)
BUN: 16 mg/dL (ref 8–27)
Bilirubin Total: 1.8 mg/dL — ABNORMAL HIGH (ref 0.0–1.2)
CO2: 25 mmol/L (ref 20–29)
Calcium: 10 mg/dL (ref 8.6–10.2)
Chloride: 98 mmol/L (ref 96–106)
Creatinine, Ser: 1.05 mg/dL (ref 0.76–1.27)
Globulin, Total: 2.4 g/dL (ref 1.5–4.5)
Glucose: 100 mg/dL — ABNORMAL HIGH (ref 70–99)
Potassium: 4.2 mmol/L (ref 3.5–5.2)
Sodium: 139 mmol/L (ref 134–144)
Total Protein: 7 g/dL (ref 6.0–8.5)
eGFR: 78 mL/min/{1.73_m2} (ref >59)

## 2021-10-03 LAB — LIPID PANEL
Chol/HDL Ratio: 2.5 ratio (ref 0.0–5.0)
Cholesterol, Total: 127 mg/dL (ref 100–199)
HDL: 51 mg/dL (ref >39)
LDL Chol Calc (NIH): 63 mg/dL (ref 0–99)
Triglycerides: 61 mg/dL (ref 0–149)
VLDL Cholesterol Cal: 13 mg/dL (ref 5–40)

## 2021-10-03 LAB — PSA: Prostate Specific Ag, Serum: 2.1 ng/mL (ref 0.0–4.0)

## 2021-10-08 ENCOUNTER — Ambulatory Visit (INDEPENDENT_AMBULATORY_CARE_PROVIDER_SITE_OTHER): Payer: BC Managed Care – PPO | Admitting: Family Medicine

## 2021-10-08 ENCOUNTER — Encounter: Payer: Self-pay | Admitting: Family Medicine

## 2021-10-08 VITALS — BP 136/88 | Ht 70.0 in | Wt 210.0 lb

## 2021-10-08 DIAGNOSIS — Z0000 Encounter for general adult medical examination without abnormal findings: Secondary | ICD-10-CM

## 2021-10-08 DIAGNOSIS — L918 Other hypertrophic disorders of the skin: Secondary | ICD-10-CM | POA: Diagnosis not present

## 2021-10-08 DIAGNOSIS — E785 Hyperlipidemia, unspecified: Secondary | ICD-10-CM
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
## 2021-10-13 ENCOUNTER — Telehealth: Payer: Self-pay

## 2021-10-13 NOTE — Telephone Encounter (Signed)
The patient has been notified of the result and verbalized understanding.  All questions (if any) were answered. Tor Netters, RN 10/13/2021 1:24 PM

## 2021-10-28 ENCOUNTER — Telehealth: Payer: Self-pay | Admitting: Internal Medicine

## 2021-10-28 NOTE — Telephone Encounter (Signed)
   Name: Phillip Mcguire  DOB: 1955-03-20  MRN: [id number]   Primary Cardiologist: Werner Lean, MD  Chart reviewed as part of pre-operative protocol coverage. Patient was contacted 10/28/2021 in reference to pre-operative risk assessment for pending surgery as outlined below.  Phillip Mcguire was last seen on 09/17/2021 by Ambrose Pancoast, NP.  Since that day, Phillip Mcguire has done well from a cardiac standpoint.  He denies any new symptoms or concerns.  He is able to complete greater than 4 METS without difficulty.  Therefore, based on ACC/AHA guidelines, the patient would be at acceptable risk for the planned procedure without further cardiovascular testing.   The patient was advised that if he develops new symptoms prior to surgery to contact our office to arrange for a follow-up visit, and he verbalized understanding.  Regarding ASA therapy, we recommend continuation of ASA throughout the perioperative period.  However, if the surgeon feels that cessation of ASA is required in the perioperative period, it may be stopped 5-7 days prior to surgery with a plan to resume it as soon as felt to be feasible from a surgical standpoint in the post-operative period.  I will route this recommendation to the requesting party via Epic fax function and remove from pre-op pool. Please call with questions.  Lenna Sciara, NP 10/28/2021, 11:00 AM

## 2021-10-28 NOTE — Telephone Encounter (Signed)
   [facility] Pre-operative Risk Assessment    Request for surgical clearance:  What type of surgery is being performed?  Right Shoulder Arthroscopy   When is this surgery scheduled?  TBD, ASAP  What type of clearance is required (medical clearance vs. Pharmacy clearance to hold med vs. Both)?  Both  Are there any medications that need to be held prior to surgery and how long? Their office is requesting our recommendation regarding medications.   Practice name and name of physician performing surgery?  [facility]  Dr. Tamera Punt  What is your office phone number? [phone]    7.   What is your office fax number? [phone]  8.   Anesthesia type (None, local, MAC, general) ?  Choice   Phillip Mcguire 10/28/2021, 10:46 AM  _________________________________________________________________   (provider comments below)

## 2022-01-21 ENCOUNTER — Encounter: Payer: Self-pay | Admitting: Pulmonary Disease

## 2022-01-21 ENCOUNTER — Ambulatory Visit (INDEPENDENT_AMBULATORY_CARE_PROVIDER_SITE_OTHER): Payer: BC Managed Care – PPO | Admitting: Pulmonary Disease

## 2022-01-21 VITALS — BP 134/78 | HR 96 | Temp 98.4°F | Ht 70.0 in | Wt 221.8 lb

## 2022-01-21 DIAGNOSIS — G4709 Other insomnia: Secondary | ICD-10-CM | POA: Diagnosis not present

## 2022-01-21 DIAGNOSIS — G4733 Obstructive sleep apnea (adult) (pediatric): Secondary | ICD-10-CM

## 2022-01-21 DIAGNOSIS — R0789 Other chest pain: Secondary | ICD-10-CM | POA: Diagnosis not present

## 2022-01-21 NOTE — Patient Instructions (Signed)
Check with Dr. Toy Cookey about when you should get a follow up sleep study done  Follow up in 6 months

## 2022-01-21 NOTE — Progress Notes (Signed)
[facility]  Chief Complaint  Patient presents with   Follow-up    Chest tightness/ breathing doing well.  Pt had reaction to either RSV or Shingles vaccine this week but is doing ok today.     Past Surgical History:  He  has a past surgical history that includes otosclerosis; Nasal septum surgery (1980's); Esophagogastroduodenoscopy (01/2002); Colonoscopy (2007); titanium implant (Left, March 2010); Shoulder arthroscopy w/ rotator cuff repair (Left, 2003); Colonoscopy (N/A, 12/26/2015); and Nailbed repair (Right, 06/14/2018).  Past Medical History:  Allergies, Absent Rt kidney, GERD, Chronic otitis externa Lt ear, Neuropathy, HLD, A fib  Constitutional:  BP 134/78   Pulse 96   Temp 98.4 F (36.9 C)   Ht '5\' 10"'$  (1.778 m)   Wt 221 lb 12.8 oz (100.6 kg)   SpO2 97% Comment: room air  BMI 31.82 kg/m   Brief Summary:  Phillip Mcguire is a 67 y.o. male former smoker with obstructive sleep apnea and sleep maintenance insomnia.      Subjective:   He had oral appliance made with Dr. Toy Cookey.  He has been using this.  His sleep is some better.  No issues with using oral appliance.  He still wakes up during the course of the night.  He stopped doxepin because it didn't make a difference.  He wakes up for a few minutes and then goes back to sleep.  He feels okay otherwise during the day.  He is not having chest tightness anymore and not using any inhalers.  Physical Exam:   Appearance - well kempt   ENMT - no sinus tenderness, no oral exudate, no LAN, Mallampati 3 airway, no stridor  Respiratory - equal breath sounds bilaterally, no wheezing or rales  CV - s1s2 regular rate and rhythm, no murmurs  Ext - no clubbing, no edema  Skin - no rashes  Psych - normal mood and affect    Pulmonary testing:  PFT 07/02/20 >> FEV1 3.58 (95%), FEV1% 82, TLC 6.96 (93%), DLCO 103%  Chest Imaging:  Cardiac CT 04/10/20 >> calcium score 0,  atherosclerosis  Sleep Tests:  HST 07/23/10 >> AHI 5, SpO2 low 80% CPAP 09/23/19 to 10/22/19 >> used on 28 of 30 nights with average 5 hrs 19.  Average AHI 0.2 with CPAP 4 cm H2O. PSG 03/25/21 >> AHI 6.24, SpO2 low 88%  Cardiac Tests:  Echo 03/24/21 >> EF 60 to 65%  Social History:  He  reports that he quit smoking about 34 years ago. His smoking use included cigarettes. He has a 22.50 pack-year smoking history. He has quit using smokeless tobacco. He reports current alcohol use. He reports that he does not use drugs.  Family History:  His family history includes Allergies in his son; Cancer in his maternal aunt, maternal uncle, paternal aunt, and paternal uncle; Diabetes in his father, mother, and sister; Heart disease in his father and mother; Hyperlipidemia in his father and mother; Hypertension in his mother; Lung cancer in his father.     Assessment/Plan:   Obstructive sleep apnea. - he is intolerant of CPAP - he got oral appliance fitted with Dr. Augustina Mood - he will check Dr. Toy Cookey when he is due for a follow up sleep study with his oral appliance  Sleep maintenance insomnia. - on further question his sleep pattern is actually normal - discussed setting proper expectations from sleep - proper sleep hygiene reviewed - he doesn't need any sleep aide medications at  this time  Allergic rhinitis. - prn azelastine, flonase  Chest tightness. - resolved  Paroxysmal atrial fibrillation. - followed by Dr. Rudean Haskell with [facility]  Time Spent Involved in Patient Care on Day of Examination:  37 minutes  Follow up:   Patient Instructions  Check with Dr. Toy Cookey about when you should get a follow up sleep study done  Follow up in 6 months  Medication List:   Allergies as of 01/21/2022       Reactions   Latex Rash        Medication List        Accurate as of January 21, 2022 11:43 AM. If you have any questions, ask your nurse or doctor.           STOP taking these medications    albuterol 108 (90 Base) MCG/ACT inhaler Commonly known as: VENTOLIN HFA Stopped by: Chesley Mires, MD       TAKE these medications    ascorbic acid 500 MG tablet Commonly known as: VITAMIN C Take 500 mg by mouth daily.   aspirin EC 81 MG tablet Take 1 tablet (81 mg total) by mouth daily. Swallow whole.   atorvastatin 20 MG tablet Commonly known as: LIPITOR TAKE 1 TABLET BY MOUTH EVERY DAY   azelastine 0.1 % nasal spray Commonly known as: ASTELIN Place 1 spray into both nostrils 2 (two) times daily. Use in each nostril as directed   CALCIUM-VITAMIN D PO Take 1 tablet by mouth daily. 200 units calcium, 1100units vit D   clotrimazole-betamethasone lotion Commonly known as: LOTRISONE Apply topically.   fluticasone 50 MCG/ACT nasal spray Commonly known as: FLONASE Place 1 spray into both nostrils daily.   gabapentin 100 MG capsule Commonly known as: NEURONTIN TAKE 1 CAPSULE BY MOUTH EVERY MORNING AND 2 CAPSULES EVERY EVENING AND 3 CAPSULES AT BEDITME   loratadine 10 MG tablet Commonly known as: CLARITIN Take 10 mg by mouth daily.   Magnesium Oxide -Mg Supplement 200 MG Tabs Take 200 mg by mouth daily.   multivitamin capsule Take 1 capsule by mouth daily.   omeprazole 20 MG capsule Commonly known as: PRILOSEC Take 20 mg by mouth daily.   OVER THE COUNTER MEDICATION Take 1 tablet by mouth daily. Zinc   PROBIOTIC PO Take 1 Dose by mouth daily. '300mg'$    Vitamin D 50 MCG (2000 UT) Caps Take 2,000 Units by mouth daily.        Signature:  Chesley Mires, MD [facility] - [phone] 01/21/2022, 11:43 AM

## 2022-02-19 ENCOUNTER — Encounter (HOSPITAL_COMMUNITY): Payer: Self-pay | Admitting: *Deleted

## 2022-02-25 ENCOUNTER — Other Ambulatory Visit: Payer: Self-pay | Admitting: Family Medicine

## 2022-03-18 NOTE — Progress Notes (Unsigned)
Office Visit    Patient Name: Phillip Mcguire Date of Encounter: 03/19/2022  Primary Care Provider:  Kathyrn Drown, MD Primary Cardiologist:  Werner Lean, MD Primary Electrophysiologist: None  Chief Complaint    Phillip Mcguire is a 67 y.o. male with PMH of HLD, GERD, atrial fibrillation, thoracic aortic aneurysm, sleep apnea who presents today for 35-monthfollow-up of atrial fibrillation and thoracic aortic aneurysm.   Past Medical History    Past Medical History:  Diagnosis Date   Abnormal LFTs    mild elevation of total, indirect and direct bilirubin   Allergy    Congenital absence of kidney    Right   GERD (gastroesophageal reflux disease)    Hearing loss    Sleep apnea    Past Surgical History:  Procedure Laterality Date   COLONOSCOPY  2007   Rourk: normal   COLONOSCOPY N/A 12/26/2015   Procedure: COLONOSCOPY;  Surgeon: RDaneil Dolin MD;  [facility];  Service: Endoscopy;  Laterality: N/A;  2:00 PM   ESOPHAGOGASTRODUODENOSCOPY  01/2002   Dr. KDeatra Ina Normal   NAILBED REPAIR Right 06/14/2018   Procedure: RECONSTRUCTION NAILBED WITH PALMARIS GRAFT RIGHT THUMB;  Surgeon: KDaryll Brod MD;  [facility]  Service: Orthopedics;  Laterality: Right;  AXILLARY BLOCK   NASAL SEPTUM SURGERY  1980's   otosclerosis     bilateral ear surgery   SHOULDER ARTHROSCOPY W/ ROTATOR CUFF REPAIR Left 2003   titanium implant Left March 2010   left middle ear    Allergies  Allergies  Allergen Reactions   Latex Rash    History of Present Illness    Phillip Mcguire is a 67year old male with the above mention past medical history who presents today for 678-monthollow-up.r. BaQuintilianias initially seen by Dr.Koneswarhan in 2017 when he presented for complaint of chest pain. Patient had a GXT on 10/2015 that showed normal blood pressure response and low risk Duke treadmill score.  He had 2D Phillip completed 10/2015 that showed normal systolic  function with EF of 55-60% and no RWMA with mild MV regurgitation. He was lost to follow-up until 2022 when he presented with complaint of dyspnea on exertion.  He underwent CCTA that revealed calcium score of 0 with minimal nonobstructive CAD.  He was seen in follow-up 09/17/2021 and was doing well from a cardiac perspective.  He endorsed some occasional palpitations that occur in the evening and was given 14-day ZIO monitor to evaluate AF burden.  Event monitor showed underlying rhythm of sinus with occasional isolated PACs and PVCs.  There were no malignant arrhythmias noted.  Mr. BaFriedrichresents today for 6-39-monthllow-up.  He reports increased shortness of breath with occasional episodes of dizziness.  He denies any associated palpitations or chest discomfort with these episodes.  He reports that his dizziness occurs when climbing flights of stairs and also occasionally occurs when standing up quickly.  He also reports some chest discomfort and tightness with dizziness.  He is compliant with his current medications and denies any adverse reactions.  His blood pressure today is well-controlled at 108/70.  He reports that he does not get 64 ounces of water a day and he is working on increasing this.  He was encouraged to increase his physical activity and also work on his hydration.  He had rotator cuff surgery and has not been very active since then.  He is also eating more fast food  than usual since his rotator cuff surgery.  He denies any discomfort today.  Patient denies chest pain, palpitations, dyspnea, PND, orthopnea, nausea, vomiting, dizziness, syncope, edema, weight gain, or early satiety. Home Medications    Current Outpatient Medications  Medication Sig Dispense Refill   aspirin EC 81 MG tablet Take 1 tablet (81 mg total) by mouth daily. Swallow whole. 90 tablet 3   atorvastatin (LIPITOR) 20 MG tablet TAKE 1 TABLET BY MOUTH EVERY DAY 90 tablet 0   azelastine (ASTELIN) 0.1 % nasal spray Place 1  spray into both nostrils 2 (two) times daily. Use in each nostril as directed 30 mL 12   CALCIUM-VITAMIN D PO Take 1 tablet by mouth daily. 200 units calcium, 1100units vit D     Cholecalciferol (VITAMIN D) 2000 units CAPS Take 2,000 Units by mouth daily.     fluticasone (FLONASE) 50 MCG/ACT nasal spray Place 1 spray into both nostrils daily.     loratadine (CLARITIN) 10 MG tablet Take 10 mg by mouth daily.     Magnesium Oxide 200 MG TABS Take 200 mg by mouth daily.     Multiple Vitamin (MULTIVITAMIN) capsule Take 1 capsule by mouth daily.     omeprazole (PRILOSEC) 20 MG capsule Take 20 mg by mouth daily.      OVER THE COUNTER MEDICATION Take 1 tablet by mouth daily. Zinc     Probiotic Product (PROBIOTIC PO) Take 1 Dose by mouth daily. '300mg'$      vitamin C (ASCORBIC ACID) 500 MG tablet Take 500 mg by mouth daily.     Current Facility-Administered Medications  Medication Dose Route Frequency Provider Last Rate Last Admin   triamcinolone acetonide (KENALOG-40) injection 20 mg  20 mg Other Once [place], Max T, DPM         Review of Systems  Please see the history of present illness.    (+) Dizziness (+) Chest tightness, shortness of breath  All other systems reviewed and are otherwise negative except as noted above.  Physical Exam    Wt Readings from Last 3 Encounters:  03/19/22 222 lb 12.8 oz (101.1 kg)  01/21/22 221 lb 12.8 oz (100.6 kg)  10/08/21 210 lb (95.3 kg)   VS: Vitals:   03/19/22 1457  BP: 108/70  Pulse: 72  SpO2: 95%  ,Body mass index is 30.22 kg/m.  Constitutional:      Appearance: Healthy appearance. Not in distress.  Neck:     Vascular: JVD normal.  Pulmonary:     Effort: Pulmonary effort is normal.     Breath sounds: No wheezing. No rales. Diminished in the bases Cardiovascular:     Normal rate. Regular rhythm. Normal S1. Normal S2.      Murmurs: There is no murmur.  Edema:    Peripheral edema absent.  Abdominal:     Palpations: Abdomen is soft non  tender. There is no hepatomegaly.  Skin:    General: Skin is warm and dry.  Neurological:     General: No focal deficit present.     Mental Status: Alert and oriented to person, place and time.     Cranial Nerves: Cranial nerves are intact.  EKG/LABS/ Recent Cardiac Studies    ECG personally reviewed by me today -none completed today  Lab Results  Component Value Date   WBC 7.8 12/15/2019   HGB 15.7 12/15/2019   HCT 46.7 12/15/2019   MCV 89.1 12/15/2019   PLT 245 12/15/2019   Lab Results  Component Value  Date   CREATININE 1.05 10/02/2021   BUN 16 10/02/2021   NA 139 10/02/2021   K 4.2 10/02/2021   CL 98 10/02/2021   CO2 25 10/02/2021   Lab Results  Component Value Date   ALT 25 10/02/2021   AST 22 10/02/2021   ALKPHOS 90 10/02/2021   BILITOT 1.8 (H) 10/02/2021   Lab Results  Component Value Date   CHOL 127 10/02/2021   HDL 51 10/02/2021   LDLCALC 63 10/02/2021   TRIG 61 10/02/2021   CHOLHDL 2.5 10/02/2021    Lab Results  Component Value Date   HGBA1C 5.7 04/02/2017    Cardiac Studies & Procedures     STRESS TESTS  EXERCISE TOLERANCE TEST (ETT) 10/31/2015  Narrative  Blood pressure demonstrated a normal response to exercise.  There was no ST segment deviation noted during stress.  Low risk Duke treadmill score portends a favorable prognosis with respect to cardiac events.   ECHOCARDIOGRAM  ECHOCARDIOGRAM COMPLETE 03/24/2021  Narrative ECHOCARDIOGRAM REPORT    Patient Name:   DIONTE FULLENKAMP Date of Exam: 03/24/2021 Medical Rec #:  [id number]      Height:       71.0 in Accession #:    [id number]     Weight:       222.6 lb Date of Birth:  11-21-1955      BSA:          2.207 m Patient Age:    72 years       BP:           126/79 mmHg Patient Gender: M              HR:           63 bpm. Exam Location:  [place]  Procedure: 2D Phillip, 3D Phillip, Cardiac Doppler and Color Doppler  Indications:    I48.0 Atrial Fibrillation  History:         Patient has prior history of Echocardiogram examinations, most recent 12/27/2019. CAD, Thoracic aortic aneurysm; Risk Factors:Sleep Apnea and HLD.  Sonographer:    Marygrace Drought RCS Referring Phys: [id number] [place] A CHANDRASEKHAR  IMPRESSIONS   1. Left ventricular ejection fraction, by estimation, is 60 to 65%. Left ventricular ejection fraction by 3D volume is 60 %. The left ventricle has normal function. The left ventricle has no regional wall motion abnormalities. Left ventricular diastolic parameters were normal. 2. Right ventricular systolic function is normal. The right ventricular size is normal. There is normal pulmonary artery systolic pressure. The estimated right ventricular systolic pressure is 99991111 mmHg. 3. The mitral valve is abnormal. Trivial mitral valve regurgitation. 4. The aortic valve is tricuspid. Aortic valve regurgitation is not visualized. 5. Aortic sinus of valsalva measures at 37 mm. 6. The inferior vena cava is normal in size with greater than 50% respiratory variability, suggesting right atrial pressure of 3 mmHg.  Comparison(s): Changes from prior study are noted. 12/27/2019: LVEF 50-55%, borderline dilated sinus of valsalva at 37 mm.  FINDINGS Left Ventricle: Left ventricular ejection fraction, by estimation, is 60 to 65%. Left ventricular ejection fraction by 3D volume is 60 %. The left ventricle has normal function. The left ventricle has no regional wall motion abnormalities. The left ventricular internal cavity size was normal in size. There is no left ventricular hypertrophy. Left ventricular diastolic parameters were normal.  Right Ventricle: The right ventricular size is normal. No increase in right ventricular wall thickness. Right ventricular systolic function is normal. There  is normal pulmonary artery systolic pressure. The tricuspid regurgitant velocity is 1.67 m/s, and with an assumed right atrial pressure of 3 mmHg, the estimated right ventricular  systolic pressure is 99991111 mmHg.  Left Atrium: Left atrial size was normal in size.  Right Atrium: Right atrial size was normal in size.  Pericardium: There is no evidence of pericardial effusion.  Mitral Valve: The mitral valve is abnormal. There is mild thickening of the posterior and anterior mitral valve leaflet(s). Trivial mitral valve regurgitation.  Tricuspid Valve: The tricuspid valve is grossly normal. Tricuspid valve regurgitation is trivial.  Aortic Valve: The aortic valve is tricuspid. Aortic valve regurgitation is not visualized.  Pulmonic Valve: The pulmonic valve was grossly normal. Pulmonic valve regurgitation is trivial.  Aorta: The aortic root and ascending aorta are structurally normal, with no evidence of dilitation and sinus of valsalva measures at 37 mm.  Venous: The inferior vena cava is normal in size with greater than 50% respiratory variability, suggesting right atrial pressure of 3 mmHg.  IAS/Shunts: No atrial level shunt detected by color flow Doppler.   LEFT VENTRICLE PLAX 2D LVIDd:         5.60 cm         Diastology LVIDs:         4.40 cm         LV e' medial:    9.03 cm/s LV PW:         1.00 cm         LV E/e' medial:  7.1 LV IVS:        0.80 cm         LV e' lateral:   12.90 cm/s LVOT diam:     2.30 cm         LV E/e' lateral: 5.0 LV SV:         80 LV SV Index:   36 LVOT Area:     4.15 cm        3D Volume EF LV 3D EF:    Left ventricul ar ejection fraction by 3D volume is 60 %.  3D Volume EF: 3D EF:        60 % LV EDV:       163 ml LV ESV:       66 ml LV SV:        97 ml  RIGHT VENTRICLE RV Basal diam:  3.80 cm RV S prime:     17.50 cm/s RVSP:           14.2 mmHg  LEFT ATRIUM             Index        RIGHT ATRIUM           Index LA diam:        4.10 cm 1.86 cm/m   RA Pressure: 3.00 mmHg LA Vol (A2C):   53.1 ml 24.06 ml/m  RA Area:     14.60 cm LA Vol (A4C):   41.5 ml 18.81 ml/m  RA Volume:   36.30 ml  16.45 ml/m LA Biplane  Vol: 49.2 ml 22.30 ml/m AORTIC VALVE LVOT Vmax:   91.00 cm/s LVOT Vmean:  59.800 cm/s LVOT VTI:    0.192 m  AORTA Ao Root diam: 3.60 cm Ao Asc diam:  3.40 cm  MITRAL VALVE               TRICUSPID VALVE MV Area (PHT):  TR Peak grad:   11.2 mmHg MV Decel Time:             TR Vmax:        167.00 cm/s MV E velocity: 64.50 cm/s  Estimated RAP:  3.00 mmHg MV A velocity: 63.40 cm/s  RVSP:           14.2 mmHg MV E/A ratio:  1.02 SHUNTS Systemic VTI:  0.19 m Systemic Diam: 2.30 cm  Lyman Bishop MD Electronically signed by Lyman Bishop MD Signature Date/Time: 03/24/2021/9:01:04 PM    Final    MONITORS  LONG TERM MONITOR (3-14 DAYS) 10/12/2021  Narrative   Patient had a minimum heart rate of 48 bpm, maximum heart rate of 148 bpm, and average heart rate of 72 bpm.Predominant underlying rhythm was sinus rhythm.   Paroxysmal SVT (one episode) lasting 4 beats at longest.   Isolated PACs were rare (<1.0%).   Isolated PVCs were rare (<1.0%).   Triggered and diary events associated with sinus rhythm.  No malignant arrhythmias.   CT SCANS  CT CORONARY MORPH W/CTA COR W/SCORE 04/10/2020  Addendum 04/10/2020  3:59 PM ADDENDUM REPORT: 04/10/2020 15:57  CLINICAL DATA:  12M with DOE  EXAM: Cardiac/Coronary CTA  TECHNIQUE: The patient was scanned on a [employer].  FINDINGS: A 100 kV prospective scan was triggered in the descending thoracic aorta at 111 HU's. Axial non-contrast 3 mm slices were carried out through the heart. The data set was analyzed on a dedicated work station and scored using the [place]. Gantry rotation speed was 250 msecs and collimation was .6 mm. 0.8 mg of sl NTG was given. The 3D data set was reconstructed in 5% intervals of the 35-75 % of the R-R cycle. Phases were analyzed on a dedicated work station using MPR, MIP and VRT modes. The patient received 80 cc of contrast.  Coronary Arteries:  Normal coronary origin.  Right  dominance.  RCA is a large dominant artery that gives rise to PDA and PLA. There is no plaque.  Left main is a large artery that gives rise to LAD and LCX arteries.  LAD is a large vessel. Noncalcified plaque in the ostial LAD causes minimal (0-24%) stenosis  LCX is a non-dominant artery that gives rise to one large OM1 branch. There is no plaque.  Other findings:  Left Ventricle: Mild dilatation  Left Atrium: Mild enlargement  Pulmonary Veins: Normal configuration  Right Ventricle: Mild dilatation  Right Atrium: Mild enlargement  Cardiac valves: No calcifications  Thoracic aorta: Normal size  Pulmonary Arteries: Normal size  Systemic Veins: Normal drainage  Pericardium: Normal thickness  IMPRESSION: 1. Coronary calcium score of 0.  2. Normal coronary origin with right dominance.  3. Nonobstructive CAD with noncalcified plaque causing minimal (0-24%) stenosis in ostial LAD  CAD-RADS 1. Minimal non-obstructive CAD (0-24%). Consider non-atherosclerotic causes of chest pain. Consider preventive therapy and risk factor modification.   Electronically Signed By: Oswaldo Milian MD On: 04/10/2020 15:57  Narrative EXAM: OVER-READ INTERPRETATION  CT CHEST  The following report is an over-read performed by radiologist Dr. Vinnie Langton of [facility], [place] on 04/10/2020. This over-read does not include interpretation of cardiac or coronary anatomy or pathology. The coronary calcium score/coronary CTA interpretation by the cardiologist is attached.  COMPARISON:  Chest CT 01/03/2009.  FINDINGS: Atherosclerotic calcifications in the thoracic aorta. Within the visualized portions of the thorax there are no suspicious appearing pulmonary nodules or masses, there is no acute consolidative airspace  disease, no pleural effusions, no pneumothorax and no lymphadenopathy. Visualized portions of the upper abdomen are unremarkable. There are no aggressive  appearing lytic or blastic lesions noted in the visualized portions of the skeleton.  IMPRESSION: 1.  Aortic Atherosclerosis (ICD10-I70.0).  Electronically Signed: By: Vinnie Langton M.D. On: 04/10/2020 14:59          Assessment & Plan    1.  Aortic atherosclerosis: -Atherosclerotic calcifications in the thoracic aorta -Patient currently on GDMT with ASA 81 mg and Lipitor 20 mg -Today patient reports intermittent episodes of shortness of breath with chest pressure with walking up stairs and increased physical exertion. -He will complete a Myoview to rule out any ischemia related to chest pressure and shortness of breath.   2.  Paroxysmal atrial fibrillation: -Today patient is sinus rhythm with rate of 67 -He does report occasional palpitations in the evenings. -Today patient denies any increased palpitations or sustained arrhythmias. -He has decreased his caffeine intake and is working on increasing his hydration.  3.  Nonobstructive coronary artery disease: -Today patient states no chest pain or anginal equivalents -Coronary CTA completed with calcium score of 0 -Continue ASA and atorvastatin -He has experienced dizziness, chest pressure, and shortness of breath with increased physical activity. -We will proceed with ischemic evaluation as noted above.   4.  Hyperlipidemia: -Last LDL was 64 at goal of less than 70 -Continue atorvastatin as noted above      Disposition: Follow-up with Werner Lean, MD or APP in 6 months Shared Decision Making/Informed Consent The risks [chest pain, shortness of breath, cardiac arrhythmias, dizziness, blood pressure fluctuations, myocardial infarction, stroke/transient ischemic attack, nausea, vomiting, allergic reaction, radiation exposure, metallic taste sensation and life-threatening complications (estimated to be 1 in 10,000)], benefits (risk stratification, diagnosing coronary artery disease, treatment guidance) and  alternatives of a nuclear stress test were discussed in detail with Mr. Zunk and he agrees to proceed.   Medication Adjustments/Labs and Tests Ordered: Current medicines are reviewed at length with the patient today.  Concerns regarding medicines are outlined above.   Signed, Mable Fill, Marissa Nestle, NP 03/19/2022, 3:19 PM [facility]  Note:  This document was prepared using Dragon voice recognition software and may include unintentional dictation errors.

## 2022-03-19 ENCOUNTER — Ambulatory Visit: Payer: BC Managed Care – PPO | Attending: Nurse Practitioner | Admitting: Nurse Practitioner

## 2022-03-19 ENCOUNTER — Encounter: Payer: Self-pay | Admitting: Nurse Practitioner

## 2022-03-19 VITALS — BP 108/70 | HR 72 | Ht 72.0 in | Wt 222.8 lb

## 2022-03-19 DIAGNOSIS — I700 Atherosclerosis of aorta: Secondary | ICD-10-CM | POA: Diagnosis not present

## 2022-03-19 DIAGNOSIS — I480 Paroxysmal atrial fibrillation: Secondary | ICD-10-CM | POA: Diagnosis not present

## 2022-03-19 DIAGNOSIS — E7849 Other hyperlipidemia: Secondary | ICD-10-CM | POA: Diagnosis not present

## 2022-03-19 DIAGNOSIS — I2510 Atherosclerotic heart disease of native coronary artery without angina pectoris: Secondary | ICD-10-CM

## 2022-03-19 DIAGNOSIS — R0602 Shortness of breath: Secondary | ICD-10-CM

## 2022-03-19 NOTE — Patient Instructions (Signed)
Medication Instructions:  Your physician recommends that you continue on your current medications as directed. Please refer to the Current Medication list given to you today. *If you need a refill on your cardiac medications before your next appointment, please call your pharmacy*   Lab Work: None ordered If you have labs (blood work) drawn today and your tests are completely normal, you will receive your results only by: [place] (if you have MyChart) OR A paper copy in the mail If you have any lab test that is abnormal or we need to change your treatment, we will call you to review the results.   Testing/Procedures: Your physician has requested that you have a lexiscan myoview. For further information please visit [email or web address]. Please follow instruction sheet, as given.   Follow-Up: At [facility], you and your health needs are our priority.  As part of our continuing mission to provide you with exceptional heart care, we have created designated Provider Care Teams.  These Care Teams include your primary Cardiologist (physician) and Advanced Practice Providers (APPs -  Physician Assistants and Nurse Practitioners) who all work together to provide you with the care you need, when you need it.  We recommend signing up for the patient portal called "MyChart".  Sign up information is provided on this After Visit Summary.  MyChart is used to connect with patients for Virtual Visits (Telemedicine).  Patients are able to view lab/test results, encounter notes, upcoming appointments, etc.  Non-urgent messages can be sent to your provider as well.   To learn more about what you can do with MyChart, go to [email or web address].    Your next appointment:   6 month(s)  Provider:   Werner Lean, MD     Other Instructions You have been referred to Allergy and Asthma.

## 2022-03-20 ENCOUNTER — Telehealth (HOSPITAL_COMMUNITY): Payer: Self-pay | Admitting: *Deleted

## 2022-03-20 NOTE — Telephone Encounter (Signed)
Close encounter 

## 2022-03-23 ENCOUNTER — Ambulatory Visit (HOSPITAL_COMMUNITY): Payer: BC Managed Care – PPO | Attending: Internal Medicine

## 2022-03-23 DIAGNOSIS — I700 Atherosclerosis of aorta: Secondary | ICD-10-CM | POA: Insufficient documentation

## 2022-03-23 DIAGNOSIS — I2510 Atherosclerotic heart disease of native coronary artery without angina pectoris: Secondary | ICD-10-CM | POA: Diagnosis not present

## 2022-03-23 DIAGNOSIS — E7849 Other hyperlipidemia: Secondary | ICD-10-CM | POA: Insufficient documentation

## 2022-03-23 DIAGNOSIS — I480 Paroxysmal atrial fibrillation: Secondary | ICD-10-CM | POA: Diagnosis not present

## 2022-03-23 LAB — MYOCARDIAL PERFUSION IMAGING
LV dias vol: 137 mL (ref 62–150)
LV sys vol: 63 mL
Nuc Stress EF: 54 %
Peak HR: 95 {beats}/min
Rest HR: 55 {beats}/min
Rest Nuclear Isotope Dose: 10.4 mCi
SDS: 1
SRS: 0
SSS: 1
ST Depression (mm): 0 mm
Stress Nuclear Isotope Dose: 32.6 mCi
TID: 1.01

## 2022-03-23 IMAGING — CT CT HEART MORP W/ CTA COR W/ SCORE W/ CA W/CM &/OR W/O CM
4 of 7 series · 8 of 20 positions shown, 9 images · non-contrast
Comparison: Chest CT 01/03/2009.
COMPARISON: Chest CT 01/03/2009.

Addendum:
EXAM:
OVER-READ INTERPRETATION  CT CHEST

The following report is an over-read performed by radiologist Dr.
Nguse Dammam [REDACTED] on 04/10/2020. This
over-read does not include interpretation of cardiac or coronary
anatomy or pathology. The coronary calcium score/coronary CTA
interpretation by the cardiologist is attached.
CLINICAL DATA: 64M with MASUDE
Cardiac/Coronary CTA
TECHNIQUE: The patient was scanned on a [employer] scanner.

[Series 6: best diast 70 % · axial · 0.39mm/px · z∈[-222,-179]mm · 2 of 321 slices shown]
[im 107/321  vessel]
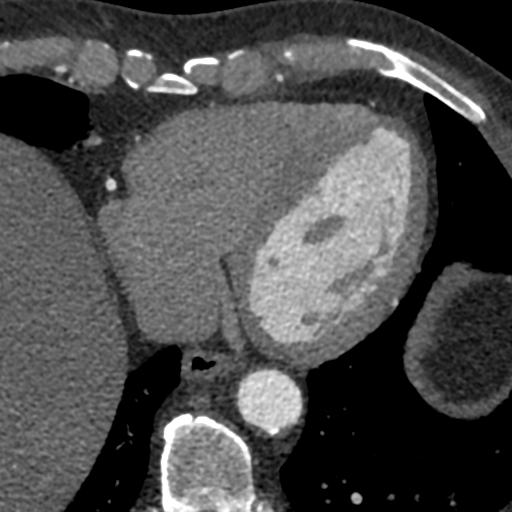
[im 214/321  vessel]
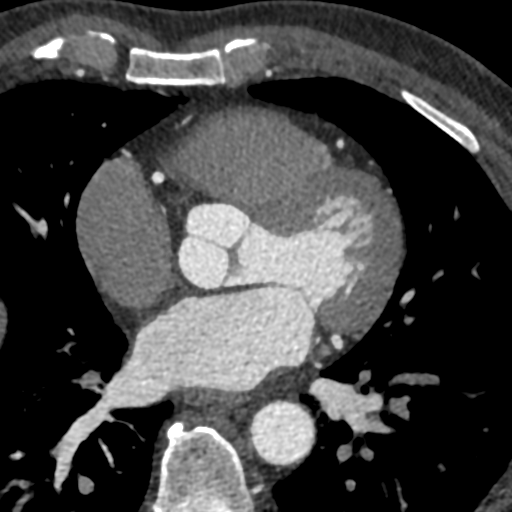

[Series 7: best syst · axial · 0.39mm/px · z∈[-222,-179]mm · 2 of 321 slices shown, 3 images]
[im 107/321  vessel]
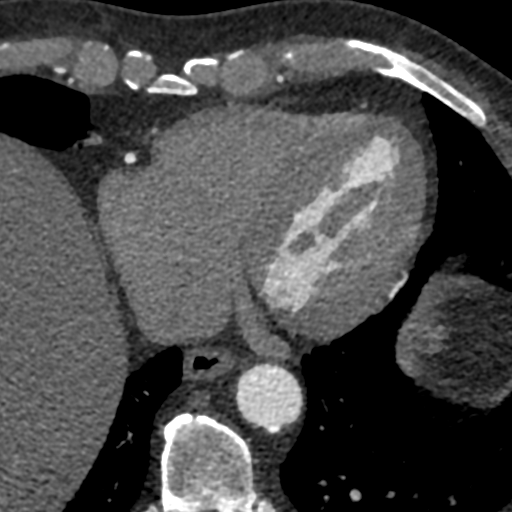
[im 107/321  lung]
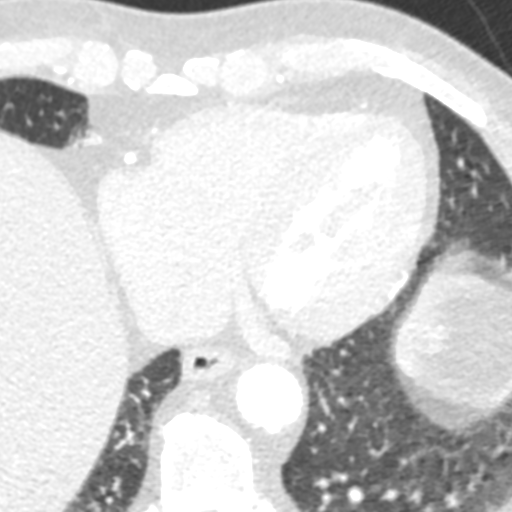
[im 214/321  vessel]
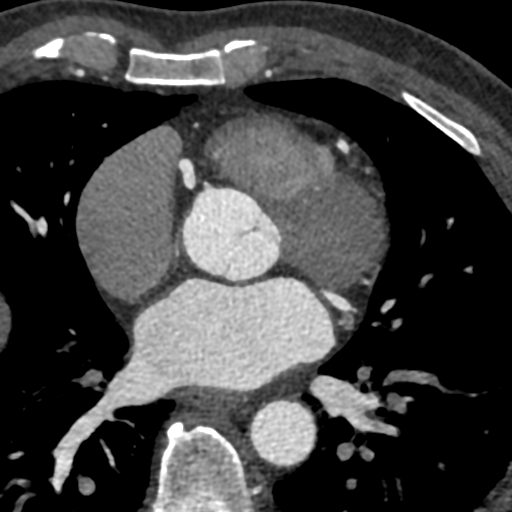

[Series 9: ts diast sharp 70 % · axial · 0.39mm/px · z∈[-222,-179]mm · 2 of 321 slices shown]
[im 107/321  lung]
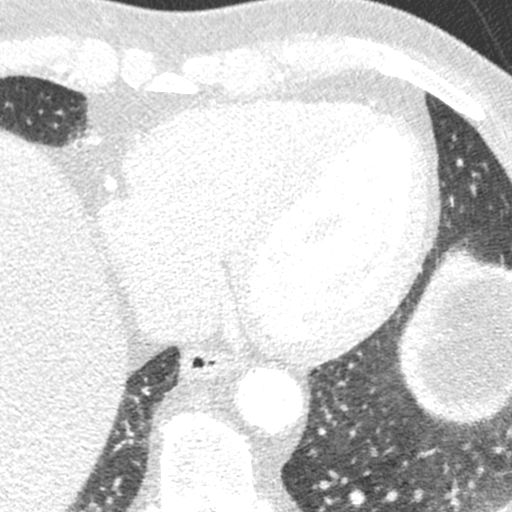
[im 214/321  lung]
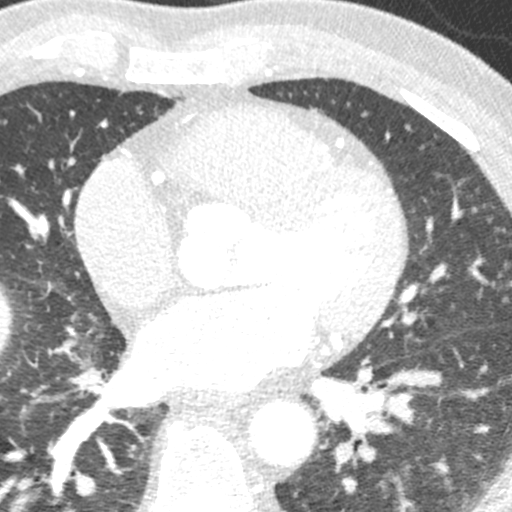

[Series 10: ts syst sharp · axial · 0.39mm/px · z∈[-222,-179]mm · 2 of 321 slices shown]
[im 107/321  lung]
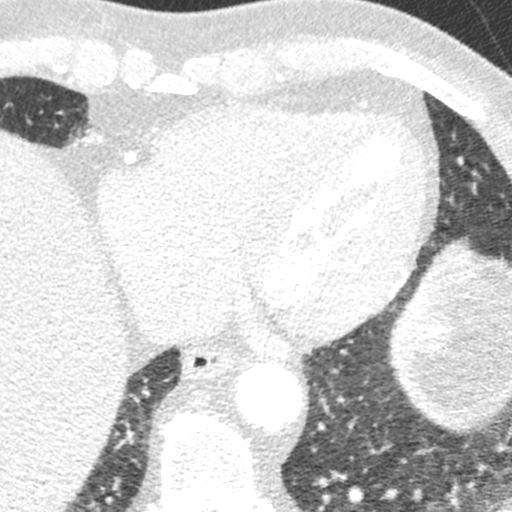
[im 214/321  lung]
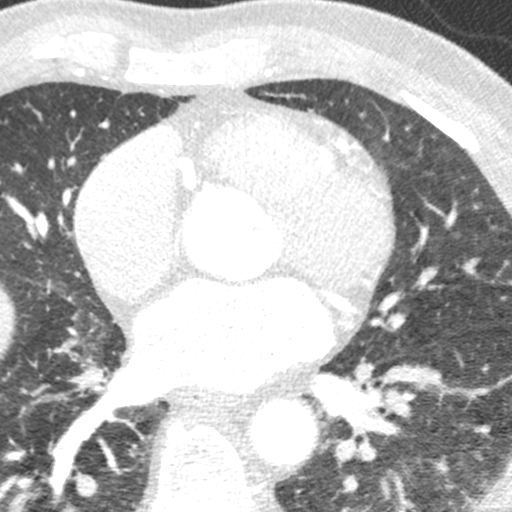

[8 of 20 positions shown; findings below may reference images not displayed]

FINDINGS: Atherosclerotic calcifications in the thoracic aorta. Within the
visualized portions of the thorax there are no suspicious appearing
pulmonary nodules or masses, there is no acute consolidative
airspace disease, no pleural effusions, no pneumothorax and no
lymphadenopathy. Visualized portions of the upper abdomen are
unremarkable. There are no aggressive appearing lytic or blastic
lesions noted in the visualized portions of the skeleton.
IMPRESSION: 1.  Aortic Atherosclerosis (Q2GF4-SN6.6).
FINDINGS: A 100 kV prospective scan was triggered in the descending thoracic
aorta at 111 HU's. Axial non-contrast 3 mm slices were carried out
through the heart. The data set was analyzed on a dedicated work
station and scored using the [place] method. Gantry rotation speed
was 250 msecs and collimation was .6 mm. 0.8 mg of sl NTG was given.
The 3D data set was reconstructed in 5% intervals of the 35-75 % of
the R-R cycle. Phases were analyzed on a dedicated work station
using MPR, MIP and VRT modes. The patient received 80 cc of
contrast.

Coronary Arteries:  Normal coronary origin.  Right dominance.

RCA is a large dominant artery that gives rise to PDA and PLA. There
is no plaque.

Left main is a large artery that gives rise to LAD and LCX arteries.

LAD is a large vessel. Noncalcified plaque in the ostial LAD causes
minimal (0-24%) stenosis

LCX is a non-dominant artery that gives rise to one large OM1
branch. There is no plaque.

Other findings:

Left Ventricle: Mild dilatation

Left Atrium: Mild enlargement

Pulmonary Veins: Normal configuration

Right Ventricle: Mild dilatation

Right Atrium: Mild enlargement

Cardiac valves: No calcifications

Thoracic aorta: Normal size

Pulmonary Arteries: Normal size

Systemic Veins: Normal drainage

Pericardium: Normal thickness
IMPRESSION: 1. Coronary calcium score of 0.

2. Normal coronary origin with right dominance.

3. Nonobstructive CAD with noncalcified plaque causing minimal
(0-24%) stenosis in ostial LAD

CAD-RADS 1. Minimal non-obstructive CAD (0-24%). Consider
non-atherosclerotic causes of chest pain. Consider preventive
therapy and risk factor modification.

*** End of Addendum ***
EXAM:
OVER-READ INTERPRETATION  CT CHEST

The following report is an over-read performed by radiologist Dr.
Nguse Dammam [REDACTED] on 04/10/2020. This
over-read does not include interpretation of cardiac or coronary
anatomy or pathology. The coronary calcium score/coronary CTA
interpretation by the cardiologist is attached.
FINDINGS: Atherosclerotic calcifications in the thoracic aorta. Within the
visualized portions of the thorax there are no suspicious appearing
pulmonary nodules or masses, there is no acute consolidative
airspace disease, no pleural effusions, no pneumothorax and no
lymphadenopathy. Visualized portions of the upper abdomen are
unremarkable. There are no aggressive appearing lytic or blastic
lesions noted in the visualized portions of the skeleton.
IMPRESSION: 1.  Aortic Atherosclerosis (Q2GF4-SN6.6).

## 2022-03-23 MED ORDER — REGADENOSON 0.4 MG/5ML IV SOLN
0.4000 mg | Freq: Once | INTRAVENOUS | Status: AC
  Administered 2022-03-23: 0.4 mg via INTRAVENOUS

## 2022-03-23 MED ORDER — TECHNETIUM TC 99M TETROFOSMIN IV KIT
32.6000 | PACK | Freq: Once | INTRAVENOUS | Status: AC | PRN
  Administered 2022-03-23: 32.6 via INTRAVENOUS

## 2022-03-23 MED ORDER — TECHNETIUM TC 99M TETROFOSMIN IV KIT
10.4000 | PACK | Freq: Once | INTRAVENOUS | Status: AC | PRN
  Administered 2022-03-23: 10.4 via INTRAVENOUS

## 2022-03-26 ENCOUNTER — Other Ambulatory Visit: Payer: Self-pay

## 2022-03-26 DIAGNOSIS — I480 Paroxysmal atrial fibrillation: Secondary | ICD-10-CM

## 2022-03-26 DIAGNOSIS — I700 Atherosclerosis of aorta: Secondary | ICD-10-CM

## 2022-03-26 DIAGNOSIS — I2510 Atherosclerotic heart disease of native coronary artery without angina pectoris: Secondary | ICD-10-CM

## 2022-04-06 NOTE — Progress Notes (Unsigned)
New Patient Note  RE: Phillip Mcguire MRN: [id number] DOB: Jul 25, 1955 Date of Office Visit: 04/07/2022  Consult requested by: Marylu Lund., NP Primary care provider: Kathyrn Drown, MD  Chief Complaint: Other (Congestions and dizzyness and pt states it comes and goes not sure where its coming from.pt states he starts itchy from waist up and bumps in head.)  History of Present Illness: I had the pleasure of seeing Phillip Mcguire for initial evaluation at the [facility] on 04/07/2022. He is a 67 y.o. male, who is referred here by Ambrose Pancoast, NP (cards) for the evaluation of allergies.  He reports symptoms of some itchy eyes, crusting of the eyes, congestion, sneezing, dizziness, itching. Symptoms have been going on for a few months.  Other triggers include exposure to unknown. Anosmia: no. Headache: sometimes. He has used OTC antihistamines, Flonase, OTC eye drops with some improvement in symptoms. Sinus infections: no. Previous work up includes: in the past due to sinus issues - and patient is not sure of results.  Previous ENT evaluation: yes. Last eye exam: last year History of reflux: takes medications for this.  Patient saw cardiology for his dizziness and he is being worked up by them for this as well - scheduled for echo. Stress test was apparently normal.  He does have a fib which apparently is well controlled.   Rash, itching started about 2 months ago. Mainly occurs on his head. Describes them as itchy, little bumps. Individual rashes lasts about a day. No ecchymosis upon resolution. Associated symptoms include: none.  Frequency of episodes: daily. Suspected triggers are unknown.   Denies any fevers, chills, changes in medications, foods, personal care products or recent infections. He has tried the following therapies: patient switched to free and clear items with no benefit. Systemic steroids: no. Currently on daily antihistamines.  Previous work up  includes: none. Patient does see a dermatologist for skin cancer check.  Previous history of rash/hives: no. Patient is up to date with the following cancer screening tests: physical exam, colonoscopy.  Assessment and Plan: Phillip Mcguire is a 67 y.o. male with: Other allergic rhinitis Rhino conjunctivitis symptoms x few months. Tried OTC antihistamines, Flonase and eye drops with some benefit. Follows with ENT. No issues with the eyes last year.  Today's skin testing showed: Positive to mold.  Start environmental control measures as below. Discussed with patient I'm not convinced that the above allergens are causing his symptoms.  Use over the counter antihistamines such as Zyrtec (cetirizine), Claritin (loratadine), Allegra (fexofenadine), or Xyzal (levocetirizine) daily as needed. May take twice a day during allergy flares. May switch antihistamines every few months. Use Flonase (fluticasone) nasal spray 1 spray per nostril twice a day as needed for nasal congestion.  Nasal saline spray (i.e., Simply Saline) or nasal saline lavage (i.e., NeilMed) is recommended as needed and prior to medicated nasal sprays. May use refresh eye drops. Avoid eye drops that state get the red out.  Recommend repeat eye evaluation.   Seborrheic dermatitis Pruritic rash on the scalp x 2 months. No specific triggers noted. Similar issue in the past which cleared up with some type of topical treatment in the past. Possible seborrheic dermatitis.  Use ketoconazole shampoo - apply to scalp 2-3 times per week for 1 month.  Use over the counter antihistamines such as Zyrtec (cetirizine), Claritin (loratadine), Allegra (fexofenadine), or Xyzal (levocetirizine) daily as needed for the itching.  If symptoms are not controlled or  causes drowsiness let us know. See below for proper skin care. If no improvement will try dermasmoothe scalp oil next and will refer him to see his dermatologist.   Mild persistent asthma without  complication Used albuterol during Covid-19. No prior asthma diagnosis. Complaining of dizziness and follows with cards (paf) and pulm (osa).  Today's spirometry showed: No overt abnormalities noted given today's efforts with 32% improvement in FEV1 post bronchodilator treatment. Clinically feeling improved.  This is concerning for new onset asthma.  Daily controller medication(s): start Arnuity 140mcg 1 puff once a day and rinse mouth after each use. Demonstrated proper use.  Will start with single ICS inhaler and levoalbuterol prn due to his history of afib.  May use levoalbuterol rescue inhaler 2 puffs every 4 to 6 hours as needed for shortness of breath, chest tightness, coughing, and wheezing. Monitor frequency of use.  Get spirometry at next visit.  Dizziness Keep follow up with cardiology. Monitor symptoms.  Return in about 4 weeks (around 05/05/2022).  Meds ordered this encounter  Medications   KETOCONAZOLE, TOPICAL, 1 % SHAM    Sig: Apply to scalp 2-3 times per week for 1 month.    Dispense:  200 mL    Refill:  0   levalbuterol (XOPENEX HFA) 45 MCG/ACT inhaler    Sig: Inhale 2 puffs into the lungs every 4 (four) hours as needed for wheezing or shortness of breath (coughing fits).    Dispense:  1 each    Refill:  2   Fluticasone Furoate (ARNUITY ELLIPTA) 100 MCG/ACT AEPB    Sig: Inhale 1 puff into the lungs daily. Rinse mouth after each use.    Dispense:  30 each    Refill:  2   Lab Orders  No laboratory test(s) ordered today    Other allergy screening: Asthma: no Patient was seen by pulmonology in the past - used albuterol when had Covid-19.   Food allergy: no Medication allergy: no Hymenoptera allergy: no Eczema:no History of recurrent infections suggestive of immunodeficency: no  Diagnostics: Spirometry:  Tracings reviewed. His effort: It was hard to get consistent efforts and there is a question as to whether this reflects a maximal maneuver. FVC:  3.15L FEV1: 2.40L, 81% predicted FEV1/FVC ratio: 76% Interpretation: No overt abnormalities noted given today's efforts with 32% improvement in FEV1 post bronchodilator treatment. Clinically feeling improved.   Please see scanned spirometry results for details.  Skin Testing: Environmental allergy panel and select foods. Positive to mold.  Negative to common foods.  Results discussed with patient/family.  Airborne Adult Perc - 04/07/22 1404     Time Antigen Placed 1404    Allergen Manufacturer Lavella Hammock    Location Back    Number of Test 59    1. Control-Buffer 50% Glycerol Negative    2. Control-Histamine 1 mg/ml 3+    3. Albumin saline Negative    4. [place] Negative    5. [place] Negative    6. Johnson Negative    7. Rosenberg Blue Negative    8. Meadow Fescue Negative    9. Perennial Rye Negative    10. Sweet Vernal Negative    11. Timothy Negative    12. Cocklebur Negative    13. Burweed Marshelder Negative    14. Ragweed, short Negative    15. Ragweed, Giant Negative    16. Plantain,  English Negative    17. Lamb's Quarters Negative    18. Sheep Sorrell Negative    19. Rough Pigweed  Negative    20. Marsh Elder, Rough Negative    21. Mugwort, Common Negative    22. Ash mix Negative    23. Birch mix Negative    24. Beech American Negative    25. Box, Elder Negative    26. Cedar, red Negative    27. Cottonwood, [place] Negative    28. Elm mix Negative    29. Hickory Negative    30. Maple mix Negative    31. Oak, [place] mix Negative    32. Pecan Pollen Negative    33. Pine mix Negative    34. Sycamore Eastern Negative    35. [place], Black Pollen Negative    36. Alternaria alternata Negative    37. Cladosporium Herbarum Negative    38. Aspergillus mix Negative    39. Penicillium mix Negative    40. Bipolaris sorokiniana (Helminthosporium) Negative    41. Drechslera spicifera (Curvularia) Negative    42. Mucor plumbeus Negative    43. Fusarium moniliforme Negative     44. Aureobasidium pullulans (pullulara) Negative    45. Rhizopus oryzae Negative    46. Botrytis cinera Negative    47. Epicoccum nigrum Negative    48. Phoma betae Negative    49. Candida Albicans Negative    50. Trichophyton mentagrophytes Negative    51. Mite, D Farinae  5,000 AU/ml Negative    52. Mite, D Pteronyssinus  5,000 AU/ml Negative    53. Cat Hair 10,000 BAU/ml Negative    54.  Dog Epithelia Negative    55. Mixed Feathers Negative    56. Horse Epithelia Negative    57. Cockroach, German Negative    58. Mouse Negative    59. Tobacco Leaf Negative             Intradermal - 04/07/22 1445     Time Antigen Placed 1446    Allergen Manufacturer Lavella Hammock    Location Back    Number of Test 15    Intradermal Select    Control Negative    Guatemala Negative    Johnson Negative    7 Grass Negative    Ragweed mix Negative    Weed mix Negative    Tree mix Negative    Mold 1 2+    Mold 2 Negative    Mold 3 2+    Mold 4 3+    Cat Negative    Dog Negative    Cockroach Negative    Mite mix Negative             Food Adult Perc - 04/07/22 1400     Time Antigen Placed 1404    Allergen Manufacturer Greer    Location Back    Number of allergen test 10    1. Peanut Negative    2. Soybean Negative    3. Wheat Negative    4. Sesame Negative    5. Milk, cow Negative    6. Egg White, Chicken Negative    7. Casein Negative    8. Shellfish Mix Negative    9. Fish Mix Negative    10. 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   Coronary artery disease involving native heart without angina  pectoris 04/17/2020   Thoracic aorta atherosclerosis ([place]) 04/17/2020   PAF (paroxysmal atrial fibrillation) ([place]) 03/21/2020   Chronic inflammation of mastoid cavity 12/22/2019   History of left mastoidectomy 12/22/2019   Mixed conductive and sensorineural hearing loss of left ear with restricted hearing of right ear 12/22/2019   Chronic mycotic otitis externa 09/14/2019   Pincer nail deformity 06/08/2018   Pain in finger of both hands 05/30/2018   Impacted cerumen of both ears 05/19/2018   Pain in left knee 03/22/2018   Encounter for screening colonoscopy 12/25/2015   Hemorrhoids 12/25/2015   Chronic swimmer's ear of both sides 04/16/2015   Chronic pansinusitis 03/25/2015   Perennial allergic rhinitis 03/25/2015   Bilateral chronic secretory otitis media 03/11/2015   Dysfunction of both eustachian tubes 03/11/2015   GERD (gastroesophageal reflux disease) 06/10/2014   Chronic lumbar pain 04/20/2014   Peripheral neuropathy 10/20/2012   OSA (obstructive sleep apnea) 07/30/2010   Hyperlipidemia 01/29/2009   Hearing loss 01/25/2009   PAIN IN JOINT, SITE UNSPECIFIED 01/25/2009   Past Medical History:  Diagnosis Date   Abnormal LFTs    mild elevation of total, indirect and direct bilirubin   Allergy    Congenital absence of kidney    Right   GERD (gastroesophageal reflux disease)    Hearing loss    Sleep apnea    Past Surgical History: Past Surgical History:  Procedure Laterality Date   COLONOSCOPY  2007   [place]k: normal   COLONOSCOPY N/A 12/26/2015   Procedure: COLONOSCOPY;  Surgeon: Daneil Dolin, MD;  [facility];  Service: Endoscopy;  Laterality: N/A;  2:00 PM   ESOPHAGOGASTRODUODENOSCOPY  01/2002   Dr. Deatra Ina: Normal   NAILBED REPAIR Right 06/14/2018   Procedure: RECONSTRUCTION NAILBED WITH PALMARIS GRAFT RIGHT THUMB;  Surgeon: Daryll Brod, MD;  Location: [facility];  Service: Orthopedics;  Laterality: Right;  AXILLARY BLOCK   NASAL SEPTUM  SURGERY  1980's   otosclerosis     bilateral ear surgery   SHOULDER ARTHROSCOPY W/ ROTATOR CUFF REPAIR Left 2003   titanium implant Left March 2010   left middle ear   Medication List:  Current Outpatient Medications  Medication Sig Dispense Refill   aspirin EC 81 MG tablet Take 1 tablet (81 mg total) by mouth daily. Swallow whole. 90 tablet 3   atorvastatin (LIPITOR) 20 MG tablet TAKE 1 TABLET BY MOUTH EVERY DAY 90 tablet 0   azelastine (ASTELIN) 0.1 % nasal spray Place 1 spray into both nostrils 2 (two) times daily. Use in each nostril as directed 30 mL 12   CALCIUM-VITAMIN D PO Take 1 tablet by mouth daily. 200 units calcium, 1100units vit D     Cholecalciferol (VITAMIN D) 2000 units CAPS Take 2,000 Units by mouth daily.     fluticasone (FLONASE) 50 MCG/ACT nasal spray Place 1 spray into both nostrils daily.     Fluticasone Furoate (ARNUITY ELLIPTA) 100 MCG/ACT AEPB Inhale 1 puff into the lungs daily. Rinse mouth after each use. 30 each 2   KETOCONAZOLE, TOPICAL, 1 % SHAM Apply to scalp 2-3 times per week for 1 month. 200 mL 0   levalbuterol (XOPENEX HFA) 45 MCG/ACT inhaler Inhale 2 puffs into the lungs every 4 (four) hours as needed for wheezing or shortness of breath (coughing fits). 1 each 2   loratadine (CLARITIN) 10 MG tablet Take 10  mg by mouth daily.     Magnesium Oxide 200 MG TABS Take 200 mg by mouth daily.     Multiple Vitamin (MULTIVITAMIN) capsule Take 1 capsule by mouth daily.     omeprazole (PRILOSEC) 20 MG capsule Take 20 mg by mouth daily.      OVER THE COUNTER MEDICATION Take 1 tablet by mouth daily. Zinc     Probiotic Product (PROBIOTIC PO) Take 1 Dose by mouth daily. 300mg      vitamin C (ASCORBIC ACID) 500 MG tablet Take 500 mg by mouth daily.     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    Comment: [employer]  Tobacco Use   Smoking status: Former    Packs/day: 1.50    Years: 15.00    Additional pack years: 0.00    Total pack years: 22.50    Types: Cigarettes    Quit date: 01/13/1988    Years since quitting: 34.2   Smokeless tobacco: Former  Scientific laboratory technician Use: Never used  Substance and Sexual Activity   Alcohol use: Yes    Alcohol/week: 0.0 standard drinks of alcohol    Comment: rarely drink beer   Drug use: No   Sexual activity: Yes    Partners: Female  Other Topics Concern   Not on file  Social History Narrative   He works in Theatre manager, Energy manager   Lives with wife.     Caffeine use: 2 cups coffee per day   Social Determinants of Health   Financial Resource Strain: Not on file  Food Insecurity: Not on file  Transportation Needs: Not on file  Physical Activity: Not on file  Stress: Not on file  Social Connections: Not on file   Lives in a house. Smoking: denies Occupation: maintenance  Environmental HistoryFreight forwarder in the house: no Charity fundraiser in the family room: yes Carpet in the bedroom: yes Heating: heat pump Cooling: heat pump Pet: no  Family History: Family History  Problem Relation Age of Onset   Heart disease Mother    Hypertension Mother    Diabetes Mother    Hyperlipidemia Mother    Heart disease Father    Lung cancer Father    Hyperlipidemia Father    Diabetes Father    Allergies Son    Cancer Maternal Aunt        breast   Cancer Maternal Uncle        colon   Cancer Paternal Aunt        breast   Cancer Paternal Uncle        brain tumor   Diabetes Sister    Neuropathy Neg  Hx    Review of Systems  Constitutional:  Negative for appetite change, chills, fever and unexpected weight change.  HENT:  Negative for congestion and rhinorrhea.   Eyes:  Positive for itching.       Bluriness  Respiratory:  Positive for chest tightness. Negative for cough,  shortness of breath and wheezing.   Cardiovascular:  Negative for chest pain.  Gastrointestinal:  Negative for abdominal pain.  Genitourinary:  Negative for difficulty urinating.  Skin:  Positive for rash.  Allergic/Immunologic: Positive for environmental allergies. Negative for food allergies.  Neurological:  Positive for headaches.    Objective: BP 116/62   Pulse 65   Temp 98 F (36.7 C)   Resp 20   Ht 6' (1.829 m)   Wt 226 lb (102.5 kg)   SpO2 95%   BMI 30.65 kg/m  Body mass index is 30.65 kg/m. Physical Exam Vitals and nursing note reviewed.  Constitutional:      Appearance: Normal appearance. He is well-developed.  HENT:     Head: Normocephalic and atraumatic.     Right Ear: Tympanic membrane and external ear normal.     Left Ear: Tympanic membrane and external ear normal.     Nose: Nose normal.     Mouth/Throat:     Mouth: Mucous membranes are moist.     Pharynx: Oropharynx is clear.  Eyes:     Conjunctiva/sclera: Conjunctivae normal.  Cardiovascular:     Rate and Rhythm: Normal rate and regular rhythm.     Heart sounds: Normal heart sounds. No murmur heard.    No friction rub. No gallop.  Pulmonary:     Effort: Pulmonary effort is normal.     Breath sounds: Normal breath sounds. No wheezing, rhonchi or rales.  Musculoskeletal:     Cervical back: Neck supple.  Skin:    General: Skin is warm.     Findings: Rash present.     Comments: Erythematous bumps on the scalp.   Neurological:     Mental Status: He is alert and oriented to person, place, and time.  Psychiatric:        Behavior: Behavior normal.   The plan was reviewed with the patient/family, and all questions/concerned were  addressed.  It was my pleasure to see Phillip Mcguire today and participate in his care. Please feel free to contact me with any questions or concerns.  Sincerely,  Rexene Alberts, DO Allergy & Immunology  [facility] office: [place] office: [phone]

## 2022-04-07 ENCOUNTER — Encounter: Payer: Self-pay | Admitting: Allergy

## 2022-04-07 ENCOUNTER — Other Ambulatory Visit: Payer: Self-pay | Admitting: Allergy

## 2022-04-07 ENCOUNTER — Other Ambulatory Visit: Payer: Self-pay

## 2022-04-07 ENCOUNTER — Ambulatory Visit (INDEPENDENT_AMBULATORY_CARE_PROVIDER_SITE_OTHER): Payer: BC Managed Care – PPO | Admitting: Allergy

## 2022-04-07 VITALS — BP 116/62 | HR 65 | Temp 98.0°F | Resp 20 | Ht 72.0 in | Wt 226.0 lb

## 2022-04-07 DIAGNOSIS — J4530 Mild persistent asthma, uncomplicated: Secondary | ICD-10-CM | POA: Diagnosis not present

## 2022-04-07 DIAGNOSIS — J3089 Other allergic rhinitis: Secondary | ICD-10-CM

## 2022-04-07 DIAGNOSIS — R21 Rash and other nonspecific skin eruption: Secondary | ICD-10-CM

## 2022-04-07 DIAGNOSIS — L219 Seborrheic dermatitis, unspecified: Secondary | ICD-10-CM | POA: Diagnosis not present

## 2022-04-07 DIAGNOSIS — R42 Dizziness and giddiness: Secondary | ICD-10-CM

## 2022-04-07 MED ORDER — LEVALBUTEROL TARTRATE 45 MCG/ACT IN AERO: 2.0000 | INHALATION_SPRAY | RESPIRATORY_TRACT | 2 refills | Status: DC | PRN

## 2022-04-07 MED ORDER — KETOCONAZOLE 1 % EX SHAM: MEDICATED_SHAMPOO | CUTANEOUS | 0 refills | Status: DC

## 2022-04-07 MED ORDER — ARNUITY ELLIPTA 100 MCG/ACT IN AEPB: 1.0000 | INHALATION_SPRAY | Freq: Every day | RESPIRATORY_TRACT | 2 refills | Status: DC

## 2022-04-07 NOTE — Telephone Encounter (Signed)
Would you like to switch to Pulmicort from Lasara?

## 2022-04-07 NOTE — Assessment & Plan Note (Signed)
Rhino conjunctivitis symptoms x few months. Tried OTC antihistamines, Flonase and eye drops with some benefit. Follows with ENT. No issues with the eyes last year.  Today's skin testing showed: Positive to mold.  Start environmental control measures as below. Discussed with patient I'm not convinced that the above allergens are causing his symptoms.  Use over the counter antihistamines such as Zyrtec (cetirizine), Claritin (loratadine), Allegra (fexofenadine), or Xyzal (levocetirizine) daily as needed. May take twice a day during allergy flares. May switch antihistamines every few months. Use Flonase (fluticasone) nasal spray 1 spray per nostril twice a day as needed for nasal congestion.  Nasal saline spray (i.e., Simply Saline) or nasal saline lavage (i.e., NeilMed) is recommended as needed and prior to medicated nasal sprays. May use refresh eye drops. Avoid eye drops that state get the red out.  Recommend repeat eye evaluation.

## 2022-04-07 NOTE — Assessment & Plan Note (Signed)
Pruritic rash on the scalp x 2 months. No specific triggers noted. Similar issue in the past which cleared up with some type of topical treatment in the past. Possible seborrheic dermatitis.  Use ketoconazole shampoo - apply to scalp 2-3 times per week for 1 month.  Use over the counter antihistamines such as Zyrtec (cetirizine), Claritin (loratadine), Allegra (fexofenadine), or Xyzal (levocetirizine) daily as needed for the itching.  If symptoms are not controlled or causes drowsiness let us know. See below for proper skin care. If no improvement will try dermasmoothe scalp oil next and will refer him to see his dermatologist.

## 2022-04-07 NOTE — Assessment & Plan Note (Signed)
Used albuterol during Covid-19. No prior asthma diagnosis. Complaining of dizziness and follows with cards (paf) and pulm (osa).  Today's spirometry showed: No overt abnormalities noted given today's efforts with 32% improvement in FEV1 post bronchodilator treatment. Clinically feeling improved.  This is concerning for new onset asthma.  Daily controller medication(s): start Arnuity 124mcg 1 puff once a day and rinse mouth after each use. Demonstrated proper use.  Will start with single ICS inhaler and levoalbuterol prn due to his history of afib.  May use levoalbuterol rescue inhaler 2 puffs every 4 to 6 hours as needed for shortness of breath, chest tightness, coughing, and wheezing. Monitor frequency of use.  Get spirometry at next visit.

## 2022-04-07 NOTE — Patient Instructions (Addendum)
Today's skin testing showed: Positive to mold.  Negative to common foods.   Results given.  Scalp  Use ketoconazole shampoo - apply to scalp 2-3 times per week for 1 month.  Use over the counter antihistamines such as Zyrtec (cetirizine), Claritin (loratadine), Allegra (fexofenadine), or Xyzal (levocetirizine) daily as needed for the itching.  If symptoms are not controlled or causes drowsiness let us know. Avoid the following potential triggers: alcohol, tight clothing, NSAIDs, hot showers and getting overheated. See below for proper skin care.   Eyes May use refresh eye drops. Avoid eye drops that state get the red out.  Recommend that you see your eye doctor for a follow up visit.  Dizziness Keep follow up with your cardiologist.  Breathing Your breathing test results are concerning for asthma.  Daily controller medication(s): start Arnuity 138mcg 1 puff once a day and rinse mouth after each use.  May use levoalbuterol rescue inhaler 2 puffs every 4 to 6 hours as needed for shortness of breath, chest tightness, coughing, and wheezing. Monitor frequency of use.  Breathing control goals:  Full participation in all desired activities (may need albuterol before activity) Albuterol use two times or less a week on average (not counting use with activity) Cough interfering with sleep two times or less a month Oral steroids no more than once a year No hospitalizations   Environmental allergies Start environmental control measures as below. Use over the counter antihistamines such as Zyrtec (cetirizine), Claritin (loratadine), Allegra (fexofenadine), or Xyzal (levocetirizine) daily as needed. May take twice a day during allergy flares. May switch antihistamines every few months. Use Flonase (fluticasone) nasal spray 1 spray per nostril twice a day as needed for nasal congestion.  Nasal saline spray (i.e., Simply Saline) or nasal saline lavage (i.e., NeilMed) is recommended as needed and  prior to medicated nasal sprays.  Follow up in 1 months or sooner if needed.  Mold Control Mold and fungi can grow on a variety of surfaces provided certain temperature and moisture conditions exist.  Outdoor molds grow on plants, decaying vegetation and soil. The major outdoor mold, Alternaria and Cladosporium, are found in very high numbers during hot and dry conditions. Generally, a late summer - fall peak is seen for common outdoor fungal spores. Rain will temporarily lower outdoor mold spore count, but counts rise rapidly when the rainy period ends. The most important indoor molds are Aspergillus and Penicillium. Dark, humid and poorly ventilated basements are ideal sites for mold growth. The next most common sites of mold growth are the bathroom and the kitchen. Outdoor (Seasonal) Mold Control Use air conditioning and keep windows closed. Avoid exposure to decaying vegetation. Avoid leaf raking. Avoid grain handling. Consider wearing a face mask if working in moldy areas.  Indoor (Perennial) Mold Control  Maintain humidity below 50%. Get rid of mold growth on hard surfaces with water, detergent and, if necessary, 5% bleach (do not mix with other cleaners). Then dry the area completely. If mold covers an area more than 10 square feet, consider hiring an indoor environmental professional. For clothing, washing with soap and water is best. If moldy items cannot be cleaned and dried, throw them away. Remove sources e.g. contaminated carpets. Repair and seal leaking roofs or pipes. Using dehumidifiers in damp basements may be helpful, but empty the water and clean units regularly to prevent mildew from forming. All rooms, especially basements, bathrooms and kitchens, require ventilation and cleaning to deter mold and mildew growth. Avoid carpeting on concrete  or damp floors, and storing items in damp areas.   Skin care recommendations  Bath time: Always use lukewarm water. AVOID very hot or  cold water. Keep bathing time to 5-10 minutes. Do NOT use bubble bath. Use a mild soap and use just enough to wash the dirty areas. Do NOT scrub skin vigorously.  After bathing, pat dry your skin with a towel. Do NOT rub or scrub the skin.  Moisturizers and prescriptions:  ALWAYS apply moisturizers immediately after bathing (within 3 minutes). This helps to lock-in moisture. Use the moisturizer several times a day over the whole body. Good summer moisturizers include: Aveeno, CeraVe, Cetaphil. Good winter moisturizers include: Aquaphor, Vaseline, Cerave, Cetaphil, Eucerin, Vanicream. When using moisturizers along with medications, the moisturizer should be applied about one hour after applying the medication to prevent diluting effect of the medication or moisturize around where you applied the medications. When not using medications, the moisturizer can be continued twice daily as maintenance.  Laundry and clothing: Avoid laundry products with added color or perfumes. Use unscented hypo-allergenic laundry products such as Tide free, Cheer free & gentle, and All free and clear.  If the skin still seems dry or sensitive, you can try double-rinsing the clothes. Avoid tight or scratchy clothing such as wool. Do not use fabric softeners or dyer sheets.

## 2022-04-07 NOTE — Assessment & Plan Note (Signed)
Keep follow up with cardiology. Monitor symptoms.

## 2022-04-14 ENCOUNTER — Ambulatory Visit: Payer: BC Managed Care – PPO | Admitting: Family Medicine

## 2022-04-14 VITALS — BP 116/74 | HR 57 | Temp 98.1°F | Ht 72.0 in | Wt 219.0 lb

## 2022-04-14 DIAGNOSIS — R0609 Other forms of dyspnea: Secondary | ICD-10-CM | POA: Diagnosis not present

## 2022-04-14 DIAGNOSIS — J3089 Other allergic rhinitis: Secondary | ICD-10-CM | POA: Diagnosis not present

## 2022-04-14 LAB — HM DIABETES EYE EXAM

## 2022-04-14 NOTE — Progress Notes (Signed)
   Subjective:    Patient ID: Phillip Mcguire, male    DOB: 1955-10-29, 67 y.o.   MRN: [id number]  HPI Patient here discuss recent referrals that have been done from allergist to eye doctor  Patient relates that he has been seen by eye specialist and also seen by He relates that his eyes crusted up in the morning.  Give him some trouble.  Also at times he feels like his vision is not good.  He states that the retina specialist did a bunch test and told him everything seems to be okay.  He is uncertain if they did do a peripheral vision test  He also relates a lot of shortness of breath with activity especially walking up hills or inclines or doing any vigorous work.  Recent testing by cardiology showed no sign of ischemia but did show slightly depressed ejection fraction he has an echo coming he denies swelling in the legs denies PND  Patient was also seen by allergist Review of Systems     Objective:   Physical Exam  General-in no acute distress Eyes-no discharge Lungs-respiratory rate normal, CTA CV-no murmurs,RRR Extremities skin warm dry no edema Neuro grossly normal Behavior normal, alert       Assessment & Plan:  Allergic rhinitis issues-try Atrovent nasal spray along with Astelin and Flonase.  In addition to this allergy tablet  Allergy eyedrops are typically not covered by insurance but over-the-counter are-recommendOpcon A  As for the DOE I would recommend the patient follow through with seeing cardiologist after the echo.  If ejection fraction is decreased may benefit from being on other medicines.  Keep all regular follow-up visits

## 2022-04-20 ENCOUNTER — Ambulatory Visit (HOSPITAL_COMMUNITY): Payer: BC Managed Care – PPO | Attending: Nurse Practitioner

## 2022-04-20 DIAGNOSIS — I700 Atherosclerosis of aorta: Secondary | ICD-10-CM | POA: Diagnosis not present

## 2022-04-20 DIAGNOSIS — I480 Paroxysmal atrial fibrillation: Secondary | ICD-10-CM

## 2022-04-20 DIAGNOSIS — I2510 Atherosclerotic heart disease of native coronary artery without angina pectoris: Secondary | ICD-10-CM | POA: Diagnosis not present

## 2022-04-20 LAB — ECHOCARDIOGRAM COMPLETE
Area-P 1/2: 3.4 cm2
S' Lateral: 4.2 cm

## 2022-04-21 ENCOUNTER — Telehealth: Payer: Self-pay

## 2022-04-21 NOTE — Telephone Encounter (Addendum)
Reviewed results with patient who verbalized understanding and thanked me for the call.    ----- Message from Gaston Islam., NP sent at 04/21/2022  7:34 AM EDT ----- Please let Mr. Phillip Mcguire know that his echo report shows normal heart pumping and squeeze function.  There were no significant changes overall from your previous echocardiogram.  These results are reassuring that your shortness of breath and dizziness is not associated with any structural heart changes.  Please let me know if have any additional questions.  Robin Searing, NP

## 2022-05-03 ENCOUNTER — Other Ambulatory Visit: Payer: Self-pay | Admitting: Allergy

## 2022-05-07 ENCOUNTER — Encounter: Payer: Self-pay | Admitting: *Deleted

## 2022-05-11 NOTE — Progress Notes (Unsigned)
Follow Up Note  RE: Phillip Mcguire MRN: [id number] DOB: February 25, 1955 Date of Office Visit: 05/12/2022  Referring provider: Babs Sciara, MD Primary care provider: Babs Sciara, MD  Chief Complaint: No chief complaint on file.  History of Present Illness: I had the pleasure of seeing Phillip Mcguire for a follow up visit at the [facility] on 05/11/2022. He is a 67 y.o. male, who is being followed for allergic rhinitis, asthma, seborrheic dermatitis. His previous allergy office visit was on 04/07/2022 with Dr. Selena Batten. Today is a regular follow up visit.  Other allergic rhinitis Rhino conjunctivitis symptoms x few months. Tried OTC antihistamines, Flonase and eye drops with some benefit. Follows with ENT. No issues with the eyes last year.  Today's skin testing showed: Positive to mold.  Start environmental control measures as below. Discussed with patient I'm not convinced that the above allergens are causing his symptoms.  Use over the counter antihistamines such as Zyrtec (cetirizine), Claritin (loratadine), Allegra (fexofenadine), or Xyzal (levocetirizine) daily as needed. May take twice a day during allergy flares. May switch antihistamines every few months. Use Flonase (fluticasone) nasal spray 1 spray per nostril twice a day as needed for nasal congestion.  Nasal saline spray (i.e., Simply Saline) or nasal saline lavage (i.e., NeilMed) is recommended as needed and prior to medicated nasal sprays. May use refresh eye drops. Avoid eye drops that state get the red out.  Recommend repeat eye evaluation.    Seborrheic dermatitis Pruritic rash on the scalp x 2 months. No specific triggers noted. Similar issue in the past which cleared up with some type of topical treatment in the past. Possible seborrheic dermatitis.  Use ketoconazole shampoo - apply to scalp 2-3 times per week for 1 month.  Use over the counter antihistamines such as Zyrtec (cetirizine), Claritin  (loratadine), Allegra (fexofenadine), or Xyzal (levocetirizine) daily as needed for the itching.  If symptoms are not controlled or causes drowsiness let us know. See below for proper skin care. If no improvement will try dermasmoothe scalp oil next and will refer him to see his dermatologist.    Mild persistent asthma without complication Used albuterol during Covid-19. No prior asthma diagnosis. Complaining of dizziness and follows with cards (paf) and pulm (osa).  Today's spirometry showed: No overt abnormalities noted given today's efforts with 32% improvement in FEV1 post bronchodilator treatment. Clinically feeling improved.  This is concerning for new onset asthma.  Daily controller medication(s): start Arnuity 1 puff once a day and rinse mouth after each use. Demonstrated proper use.  Will start with single ICS inhaler and levoalbuterol prn due to his history of afib.  May use levoalbuterol rescue inhaler 2 puffs every 4 to 6 hours as needed for shortness of breath, chest tightness, coughing, and wheezing. Monitor frequency of use.  Get spirometry at next visit.   Dizziness Keep follow up with cardiology. Monitor symptoms.  Assessment and Plan: Phillip Mcguire is a 67 y.o. male with: No problem-specific Assessment & Plan notes found for this encounter.  No follow-ups on file.  No orders of the defined types were placed in this encounter.  Lab Orders  No laboratory test(s) ordered today    Diagnostics: Spirometry:  Tracings reviewed. His effort: {Blank single:19197::"Good reproducible efforts.","It was hard to get consistent efforts and there is a question as to whether this reflects a maximal maneuver.","Poor effort, data can not be interpreted."} FVC: ***L FEV1: ***L, ***% predicted FEV1/FVC ratio: ***% Interpretation: {Blank  single:19197::"Spirometry consistent with mild obstructive disease","Spirometry consistent with moderate obstructive disease","Spirometry consistent  with severe obstructive disease","Spirometry consistent with possible restrictive disease","Spirometry consistent with mixed obstructive and restrictive disease","Spirometry uninterpretable due to technique","Spirometry consistent with normal pattern","No overt abnormalities noted given today's efforts"}.  Please see scanned spirometry results for details.  Skin Testing: {Blank single:19197::"Select foods","Environmental allergy panel","Environmental allergy panel and select foods","Food allergy panel","None","Deferred due to recent antihistamines use"}. *** Results discussed with patient/family.   Medication List:  Current Outpatient Medications  Medication Sig Dispense Refill  . aspirin EC 81 MG tablet Take 1 tablet (81 mg total) by mouth daily. Swallow whole. 90 tablet 3  . atorvastatin (LIPITOR) 20 MG tablet TAKE 1 TABLET BY MOUTH EVERY DAY 90 tablet 0  . azelastine (ASTELIN) 0.1 % nasal spray Place 1 spray into both nostrils 2 (two) times daily. Use in each nostril as directed 30 mL 12  . budesonide (PULMICORT) 180 MCG/ACT inhaler Inhale 1 puff into the lungs in the morning and at bedtime. Rinse mouth after each use. 1 each 3  . CALCIUM-VITAMIN D PO Take 1 tablet by mouth daily. 200 units calcium, 1100units vit D    . Cholecalciferol (VITAMIN D) 2000 units CAPS Take 2,000 Units by mouth daily.    . fluticasone (FLONASE) 50 MCG/ACT nasal spray Place 1 spray into both nostrils daily.    Marland Kitchen KETOCONAZOLE, TOPICAL, 1 % SHAM Apply to scalp 2-3 times per week for 1 month. 200 mL 0  . levalbuterol (XOPENEX HFA) 45 MCG/ACT inhaler Inhale 2 puffs into the lungs every 4 (four) hours as needed for wheezing or shortness of breath (coughing fits). 1 each 2  . loratadine (CLARITIN) 10 MG tablet Take 10 mg by mouth daily.    . Magnesium Oxide 200 MG TABS Take 200 mg by mouth daily.    . Multiple Vitamin (MULTIVITAMIN) capsule Take 1 capsule by mouth daily.    Marland Kitchen omeprazole (PRILOSEC) 20 MG capsule Take 20  mg by mouth daily.     Marland Kitchen OVER THE COUNTER MEDICATION Take 1 tablet by mouth daily. Zinc    . Probiotic Product (PROBIOTIC PO) Take 1 Dose by mouth daily. 300mg     . vitamin C (ASCORBIC ACID) 500 MG tablet Take 500 mg by mouth daily.     Current Facility-Administered Medications  Medication Dose Route Frequency Provider Last Rate Last Admin  . triamcinolone acetonide (KENALOG-40) injection 20 mg  20 mg Other Once Bushnell, Max T, [place]       Allergies: Allergies  Allergen Reactions  . Gabapentin     drowsy   . Latex Rash   I reviewed his past medical history, social history, family history, and environmental history and no significant changes have been reported from his previous visit.  Review of Systems  Constitutional:  Negative for appetite change, chills, fever and unexpected weight change.  HENT:  Negative for congestion and rhinorrhea.   Eyes:  Positive for itching.       Bluriness  Respiratory:  Positive for chest tightness. Negative for cough, shortness of breath and wheezing.   Cardiovascular:  Negative for chest pain.  Gastrointestinal:  Negative for abdominal pain.  Genitourinary:  Negative for difficulty urinating.  Skin:  Positive for rash.  Allergic/Immunologic: Positive for environmental allergies. Negative for food allergies.  Neurological:  Positive for headaches.   Objective: There were no vitals taken for this visit. There is no height or weight on file to calculate BMI. Physical Exam Vitals and nursing note  reviewed.  Constitutional:      Appearance: Normal appearance. He is well-developed.  HENT:     Head: Normocephalic and atraumatic.     Right Ear: Tympanic membrane and external ear normal.     Left Ear: Tympanic membrane and external ear normal.     Nose: Nose normal.     Mouth/Throat:     Mouth: Mucous membranes are moist.     Pharynx: Oropharynx is clear.  Eyes:     Conjunctiva/sclera: Conjunctivae normal.  Cardiovascular:     Rate and Rhythm:  Normal rate and regular rhythm.     Heart sounds: Normal heart sounds. No murmur heard.    No friction rub. No gallop.  Pulmonary:     Effort: Pulmonary effort is normal.     Breath sounds: Normal breath sounds. No wheezing, rhonchi or rales.  Musculoskeletal:     Cervical back: Neck supple.  Skin:    General: Skin is warm.     Findings: Rash present.     Comments: Erythematous bumps on the scalp.   Neurological:     Mental Status: He is alert and oriented to person, place, and time.  Psychiatric:        Behavior: Behavior normal.  Previous notes and tests were reviewed. The plan was reviewed with the patient/family, and all questions/concerned were addressed.  It was my pleasure to see Armstrong today and participate in his care. Please feel free to contact me with any questions or concerns.  Sincerely,  Wyline Mood, DO Allergy & Immunology  [facility] office: [phone] [facility] office: [phone]

## 2022-05-12 ENCOUNTER — Other Ambulatory Visit: Payer: Self-pay

## 2022-05-12 ENCOUNTER — Ambulatory Visit (INDEPENDENT_AMBULATORY_CARE_PROVIDER_SITE_OTHER): Payer: BC Managed Care – PPO | Admitting: Allergy

## 2022-05-12 ENCOUNTER — Encounter: Payer: Self-pay | Admitting: Allergy

## 2022-05-12 VITALS — BP 110/68 | HR 67 | Temp 98.3°F | Resp 16

## 2022-05-12 DIAGNOSIS — R42 Dizziness and giddiness: Secondary | ICD-10-CM

## 2022-05-12 DIAGNOSIS — J4530 Mild persistent asthma, uncomplicated: Secondary | ICD-10-CM | POA: Diagnosis not present

## 2022-05-12 DIAGNOSIS — L219 Seborrheic dermatitis, unspecified: Secondary | ICD-10-CM | POA: Diagnosis not present

## 2022-05-12 DIAGNOSIS — J3089 Other allergic rhinitis: Secondary | ICD-10-CM | POA: Diagnosis not present

## 2022-05-12 MED ORDER — FLUOCINOLONE ACETONIDE SCALP 0.01 % EX OIL: TOPICAL_OIL | CUTANEOUS | 1 refills | Status: AC

## 2022-05-12 NOTE — Assessment & Plan Note (Signed)
Past history - Pruritic rash on the scalp x 2 months. No specific triggers noted. Similar issue in the past which cleared up with some type of topical treatment in the past. Interim history - ketoconazole shampoo helps but it come back when not using it.  Use dermasmoothe oil leave on overnight or >4h before washing it off. Only use as needed.  Use over the counter antihistamines such as Zyrtec (cetirizine), Claritin (loratadine), Allegra (fexofenadine), or Xyzal (levocetirizine) daily as needed for the itching.  Avoid the following potential triggers: alcohol, tight clothing, NSAIDs, hot showers and getting overheated. Continue proper skin care.  Recommend dermatology evaluation next.

## 2022-05-12 NOTE — Assessment & Plan Note (Signed)
Past history - rhino conjunctivitis symptoms x few months. Tried OTC antihistamines, Flonase and eye drops with some benefit. Follows with ENT. No issues with the eyes last year. 2024 skin testing showed: Positive to mold.  Interim history - saw his eye doctor and now using 2 different eye drops with some benefit.  Continue environmental control measures as below. Use over the counter antihistamines such as Zyrtec (cetirizine), Claritin (loratadine), Allegra (fexofenadine), or Xyzal (levocetirizine) daily as needed. May take twice a day during allergy flares. May switch antihistamines every few months. Use Flonase (fluticasone) nasal spray 1 spray per nostril twice a day as needed for nasal congestion.  Nasal saline spray (i.e., Simply Saline) or nasal saline lavage (i.e., NeilMed) is recommended as needed and prior to medicated nasal sprays. Continue eye drops as prescribed by eye doctor.

## 2022-05-12 NOTE — Assessment & Plan Note (Signed)
Unchanged. Etiology unclear.  Keep follow up with your cardiologist.

## 2022-05-12 NOTE — Assessment & Plan Note (Signed)
Past history - Used albuterol during Covid-19. No prior asthma diagnosis. Complaining of dizziness and follows with cards (paf) and pulm (osa).  2024 spirometry showed: No overt abnormalities noted given today's efforts with 32% improvement in FEV1 post bronchodilator treatment. Clinically feeling improved.  Interim history - some improvement in breathing with Pulmicort but dizziness is unchanged. Arnuity not covered. Today's spirometry showed some restriction. Daily controller medication(s): continue Pulmicort 1 puff twice a day and rinse mouth after each use.  May use levoalbuterol rescue inhaler 2 puffs every 4 to 6 hours as needed for shortness of breath, chest tightness, coughing, and wheezing. Monitor frequency of use.

## 2022-05-12 NOTE — Patient Instructions (Addendum)
Scalp  Use dermasmoothe oil leave on overnight or >4h before washing it off. Only use as needed.  Use over the counter antihistamines such as Zyrtec (cetirizine), Claritin (loratadine), Allegra (fexofenadine), or Xyzal (levocetirizine) daily as needed for the itching.  Avoid the following potential triggers: alcohol, tight clothing, NSAIDs, hot showers and getting overheated. Continue proper skin care.  Recommend dermatology evaluation next.   Dizziness Keep follow up with your cardiologist. I'm not sure what's causing your dizziness.   Breathing Daily controller medication(s): continue Pulmicort 1 puff twice a day and rinse mouth after each use.  May use levoalbuterol rescue inhaler 2 puffs every 4 to 6 hours as needed for shortness of breath, chest tightness, coughing, and wheezing. Monitor frequency of use.  Breathing control goals:  Full participation in all desired activities (may need albuterol before activity) Albuterol use two times or less a week on average (not counting use with activity) Cough interfering with sleep two times or less a month Oral steroids no more than once a year No hospitalizations   Environmental allergies 2024 skin testing showed: Positive to mold.  Continue environmental control measures as below. Use over the counter antihistamines such as Zyrtec (cetirizine), Claritin (loratadine), Allegra (fexofenadine), or Xyzal (levocetirizine) daily as needed. May take twice a day during allergy flares. May switch antihistamines every few months. Use Flonase (fluticasone) nasal spray 1 spray per nostril twice a day as needed for nasal congestion.  Nasal saline spray (i.e., Simply Saline) or nasal saline lavage (i.e., NeilMed) is recommended as needed and prior to medicated nasal sprays.  Follow up in 3 months or sooner if needed.  Mold Control Mold and fungi can grow on a variety of surfaces provided certain temperature and moisture conditions exist.   Outdoor molds grow on plants, decaying vegetation and soil. The major outdoor mold, Alternaria and Cladosporium, are found in very high numbers during hot and dry conditions. Generally, a late summer - fall peak is seen for common outdoor fungal spores. Rain will temporarily lower outdoor mold spore count, but counts rise rapidly when the rainy period ends. The most important indoor molds are Aspergillus and Penicillium. Dark, humid and poorly ventilated basements are ideal sites for mold growth. The next most common sites of mold growth are the bathroom and the kitchen. Outdoor (Seasonal) Mold Control Use air conditioning and keep windows closed. Avoid exposure to decaying vegetation. Avoid leaf raking. Avoid grain handling. Consider wearing a face mask if working in moldy areas.  Indoor (Perennial) Mold Control  Maintain humidity below 50%. Get rid of mold growth on hard surfaces with water, detergent and, if necessary, 5% bleach (do not mix with other cleaners). Then dry the area completely. If mold covers an area more than 10 square feet, consider hiring an indoor environmental professional. For clothing, washing with soap and water is best. If moldy items cannot be cleaned and dried, throw them away. Remove sources e.g. contaminated carpets. Repair and seal leaking roofs or pipes. Using dehumidifiers in damp basements may be helpful, but empty the water and clean units regularly to prevent mildew from forming. All rooms, especially basements, bathrooms and kitchens, require ventilation and cleaning to deter mold and mildew growth. Avoid carpeting on concrete or damp floors, and storing items in damp areas.   Skin care recommendations  Bath time: Always use lukewarm water. AVOID very hot or cold water. Keep bathing time to 5-10 minutes. Do NOT use bubble bath. Use a mild soap and use just  enough to wash the dirty areas. Do NOT scrub skin vigorously.  After bathing, pat dry your skin  with a towel. Do NOT rub or scrub the skin.  Moisturizers and prescriptions:  ALWAYS apply moisturizers immediately after bathing (within 3 minutes). This helps to lock-in moisture. Use the moisturizer several times a day over the whole body. Good summer moisturizers include: Aveeno, CeraVe, Cetaphil. Good winter moisturizers include: Aquaphor, Vaseline, Cerave, Cetaphil, Eucerin, Vanicream. When using moisturizers along with medications, the moisturizer should be applied about one hour after applying the medication to prevent diluting effect of the medication or moisturize around where you applied the medications. When not using medications, the moisturizer can be continued twice daily as maintenance.  Laundry and clothing: Avoid laundry products with added color or perfumes. Use unscented hypo-allergenic laundry products such as Tide free, Cheer free & gentle, and All free and clear.  If the skin still seems dry or sensitive, you can try double-rinsing the clothes. Avoid tight or scratchy clothing such as wool. Do not use fabric softeners or dyer sheets.

## 2022-05-14 ENCOUNTER — Telehealth: Payer: Self-pay

## 2022-05-14 NOTE — Telephone Encounter (Signed)
Pt is calling he went to the ey doctor and said the doctor had seen some problems and was sending information to Dr Lorin Picket and pt is wanting to know what he needs to do next?   Phillip Mcguire [phone]

## 2022-05-14 NOTE — Progress Notes (Signed)
Cardiology Office Note:    Date:  05/15/2022   ID:  Phillip Mcguire, DOB [birth year], MRN [id number]  PCP:  Babs Sciara, MD   [facility]  Cardiologist:  Christell Constant, MD  Advanced Practice Provider:  No care team member to display Electrophysiologist:  None      CC: AF follow up  History of Present Illness:    Phillip Mcguire is a 67 y.o. male with a hx of OSA, Atrial Fibrillation, Borderline aortic dilation who presents for evaluation 03/21/20. Started on ASA since last visit.  Had normal CCTA.  Seen in 2023. 2023: Had normal stress test, and LVEF has improved.  Patient notes that he is doing OK.   Since last visit notes he is light-headed in the morning. HE has some visual deficit and there are concerns from this ophthalmologist that he has had had a stroke.   There are no interval hospital/ED visit.    He is still having light headedness.   Has seen allergy, retina, and ophthalmology- was told he has a small stroke.  Has headache and was told it may be cardiac in origin.  Rare chest pain with hiccoughs.  No SOB/DOE and no PND/Orthopnea.  No weight gain or leg swelling.  No palpitations.    Past Medical History:  Diagnosis Date   Abnormal LFTs    mild elevation of total, indirect and direct bilirubin   Allergy    Congenital absence of kidney    Right   GERD (gastroesophageal reflux disease)    Hearing loss    Sleep apnea     Past Surgical History:  Procedure Laterality Date   COLONOSCOPY  2007   Rourk: normal   COLONOSCOPY N/A 12/26/2015   Procedure: COLONOSCOPY;  Surgeon: Corbin Ade, MD;  [facility];  Service: Endoscopy;  Laterality: N/A;  2:00 PM   ESOPHAGOGASTRODUODENOSCOPY  01/2002   Dr. Arlyce Dice: Normal   NAILBED REPAIR Right 06/14/2018   Procedure: RECONSTRUCTION NAILBED WITH PALMARIS GRAFT RIGHT THUMB;  Surgeon: Cindee Salt, MD;  Location: [facility];  Service: Orthopedics;  Laterality: Right;   AXILLARY BLOCK   NASAL SEPTUM SURGERY  1980's   otosclerosis     bilateral ear surgery   SHOULDER ARTHROSCOPY W/ ROTATOR CUFF REPAIR Left 2003   titanium implant Left March 2010   left middle ear    Current Medications: Current Meds  Medication Sig   apixaban (ELIQUIS) 5 MG TABS tablet Take 1 tablet (5 mg total) by mouth 2 (two) times daily.   apixaban (ELIQUIS) 5 MG TABS tablet Take 1 tablet (5 mg total) by mouth 2 (two) times daily.   atorvastatin (LIPITOR) 20 MG tablet TAKE 1 TABLET BY MOUTH EVERY DAY   azelastine (ASTELIN) 0.1 % nasal spray Place 1 spray into both nostrils 2 (two) times daily. Use in each nostril as directed   budesonide (PULMICORT) 180 MCG/ACT inhaler Inhale 1 puff into the lungs in the morning and at bedtime. Rinse mouth after each use.   CALCIUM-VITAMIN D PO Take 1 tablet by mouth daily. 200 units calcium, 1100units vit D   Cholecalciferol (VITAMIN D) 2000 units CAPS Take 2,000 Units by mouth daily.   Fluocinolone Acetonide Scalp (DERMA-SMOOTHE/FS SCALP) 0.01 % OIL leave on overnight or >4h before washing it off. Only use as needed.   fluticasone (FLONASE) 50 MCG/ACT nasal spray Place 1 spray into both nostrils daily.   ketoconazole (NIZORAL) 2 % shampoo  Apply 1 Application topically 2 (two) times a week.   latanoprost (XALATAN) 0.005 % ophthalmic solution SMARTSIG:1 Drop(s) In Eye(s) Every Evening   levalbuterol (XOPENEX HFA) 45 MCG/ACT inhaler Inhale 2 puffs into the lungs every 4 (four) hours as needed for wheezing or shortness of breath (coughing fits).   loratadine (CLARITIN) 10 MG tablet Take 10 mg by mouth daily.   Magnesium Oxide 200 MG TABS Take 200 mg by mouth daily.   Multiple Vitamin (MULTIVITAMIN) capsule Take 1 capsule by mouth daily.   omeprazole (PRILOSEC) 20 MG capsule Take 20 mg by mouth daily.    OVER THE COUNTER MEDICATION Take 1 tablet by mouth daily. Zinc   Probiotic Product (PROBIOTIC PO) Take 1 Dose by mouth daily. 300mg    vitamin C  (ASCORBIC ACID) 500 MG tablet Take 500 mg by mouth daily.   [DISCONTINUED] aspirin EC 81 MG tablet Take 1 tablet (81 mg total) by mouth daily. Swallow whole.   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    Comment: [employer]  Tobacco Use   Smoking status: Former    Packs/day: 1.50    Years: 15.00    Additional pack years: 0.00    Total pack years: 22.50    Types: Cigarettes    Quit date: 01/13/1988    Years since quitting: 34.3   Smokeless tobacco: Former  Building services engineer Use: Never used  Substance and Sexual Activity   Alcohol use: Yes    Alcohol/week: 0.0 standard drinks of alcohol    Comment: rarely drink beer   Drug use: No   Sexual activity: Yes    Partners: Female  Other Topics Concern   Not on file  Social History Narrative   He works in Production designer, theatre/television/film, Engineer, building services   Lives with wife.     Caffeine use: 2 cups coffee per day   Social Determinants of Health   Financial Resource Strain: Not on file  Food Insecurity: Not on file  Transportation Needs: Not on file  Physical Activity: Not on file  Stress: Not on file  Social Connections: Not on file     Family History: The patient's family history includes Allergies in his son; Cancer in his maternal aunt, maternal uncle, paternal aunt, and paternal uncle; Diabetes in his father, mother, and sister; Heart disease in his father and mother; Hyperlipidemia in his father and mother; Hypertension in his mother; Lung cancer in his father. There is no history of Neuropathy. History of coronary  artery disease notable for father. History of heart failure notable for no members. History of arrhythmia notable for mother (needed defibrillator NOS). Denies family history of sudden cardiac death including drowning, car accidents, or unexplained deaths in the family. No history of bicuspid aortic valve or aortic aneurysm or dissection.   Grandmother had some type of aneurysm (may have been brain).   ROS:   Please see the history of present illness.     All other systems reviewed and are negative.  EKGs/Labs/Other Studies Reviewed:    The following  studies were reviewed today:  EKG:   05/15/22: SR rate 63 08/13/20:  SR rate 60 WNL 03/21/20: SR rate 60 WNL  Cardiac Studies & Procedures     STRESS TESTS  MYOCARDIAL PERFUSION IMAGING 03/23/2022  Narrative   The study is normal. The study is low risk.   No ST deviation was noted.   Nuclear stress EF: 54 %. The left ventricular ejection fraction is mildly decreased (45-54%). End diastolic cavity size is normal.   ECHOCARDIOGRAM  ECHOCARDIOGRAM COMPLETE 04/20/2022  Narrative ECHOCARDIOGRAM REPORT    Patient Name:   Phillip Mcguire Date of Exam: 04/20/2022 Medical Rec #:  [id number]      Height:       72.0 in Accession #:    [id number]     Weight:       219.0 lb Date of Birth:  1955-08-22      BSA:          2.214 m Patient Age:    66 years       BP:           116/74 mmHg Patient Gender: M              HR:           64 bpm. Exam Location:  [address]  Procedure: 2D Echo, Cardiac Doppler and Color Doppler  Indications:    I48.0 Pqaroxysmal atrial fibrillation  History:        Patient has prior history of Echocardiogram examinations, most recent 03/24/2021. Arrythmias:Paroxysmal atrial fibrillation.  Sonographer:    Samule Ohm RDCS Referring Phys: [phone] [place], JR DICK  IMPRESSIONS   1. Left ventricular ejection fraction, by estimation, is 60 to 65%. The left ventricle has normal function. The left ventricle has no  regional wall motion abnormalities. Left ventricular diastolic parameters were normal. 2. Right ventricular systolic function is normal. The right ventricular size is normal. 3. The mitral valve is normal in structure. Trivial mitral valve regurgitation. No evidence of mitral stenosis. 4. The aortic valve is tricuspid. Aortic valve regurgitation is not visualized. No aortic stenosis is present. 5. The inferior vena cava is normal in size with greater than 50% respiratory variability, suggesting right atrial pressure of 3 mmHg.  Comparison(s): No significant change from prior study. Prior images reviewed side by side.  FINDINGS Left Ventricle: Left ventricular ejection fraction, by estimation, is 60 to 65%. The left ventricle has normal function. The left ventricle has no regional wall motion abnormalities. The left ventricular internal cavity size was normal in size. There is no left ventricular hypertrophy. Left ventricular diastolic parameters were normal.  Right Ventricle: The right ventricular size is normal. No increase in right ventricular wall thickness. Right ventricular systolic function is normal.  Left Atrium: Left atrial size was normal in size.  Right Atrium: Right atrial size was normal in size.  Pericardium: There is no evidence of pericardial effusion.  Mitral Valve: The mitral valve is normal in structure. Trivial mitral valve regurgitation. No evidence of mitral valve stenosis.  Tricuspid Valve: The tricuspid valve is normal in structure. Tricuspid valve regurgitation is trivial. No evidence of tricuspid stenosis.  Aortic Valve: The aortic valve is tricuspid. Aortic valve regurgitation is not visualized. No aortic stenosis is present.  Pulmonic Valve: The pulmonic valve was normal in structure. Pulmonic valve regurgitation is trivial. No evidence of pulmonic stenosis.  Aorta: The aortic root is normal in size and structure.  Venous: The inferior vena cava is  normal in  size with greater than 50% respiratory variability, suggesting right atrial pressure of 3 mmHg.  IAS/Shunts: No atrial level shunt detected by color flow Doppler.   LEFT VENTRICLE PLAX 2D LVIDd:         6.10 cm   Diastology LVIDs:         4.20 cm   LV e' medial:    8.70 cm/s LV PW:         0.80 cm   LV E/e' medial:  8.3 LV IVS:        0.90 cm   LV e' lateral:   14.60 cm/s LVOT diam:     2.10 cm   LV E/e' lateral: 5.0 LV SV:         87 LV SV Index:   39 LVOT Area:     3.46 cm   RIGHT VENTRICLE             IVC RV S prime:     16.00 cm/s  IVC diam: 1.40 cm TAPSE (M-mode): 2.3 cm RVSP:           28.4 mmHg  LEFT ATRIUM             Index        RIGHT ATRIUM           Index LA diam:        4.40 cm 1.99 cm/m   RA Pressure: 3.00 mmHg LA Vol (A2C):   65.8 ml 29.72 ml/m  RA Area:     16.90 cm LA Vol (A4C):   46.5 ml 21.00 ml/m  RA Volume:   47.20 ml  21.32 ml/m LA Biplane Vol: 57.0 ml 25.74 ml/m AORTIC VALVE LVOT Vmax:   125.00 cm/s LVOT Vmean:  78.500 cm/s LVOT VTI:    0.250 m  AORTA Ao Root diam: 3.80 cm Ao Asc diam:  3.20 cm  MITRAL VALVE               TRICUSPID VALVE MV Area (PHT): 3.40 cm    TR Peak grad:   25.4 mmHg MV Decel Time: 223 msec    TR Vmax:        252.00 cm/s MV E velocity: 72.30 cm/s  Estimated RAP:  3.00 mmHg MV A velocity: 63.40 cm/s  RVSP:           28.4 mmHg MV E/A ratio:  1.14 SHUNTS Systemic VTI:  0.25 m Systemic Diam: 2.10 cm  Donato Schultz MD Electronically signed by Donato Schultz MD Signature Date/Time: 04/20/2022/9:49:13 PM    Final    MONITORS  LONG TERM MONITOR (3-14 DAYS) 10/12/2021  Narrative   Patient had a minimum heart rate of 48 bpm, maximum heart rate of 148 bpm, and average heart rate of 72 bpm.Predominant underlying rhythm was sinus rhythm.   Paroxysmal SVT (one episode) lasting 4 beats at longest.   Isolated PACs were rare (<1.0%).   Isolated PVCs were rare (<1.0%).   Triggered and diary events associated with sinus  rhythm.  No malignant arrhythmias.   CT SCANS  CT CORONARY MORPH W/CTA COR W/SCORE 04/10/2020  Addendum 04/10/2020  3:59 PM ADDENDUM REPORT: 04/10/2020 15:57  CLINICAL DATA:  14M with DOE  EXAM: Cardiac/Coronary CTA  TECHNIQUE: The patient was scanned on a [employer].  FINDINGS: A 100 kV prospective scan was triggered in the descending thoracic aorta at 111 HU's. Axial non-contrast 3 mm slices were carried out through the heart. The data set  was analyzed on a dedicated work station and scored using the [employer] . Gantry rotation speed was 250 msecs and collimation was .6 mm. 0.8 mg of sl NTG was given. The 3D data set was reconstructed in 5% intervals of the 35-75 % of the R-R cycle. Phases were analyzed on a dedicated work station using MPR, MIP and VRT modes. The patient received 80 cc of contrast.  Coronary Arteries:  Normal coronary origin.  Right dominance.  RCA is a large dominant artery that gives rise to PDA and PLA. There is no plaque.  Left main is a large artery that gives rise to LAD and LCX arteries.  LAD is a large vessel. Noncalcified plaque in the ostial LAD causes minimal (0-24%) stenosis  LCX is a non-dominant artery that gives rise to one large OM1 branch. There is no plaque.  Other findings:  Left Ventricle: Mild dilatation  Left Atrium: Mild enlargement  Pulmonary Veins: Normal configuration  Right Ventricle: Mild dilatation  Right Atrium: Mild enlargement  Cardiac valves: No calcifications  Thoracic aorta: Normal size  Pulmonary Arteries: Normal size  Systemic Veins: Normal drainage  Pericardium: Normal thickness  IMPRESSION: 1. Coronary calcium score of 0.  2. Normal coronary origin with right dominance.  3. Nonobstructive CAD with noncalcified plaque causing minimal (0-24%) stenosis in ostial LAD  CAD-RADS 1. Minimal non-obstructive CAD (0-24%). Consider non-atherosclerotic causes of chest pain. Consider  preventive therapy and risk factor modification.   Electronically Signed By: Epifanio Lesches MD On: 04/10/2020 15:57  Narrative EXAM: OVER-READ INTERPRETATION  CT CHEST  The following report is an over-read performed by radiologist Dr. Trudie Reed of [facility] on 04/10/2020. This over-read does not include interpretation of cardiac or coronary anatomy or pathology. The coronary calcium score/coronary CTA interpretation by the cardiologist is attached.  COMPARISON:  Chest CT 01/03/2009.  FINDINGS: Atherosclerotic calcifications in the thoracic aorta. Within the visualized portions of the thorax there are no suspicious appearing pulmonary nodules or masses, there is no acute consolidative airspace disease, no pleural effusions, no pneumothorax and no lymphadenopathy. Visualized portions of the upper abdomen are unremarkable. There are no aggressive appearing lytic or blastic lesions noted in the visualized portions of the skeleton.  IMPRESSION: 1.  Aortic Atherosclerosis (ICD10-I70.0).  Electronically Signed: By: Trudie Reed M.D. On: 04/10/2020 14:59            Recent Labs: 10/02/2021: ALT 25; BUN 16; Creatinine, Ser 1.05; Potassium 4.2; Sodium 139  Recent Lipid Panel    Component Value Date/Time   CHOL 127 10/02/2021 0826   TRIG 61 10/02/2021 0826   HDL 51 10/02/2021 0826   CHOLHDL 2.5 10/02/2021 0826   CHOLHDL 4.1 07/24/2013 0804   VLDL 25 07/24/2013 0804   LDLCALC 63 10/02/2021 0826    Risk Assessment/Calculations:    CHA2DS2-VASc Score = 1  This indicates a 0.6% annual risk of stroke. The patient's score is based upon: CHF History: 0 HTN History: 0 Diabetes History: 0 Stroke History: 0 Vascular Disease History: 0 Age Score: 1 Gender Score: 0      Physical Exam:    VS:  BP 108/60   Pulse 63   Ht 6' (1.829 m)   Wt 220 lb (99.8 kg)   SpO2 96%   BMI 29.84 kg/m     Wt Readings from Last 3 Encounters:  05/15/22  220 lb (99.8 kg)  04/14/22 219 lb (99.3 kg)  04/07/22 226 lb (102.5 kg)    Gen: no  distress   Neck: No JVD Cardiac: No Rubs or Gallops, no murmur, regular rhythm, +2 radial pulses Respiratory: Clear to auscultation bilaterally, normal effort, normal  respiratory rate GI: Soft, nontender, non-distended  MS: No  edema;  moves all extremities Integument: Skin feels warm Neuro:  At time of evaluation, alert and oriented to person/place/time/situation  Psych: Normal affect, patient feels ok   ASSESSMENT:    1. PAF (paroxysmal atrial fibrillation) (HCC)   2. OSA (obstructive sleep apnea)   3. Coronary artery disease involving native heart without angina pectoris, unspecified vessel or lesion type   4. Dizziness     PLAN:     Paroxysmal Atrial Fibrillation  - query of prior stroke (left eye, isolated) - CHA2DS2-VASc=3, HASBLED=2 - Give query of stroke, I would stop his ASA and start eliquis - CBC in two weeks - answered questions about DOAC - will get monitor for AF burden; I do not suspect his symptoms are cardiac  Minimal non obstructive CAD - with HLD and aortic atherosclerosis - LDL at goal on current therapy  Aorta at ULN - not planned for yearly monitoring  Alden Server in 6 months  Me in one year           Medication Adjustments/Labs and Tests Ordered: Current medicines are reviewed at length with the patient today.  Concerns regarding medicines are outlined above.  Orders Placed This Encounter  Procedures   CBC   LONG TERM MONITOR (3-14 DAYS)   EKG 12-Lead    Meds ordered this encounter  Medications   apixaban (ELIQUIS) 5 MG TABS tablet    Sig: Take 1 tablet (5 mg total) by mouth 2 (two) times daily.    Dispense:  180 tablet    Refill:  3   apixaban (ELIQUIS) 5 MG TABS tablet    Sig: Take 1 tablet (5 mg total) by mouth 2 (two) times daily.    Dispense:  56 tablet    Refill:  0     There are no Patient Instructions on file for this visit.    Signed, Christell Constant, MD  05/15/2022 10:15 AM    [facility]

## 2022-05-14 NOTE — Telephone Encounter (Signed)
Nurses-it is quite possible that this came through in the multitudes of papers that come my way and has been signed off on in the past week.  Most of these are ordered as scan into the system.  But unfortunately it can take weeks for the scanners to put it back into the system so therefore I do not have access to this report in front of me. Needless to say I need those papers in front of me in order to be intelligent and my response-therefore find out what I Dr. He saw him please make sure a copy of this is sent to [place] and forwarded directly to me then we can reach out to him  Please inform the patient that we are requesting an a cup copy from eye doctor to be sent here thank you-obviously work with patient to make sure we get the right eye doctor etc. etc.

## 2022-05-15 ENCOUNTER — Encounter: Payer: Self-pay | Admitting: Internal Medicine

## 2022-05-15 ENCOUNTER — Ambulatory Visit: Payer: BC Managed Care – PPO | Attending: Internal Medicine | Admitting: Internal Medicine

## 2022-05-15 VITALS — BP 108/60 | HR 63 | Ht 72.0 in | Wt 220.0 lb

## 2022-05-15 DIAGNOSIS — G4733 Obstructive sleep apnea (adult) (pediatric): Secondary | ICD-10-CM

## 2022-05-15 DIAGNOSIS — I2510 Atherosclerotic heart disease of native coronary artery without angina pectoris: Secondary | ICD-10-CM

## 2022-05-15 DIAGNOSIS — R42 Dizziness and giddiness: Secondary | ICD-10-CM

## 2022-05-15 DIAGNOSIS — I480 Paroxysmal atrial fibrillation: Secondary | ICD-10-CM | POA: Diagnosis not present

## 2022-05-15 MED ORDER — APIXABAN 5 MG PO TABS: 5.0000 mg | ORAL_TABLET | Freq: Two times a day (BID) | ORAL | 3 refills | Status: DC

## 2022-05-15 MED ORDER — APIXABAN 5 MG PO TABS: 5.0000 mg | ORAL_TABLET | Freq: Two times a day (BID) | ORAL | 0 refills | Status: DC

## 2022-05-15 NOTE — Telephone Encounter (Signed)
Called and spoke with patient and got correct eye doctor [facility] phone [phone] fax [phone]) Record request form faxed.

## 2022-05-15 NOTE — Patient Instructions (Addendum)
Medication Instructions:  Your physician has recommended you make the following change in your medication:  STOP: Aspirin  START: apixaban (Eliquis) 5 mg by mouth twice daily.    We have provided you with 28 days of samples, a 30 day free trial card and information for patient assistance.   *If you need a refill on your cardiac medications before your next appointment, please call your pharmacy*   Lab Work: IN 2 WEEKS: CBC  If you have labs (blood work) drawn today and your tests are completely normal, you will receive your results only by: MyChart Message (if you have MyChart) OR A paper copy in the mail If you have any lab test that is abnormal or we need to change your treatment, we will call you to review the results.   Testing/Procedures: Your physician has recommended that you wear a heart monitor.    Follow-Up: At [facility], you and your health needs are our priority.  As part of our continuing mission to provide you with exceptional heart care, we have created designated Provider Care Teams.  These Care Teams include your primary Cardiologist (physician) and Advanced Practice Providers (APPs -  Physician Assistants and Nurse Practitioners) who all work together to provide you with the care you need, when you need it.  We recommend signing up for the patient portal called "MyChart".  Sign up information is provided on this After Visit Summary.  MyChart is used to connect with patients for Virtual Visits (Telemedicine).  Patients are able to view lab/test results, encounter notes, upcoming appointments, etc.  Non-urgent messages can be sent to your provider as well.   To learn more about what you can do with MyChart, go to [email or web address].    Your next appointment:   6 month(s)  Provider:   Robin Searing, NP     Then, Christell Constant, MD will plan to see you again in 1 year(s).    Other Instructions ZIO XT- Long Term Monitor Instructions  Your  physician has requested you wear a ZIO patch monitor for 7 days.  This is a single patch monitor. [employer] supplies one patch monitor per enrollment. Additional stickers are not available. Please do not apply patch if you will be having a Nuclear Stress Test,  Cardiac CT, MRI, or Chest Xray during the period you would be wearing the  monitor. The patch cannot be worn during these tests. You cannot remove and re-apply the  ZIO XT patch monitor.  Your ZIO patch monitor will be mailed 3 day USPS to your address on file. It may take 3-5 days  to receive your monitor after you have been enrolled.  Once you have received your monitor, please review the enclosed instructions. Your monitor  has already been registered assigning a specific monitor serial # to you.  Billing and Patient Assistance Program Information  We have supplied [employer] with any of your insurance information on file for billing purposes. [employer] offers a sliding scale Patient Assistance Program for patients that do not have  insurance, or whose insurance does not completely cover the cost of the ZIO monitor.  You must apply for the Patient Assistance Program to qualify for this discounted rate.  To apply, please call [employer] at [phone], select option 4, select option 2, ask to apply for  Patient Assistance Program. Meredeth Ide will ask your household income, and how many people  are in your household. They will quote your out-of-pocket cost based on that information.  [employer] will also be able to set up a 49-month, interest-free payment plan if needed.  Applying the monitor   Shave hair from upper left chest.  Hold abrader disc by orange tab. Rub abrader in 40 strokes over the upper left chest as  indicated in your monitor instructions.  Clean area with 4 enclosed alcohol pads. Let dry.  Apply patch as indicated in monitor instructions. Patch will be placed under collarbone on left  side of chest with arrow pointing upward.   Rub patch adhesive wings for 2 minutes. Remove white label marked "1". Remove the white  label marked "2". Rub patch adhesive wings for 2 additional minutes.  While looking in a mirror, press and release button in center of patch. A small green light will  flash 3-4 times. This will be your only indicator that the monitor has been turned on.  Do not shower for the first 24 hours. You may shower after the first 24 hours.  Press the button if you feel a symptom. You will hear a small click. Record Date, Time and  Symptom in the Patient Logbook.  When you are ready to remove the patch, follow instructions on the last 2 pages of Patient  Logbook. Stick patch monitor onto the last page of Patient Logbook.  Place Patient Logbook in the blue and white box. Use locking tab on box and tape box closed  securely. The blue and white box has prepaid postage on it. Please place it in the mailbox as  soon as possible. Your physician should have your test results approximately 7 days after the  monitor has been mailed back to [facility].  Call [facility] Customer Care at [phone] if you have questions regarding  your ZIO XT patch monitor. Call them immediately if you see an orange light blinking on your  monitor.  If your monitor falls off in less than 4 days, contact our Monitor department at [phone].  If your monitor becomes loose or falls off after 4 days call [employer] at [phone] for  suggestions on securing your monitor

## 2022-05-18 ENCOUNTER — Ambulatory Visit: Payer: BC Managed Care – PPO | Attending: Internal Medicine

## 2022-05-18 DIAGNOSIS — I480 Paroxysmal atrial fibrillation: Secondary | ICD-10-CM

## 2022-05-18 NOTE — Progress Notes (Unsigned)
Enrolled for [employer] to mail a ZIO XT long term holter monitor to the patients address on file.  

## 2022-05-24 DIAGNOSIS — I480 Paroxysmal atrial fibrillation: Secondary | ICD-10-CM | POA: Diagnosis not present

## 2022-05-29 ENCOUNTER — Encounter: Payer: Self-pay | Admitting: Family Medicine

## 2022-05-29 NOTE — Progress Notes (Signed)
Front staff-please send this letter to Dr. Coralee North ophthalmologist with [facility].  [place].  Thank you

## 2022-06-01 ENCOUNTER — Ambulatory Visit: Payer: BC Managed Care – PPO | Attending: Internal Medicine

## 2022-06-01 DIAGNOSIS — I480 Paroxysmal atrial fibrillation: Secondary | ICD-10-CM

## 2022-06-02 LAB — CBC
Hematocrit: 44.8 % (ref 37.5–51.0)
Hemoglobin: 14.8 g/dL (ref 13.0–17.7)
MCH: 30 pg (ref 26.6–33.0)
MCHC: 33 g/dL (ref 31.5–35.7)
MCV: 91 fL (ref 79–97)
Platelets: 261 10*3/uL (ref 150–450)
RBC: 4.94 x10E6/uL (ref 4.14–5.80)
RDW: 13.1 % (ref 11.6–15.4)
WBC: 8.1 10*3/uL (ref 3.4–10.8)

## 2022-06-04 ENCOUNTER — Other Ambulatory Visit: Payer: Self-pay | Admitting: Family Medicine

## 2022-06-04 ENCOUNTER — Telehealth: Payer: Self-pay

## 2022-06-04 NOTE — Telephone Encounter (Signed)
Patient aware of lab results. He would like to know if he needs to continue on Eliquis. Sent to provider through result management

## 2022-06-04 NOTE — Telephone Encounter (Signed)
-----   Message from Christell Constant, MD sent at 06/03/2022  1:19 PM EDT ----- Testing Results WNL.  No change to diagnostic or therapeutic plan.

## 2022-06-16 ENCOUNTER — Ambulatory Visit: Payer: BC Managed Care – PPO | Admitting: Family Medicine

## 2022-06-16 VITALS — BP 136/70 | HR 75 | Ht 72.0 in | Wt 219.0 lb

## 2022-06-16 DIAGNOSIS — R519 Headache, unspecified: Secondary | ICD-10-CM

## 2022-06-16 DIAGNOSIS — H40053 Ocular hypertension, bilateral: Secondary | ICD-10-CM | POA: Diagnosis not present

## 2022-06-16 DIAGNOSIS — H469 Unspecified optic neuritis: Secondary | ICD-10-CM

## 2022-06-16 DIAGNOSIS — L719 Rosacea, unspecified: Secondary | ICD-10-CM | POA: Diagnosis not present

## 2022-06-16 MED ORDER — METRONIDAZOLE 0.75 % EX CREA: TOPICAL_CREAM | Freq: Two times a day (BID) | CUTANEOUS | 1 refills | Status: AC

## 2022-06-16 MED ORDER — SILDENAFIL CITRATE 100 MG PO TABS: 100.0000 mg | ORAL_TABLET | Freq: Every day | ORAL | 2 refills | Status: DC | PRN

## 2022-06-16 NOTE — Progress Notes (Signed)
   Subjective:    Patient ID: Phillip Mcguire, male    DOB: 01-01-1956, 67 y.o.   MRN: [id number]  Headache  This is a new problem. The current episode started more than 1 month ago. The problem occurs daily. The problem has been waxing and waning. The pain is located in the Frontal region. The pain does not radiate. The pain quality is not similar to prior headaches. The quality of the pain is described as band-like and throbbing. The pain is at a severity of 8/10. The pain is moderate. Associated symptoms include nausea and a visual change. Pertinent negatives include no abdominal pain, abnormal behavior, facial sweating, fever, insomnia or neck pain. The symptoms are aggravated by activity. He has tried acetaminophen for the symptoms. The treatment provided mild relief. There is no history of cancer, cluster headaches, migraine headaches, migraines in the family, obesity or recent head traumas.   Patient arrives today having headaches  See above He describes frontal headaches across the frontal sinuses almost every day starts midmorning and typically has to take Tylenol and finally gets better goes away    has a place on the right side of his nose.  He states he has multiple bumps on his nose as he is getting older they get red he seemed to get infected and get sore  Review of Systems  Constitutional:  Negative for fever.  Gastrointestinal:  Positive for nausea. Negative for abdominal pain.  Musculoskeletal:  Negative for neck pain.  Neurological:  Positive for headaches.  Psychiatric/Behavioral:  The patient does not have insomnia.        Objective:   Physical Exam General-in no acute distress Eyes-no discharge Lungs-respiratory rate normal, CTA CV-no murmurs,RRR Extremities skin warm dry no edema Neuro grossly normal Behavior normal, alert EOMI Pupils equal Finger to nose normal Visual field impaired left eye on lateral area and not in the right eye       Assessment &  Plan:  1. Frequent headaches Frontal Given eye issues will pursue work up-patient has not had these headaches until the past several months.  They do cause some nausea with the main visual disturbance.  It is reasonable to do an MRI to rule out more serious pathology - MR Brain W Wo Contrast; Future  2. Optic neuropathy Noted by opthomology Neurologist note reveals optic neuropathy patient describes visual deficit on the lateral portion of his left eye I find no evidence of a central stroke but with his elevated ocular pressure neuropathy issues as well as some vascular changes on the eye exam the ophthalmologist is requesting MRI will 3. Bilateral ocular hypertension Using drops Increased pressure  4. Acne rosacea Metronidazole cream apply thin amount twice daily Referral to derm FU OV by fall

## 2022-06-17 NOTE — Addendum Note (Signed)
Addended by: Margaretha Sheffield on: 06/17/2022 08:34 AM   Modules accepted: Orders

## 2022-06-29 ENCOUNTER — Ambulatory Visit (HOSPITAL_COMMUNITY)
Admission: RE | Admit: 2022-06-29 | Discharge: 2022-06-29 | Disposition: A | Discharge status: Home / Self Care | Payer: BC Managed Care – PPO | Source: Ambulatory Visit | Attending: Family Medicine | Admitting: Family Medicine

## 2022-06-29 DIAGNOSIS — R519 Headache, unspecified: Secondary | ICD-10-CM | POA: Diagnosis present

## 2022-06-29 MED ORDER — GADOBUTROL 1 MMOL/ML IV SOLN
10.0000 mL | Freq: Once | INTRAVENOUS | Status: AC | PRN
  Administered 2022-06-29: 10 mL via INTRAVENOUS

## 2022-08-11 ENCOUNTER — Ambulatory Visit: Payer: BC Managed Care – PPO | Admitting: Allergy

## 2022-08-24 NOTE — Progress Notes (Deleted)
Follow Up Note  RE: Phillip Mcguire MRN: [id number] DOB: 1955/05/09 Date of Office Visit: 08/25/2022  Referring provider: Babs Sciara, MD Primary care provider: Babs Sciara, MD  Chief Complaint: No chief complaint on file.  History of Present Illness: I had the pleasure of seeing Phillip Mcguire for a follow up visit at the [facility] on 08/24/2022. He is a 67 y.o. male, who is being followed for asthma, allergic rhinitis, seborrheic dermatitis. His previous allergy office visit was on 05/12/2022 with Dr. Selena Batten. Today is a regular follow up visit.  Mild persistent asthma without complication Past history - Used albuterol during Covid-19. No prior asthma diagnosis. Complaining of dizziness and follows with cards (paf) and pulm (osa).  2024 spirometry showed: No overt abnormalities noted given today's efforts with 32% improvement in FEV1 post bronchodilator treatment. Clinically feeling improved.  Interim history - some improvement in breathing with Pulmicort but dizziness is unchanged. Arnuity not covered. Today's spirometry showed some restriction. Daily controller medication(s): continue Pulmicort 1 puff twice a day and rinse mouth after each use.  May use levoalbuterol rescue inhaler 2 puffs every 4 to 6 hours as needed for shortness of breath, chest tightness, coughing, and wheezing. Monitor frequency of use.    Other allergic rhinitis Past history - rhino conjunctivitis symptoms x few months. Tried OTC antihistamines, Flonase and eye drops with some benefit. Follows with ENT. No issues with the eyes last year. 2024 skin testing showed: Positive to mold.  Interim history - saw his eye doctor and now using 2 different eye drops with some benefit.  Continue environmental control measures as below. Use over the counter antihistamines such as Zyrtec (cetirizine), Claritin (loratadine), Allegra (fexofenadine), or Xyzal (levocetirizine) daily as needed. May take  twice a day during allergy flares. May switch antihistamines every few months. Use Flonase (fluticasone) nasal spray 1 spray per nostril twice a day as needed for nasal congestion.  Nasal saline spray (i.e., Simply Saline) or nasal saline lavage (i.e., NeilMed) is recommended as needed and prior to medicated nasal sprays. Continue eye drops as prescribed by eye doctor.   Seborrheic dermatitis Past history - Pruritic rash on the scalp x 2 months. No specific triggers noted. Similar issue in the past which cleared up with some type of topical treatment in the past. Interim history - ketoconazole shampoo helps but it come back when not using it.  Use dermasmoothe oil leave on overnight or >4h before washing it off. Only use as needed.  Use over the counter antihistamines such as Zyrtec (cetirizine), Claritin (loratadine), Allegra (fexofenadine), or Xyzal (levocetirizine) daily as needed for the itching.  Avoid the following potential triggers: alcohol, tight clothing, NSAIDs, hot showers and getting overheated. Continue proper skin care.  Recommend dermatology evaluation next.    Dizziness Unchanged. Etiology unclear.  Keep follow up with your cardiologist.  Assessment and Plan: Phillip Mcguire is a 67 y.o. male with: ***  No follow-ups on file.  No orders of the defined types were placed in this encounter.  Lab Orders  No laboratory test(s) ordered today    Diagnostics: Spirometry:  Tracings reviewed. His effort: {Blank single:19197::"Good reproducible efforts.","It was hard to get consistent efforts and there is a question as to whether this reflects a maximal maneuver.","Poor effort, data can not be interpreted."} FVC: ***L FEV1: ***L, ***% predicted FEV1/FVC ratio: ***% Interpretation: {Blank single:19197::"Spirometry consistent with mild obstructive disease","Spirometry consistent with moderate obstructive disease","Spirometry consistent with severe obstructive disease","Spirometry  consistent with possible restrictive disease","Spirometry consistent with mixed obstructive and restrictive disease","Spirometry uninterpretable due to technique","Spirometry consistent with normal pattern","No overt abnormalities noted given today's efforts"}.  Please see scanned spirometry results for details.  Skin Testing: {Blank single:19197::"Select foods","Environmental allergy panel","Environmental allergy panel and select foods","Food allergy panel","None","Deferred due to recent antihistamines use"}. *** Results discussed with patient/family.   Medication List:  Current Outpatient Medications  Medication Sig Dispense Refill   apixaban (ELIQUIS) 5 MG TABS tablet Take 1 tablet (5 mg total) by mouth 2 (two) times daily. 180 tablet 3   apixaban (ELIQUIS) 5 MG TABS tablet Take 1 tablet (5 mg total) by mouth 2 (two) times daily. 56 tablet 0   atorvastatin (LIPITOR) 20 MG tablet TAKE 1 TABLET BY MOUTH EVERY DAY 90 tablet 1   azelastine (ASTELIN) 0.1 % nasal spray Place 1 spray into both nostrils 2 (two) times daily. Use in each nostril as directed 30 mL 12   budesonide (PULMICORT) 180 MCG/ACT inhaler Inhale 1 puff into the lungs in the morning and at bedtime. Rinse mouth after each use. 1 each 3   CALCIUM-VITAMIN D PO Take 1 tablet by mouth daily. 200 units calcium, 1100units vit D     Cholecalciferol (VITAMIN D) 2000 units CAPS Take 2,000 Units by mouth daily.     Fluocinolone Acetonide Scalp (DERMA-SMOOTHE/FS SCALP) 0.01 % OIL leave on overnight or >4h before washing it off. Only use as needed. 118 mL 1   fluticasone (FLONASE) 50 MCG/ACT nasal spray Place 1 spray into both nostrils daily.     ketoconazole (NIZORAL) 2 % shampoo Apply 1 Application topically 2 (two) times a week.     latanoprost (XALATAN) 0.005 % ophthalmic solution SMARTSIG:1 Drop(s) In Eye(s) Every Evening     levalbuterol (XOPENEX HFA) 45 MCG/ACT inhaler Inhale 2 puffs into the lungs every 4 (four) hours as needed for  wheezing or shortness of breath (coughing fits). 1 each 2   loratadine (CLARITIN) 10 MG tablet Take 10 mg by mouth daily.     Magnesium Oxide 200 MG TABS Take 200 mg by mouth daily.     metroNIDAZOLE (METROCREAM) 0.75 % cream Apply topically 2 (two) times daily. 45 g 1   Multiple Vitamin (MULTIVITAMIN) capsule Take 1 capsule by mouth daily.     omeprazole (PRILOSEC) 20 MG capsule Take 20 mg by mouth daily.      OVER THE COUNTER MEDICATION Take 1 tablet by mouth daily. Zinc     Probiotic Product (PROBIOTIC PO) Take 1 Dose by mouth daily. 300mg      sildenafil (VIAGRA) 100 MG tablet Take 1 tablet (100 mg total) by mouth daily as needed for erectile dysfunction. 5 tablet 2   vitamin C (ASCORBIC ACID) 500 MG tablet Take 500 mg by mouth daily.     Current Facility-Administered Medications  Medication Dose Route Frequency Provider Last Rate Last Admin   triamcinolone acetonide (KENALOG-40) injection 20 mg  20 mg Other Once [place], Max T, [place]       Allergies: Allergies  Allergen Reactions   Gabapentin     drowsy    Latex Rash   I reviewed his past medical history, social history, family history, and environmental history and no significant changes have been reported from his previous visit.  Review of Systems  Constitutional:  Negative for appetite change, chills, fever and unexpected weight change.  HENT:  Negative for congestion and rhinorrhea.   Eyes:  Positive for itching.  Respiratory:  Negative for cough,  chest tightness, shortness of breath and wheezing.   Cardiovascular:  Negative for chest pain.  Gastrointestinal:  Negative for abdominal pain.  Genitourinary:  Negative for difficulty urinating.  Skin:  Positive for rash.  Allergic/Immunologic: Positive for environmental allergies. Negative for food allergies.  Neurological:  Positive for dizziness.    Objective: There were no vitals taken for this visit. There is no height or weight on file to calculate BMI. Physical  Exam Vitals and nursing note reviewed.  Constitutional:      Appearance: Normal appearance. He is well-developed.  HENT:     Head: Normocephalic and atraumatic.     Right Ear: Tympanic membrane and external ear normal.     Left Ear: Tympanic membrane and external ear normal.     Nose: Nose normal.     Mouth/Throat:     Mouth: Mucous membranes are moist.     Pharynx: Oropharynx is clear.  Eyes:     Conjunctiva/sclera: Conjunctivae normal.  Cardiovascular:     Rate and Rhythm: Normal rate and regular rhythm.     Heart sounds: Normal heart sounds. No murmur heard.    No friction rub. No gallop.  Pulmonary:     Effort: Pulmonary effort is normal.     Breath sounds: Normal breath sounds. No wheezing, rhonchi or rales.  Musculoskeletal:     Cervical back: Neck supple.  Skin:    General: Skin is warm.     Findings: No rash.     Comments: Scalp looks clear today.  Neurological:     Mental Status: He is alert and oriented to person, place, and time.  Psychiatric:        Behavior: Behavior normal.    Previous notes and tests were reviewed. The plan was reviewed with the patient/family, and all questions/concerned were addressed.  It was my pleasure to see Phillip Mcguire today and participate in his care. Please feel free to contact me with any questions or concerns.  Sincerely,  Wyline Mood, DO Allergy & Immunology  [facility] office: [phone] [facility] office: [phone]

## 2022-08-25 ENCOUNTER — Ambulatory Visit: Payer: Self-pay | Admitting: Allergy

## 2022-08-25 DIAGNOSIS — J309 Allergic rhinitis, unspecified: Secondary | ICD-10-CM

## 2022-08-25 DIAGNOSIS — L219 Seborrheic dermatitis, unspecified: Secondary | ICD-10-CM

## 2022-08-25 DIAGNOSIS — J4530 Mild persistent asthma, uncomplicated: Secondary | ICD-10-CM

## 2022-08-25 DIAGNOSIS — J3089 Other allergic rhinitis: Secondary | ICD-10-CM

## 2022-09-15 ENCOUNTER — Other Ambulatory Visit: Payer: Self-pay | Admitting: Family Medicine

## 2022-09-30 ENCOUNTER — Ambulatory Visit: Payer: BC Managed Care – PPO | Admitting: Internal Medicine

## 2022-10-15 ENCOUNTER — Encounter: Payer: Self-pay | Admitting: Nurse Practitioner

## 2022-10-15 ENCOUNTER — Ambulatory Visit (INDEPENDENT_AMBULATORY_CARE_PROVIDER_SITE_OTHER): Payer: BC Managed Care – PPO | Admitting: Nurse Practitioner

## 2022-10-15 ENCOUNTER — Ambulatory Visit (HOSPITAL_COMMUNITY): Payer: BC Managed Care – PPO

## 2022-10-15 VITALS — BP 111/71 | HR 60 | Temp 98.6°F | Ht 72.0 in | Wt 207.6 lb

## 2022-10-15 DIAGNOSIS — R197 Diarrhea, unspecified: Secondary | ICD-10-CM | POA: Insufficient documentation

## 2022-10-15 MED ORDER — DIPHENOXYLATE-ATROPINE 2.5-0.025 MG PO TABS: 2.0000 | ORAL_TABLET | Freq: Four times a day (QID) | ORAL | 0 refills | Status: DC | PRN

## 2022-10-15 NOTE — Patient Instructions (Addendum)
Start Align bowel probiotic (generic is fine). Diarrhea, Adult Diarrhea is frequent loose and sometimes watery bowel movements. Diarrhea can make you feel weak and cause you to become dehydrated. Dehydration is a condition in which there is not enough water or other fluids in the body. Dehydration can make you tired and thirsty, cause you to have a dry mouth, and decrease how often you urinate. Diarrhea typically lasts 2-3 days. However, it can last longer if it is a sign of something more serious. It is important to treat your diarrhea as told by your health care provider. Follow these instructions at home: Eating and drinking     Follow these recommendations as told by your health care provider: Take an oral rehydration solution (ORS). This is an over-the-counter medicine that helps return your body to its normal balance of nutrients and water. It is found at pharmacies and retail stores. Drink enough fluid to keep your urine pale yellow. Drink fluids such as water, diluted fruit juice, and low-calorie sports drinks. You can drink milk also, if desired. Sucking on ice chips is another way to get fluids. Avoid drinking fluids that contain a lot of sugar or caffeine, such as soda, energy drinks, and regular sports drinks. Avoid alcohol. Eat bland, easy-to-digest foods in small amounts as you are able. These foods include bananas, applesauce, rice, lean meats, toast, and crackers. Avoid spicy or fatty foods.  Medicines Take over-the-counter and prescription medicines only as told by your health care provider. If you were prescribed antibiotics, take them as told by your health care provider. Do not stop using the antibiotic even if you start to feel better. General instructions  Wash your hands often using soap and water for at least 20 seconds. If soap and water are not available, use hand sanitizer. Others in the household should wash their hands as well. Hands should be washed: After using  the toilet or changing a diaper. Before preparing, cooking, or serving food. While caring for a sick person or while visiting someone in a hospital. Rest at home while you recover. Take a warm bath to relieve any burning or pain from frequent diarrhea episodes. Watch your condition for any changes. Contact a health care provider if: You have a fever. Your diarrhea gets worse. You have new symptoms. You vomit every time you eat or drink. You feel light-headed, dizzy, or have a headache. You have muscle cramps. You have signs of dehydration, such as: Dark urine, very little urine, or no urine. Cracked lips. Dry mouth. Sunken eyes. Sleepiness. Weakness. You have bloody or black stools or stools that look like tar. You have severe pain, cramping, or bloating in your abdomen. Your skin feels cold and clammy. You feel confused. Get help right away if: You have chest pain or your heart is beating very quickly. You have trouble breathing or you are breathing very quickly. You feel extremely weak or you faint. These symptoms may be an emergency. Get help right away. Call 911. Do not wait to see if the symptoms will go away. Do not drive yourself to the hospital. This information is not intended to replace advice given to you by your health care provider. Make sure you discuss any questions you have with your health care provider. Document Revised: 06/17/2021 Document Reviewed: 06/17/2021 Elsevier Patient Education  2024 [employer].

## 2022-10-15 NOTE — Progress Notes (Signed)
   Subjective:    Patient ID: Phillip Mcguire, male    DOB: 1955-09-02, 67 y.o.   MRN: [id number]  HPI Presents for complaints of diarrhea that began 3 days ago.  Worse the second day.  No abdominal pain except for cramps just with diarrhea.  No fevers.  No blood in his stool.  Had some dark stools after taking Pepto-Bismol but otherwise normal color.  No known contacts.  No travel outside the country.  No known exposure to contaminated foods or water.  Some relief with Imodium.  Unassociated with any particular foods.  Has been eating a bland diet.  Patient was on antibiotics from May up until 2 to 3 weeks ago.   Review of Systems  Constitutional:  Negative for fever.  Respiratory:  Negative for cough, chest tightness and shortness of breath.   Cardiovascular:  Negative for chest pain.  Gastrointestinal:  Positive for abdominal pain and diarrhea. Negative for blood in stool and constipation.       Abdominal pain/cramping only with diarrhea.       Objective:   Physical Exam NAD.  Alert, oriented.  Lungs clear.  Heart regular rate rhythm.  Abdomen soft nondistended with active bowel sounds x 4; nontender to palpation.  No rebound or guarding.  No obvious masses.  Today's Vitals   10/15/22 1402  BP: 111/71  Pulse: 60  Temp: 98.6 F (37 C)  SpO2: 95%  Weight: 207 lb 9.6 oz (94.2 kg)  Height: 6' (1.829 m)   Body mass index is 28.16 kg/m.        Assessment & Plan:   Problem List Items Addressed This Visit       Other   Diarrhea - Primary   Relevant Orders   C difficile Toxins A+B W/Rflx   Diarrhea most likely related to a virus or possibly C. difficile related to long-term antibiotic use. Meds ordered this encounter  Medications   diphenoxylate-atropine (LOMOTIL) 2.5-0.025 MG tablet    Sig: Take 2 tablets by mouth 4 (four) times daily as needed for diarrhea or loose stools. Max 8 tablets per 24 hours    Dispense:  30 tablet    Refill:  0    Order Specific Question:    Supervising Provider    Answer:   Lilyan Punt A [[id number]]   Reviewed dietary measures including continuing bland diet.  Increase clear fluid intake.  Recommend daily oral probiotics. Warning signs reviewed.  Call back early next week if no improvement, sooner if worse.

## 2022-10-20 LAB — C DIFFICILE TOXINS A+B W/RFLX: C difficile Toxins A+B, EIA: NEGATIVE

## 2022-10-20 LAB — C DIFFICILE, CYTOTOXIN B

## 2022-11-22 NOTE — Progress Notes (Unsigned)
Cardiology Office Note    Patient Name: Phillip Mcguire Date of Encounter: 11/22/2022  Primary Care Provider:  Babs Sciara, MD Primary Cardiologist:  Christell Constant, MD Primary Electrophysiologist: None   Past Medical History    Past Medical History:  Diagnosis Date   Abnormal LFTs    mild elevation of total, indirect and direct bilirubin   Allergy    Congenital absence of kidney    Right   GERD (gastroesophageal reflux disease)    Hearing loss    Sleep apnea     History of Present Illness  Phillip Mcguire is a 67 y.o. male with PMH of HLD, GERD, atrial fibrillation, thoracic aortic aneurysm, sleep apnea who presents today for 76-month follow-up of atrial fibrillation and thoracic aortic aneurysm.   Mr. Sawaya was last seen 05/15/2022 by Dr. Raynelle Jan for follow-up of atrial fibrillation.  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  He was started on Eliquis and ASA was stopped. He wore an event monitor to evaluate AF burden that showed no recurrence of AF and therapies were continued.  During today's visit the patient reports he  been experiencing ongoing lightheadedness, particularly in the mornings, about an hour after waking up. The sensation is described as a "funny feeling", not quite to the point of needing to steady themselves or feeling like they are going to pass out. The patient describes it as a "rush" or "flushed" feeling, but denies any palpitations during these episodes. The patient has been trying to increase their water intake, currently consuming about two to three 20-ounce bottles of water a day. They have been prescribed eye drops and have been experiencing floaters for a long time. The patient also has a history of sleep apnea and has been using a mouthpiece, which has been working well. The patient quit smoking in the early 90s.   Review of Systems  Please see the history  of present illness.    All other systems reviewed and are otherwise negative except as noted above.  Physical Exam    Wt Readings from Last 3 Encounters:  10/15/22 207 lb 9.6 oz (94.2 kg)  06/16/22 219 lb (99.3 kg)  05/15/22 220 lb (99.8 kg)   WU:XLKGM were no vitals filed for this visit.,There is no height or weight on file to calculate BMI. GEN: Well nourished, well developed in no acute distress Neck: No JVD; No carotid bruits Pulmonary: Clear to auscultation without rales, wheezing or rhonchi  Cardiovascular: Normal rate. Regular rhythm. Normal S1. Normal S2.   Murmurs: There is no murmur.  ABDOMEN: Soft, non-tender, non-distended EXTREMITIES:  No edema; No deformity   EKG/LABS/ Recent Cardiac Studies   ECG personally reviewed by me today -none completed today  Risk Assessment/Calculations:    CHA2DS2-VASc Score = 3   This indicates a 3.2% annual risk of stroke. The patient's score is based upon: CHF History: 0 HTN History: 0 Diabetes History: 0 Stroke History: 2 Vascular Disease History: 0 Age Score: 1 Gender Score: 0         Lab Results  Component Value Date   WBC 8.1 06/01/2022   HGB 14.8 06/01/2022   HCT 44.8 06/01/2022   MCV 91 06/01/2022   PLT 261 06/01/2022   Lab Results  Component Value Date   CREATININE 1.05 10/02/2021   BUN 16 10/02/2021   NA 139 10/02/2021   K 4.2 10/02/2021   CL  98 10/02/2021   CO2 25 10/02/2021   Lab Results  Component Value Date   CHOL 127 10/02/2021   HDL 51 10/02/2021   LDLCALC 63 10/02/2021   TRIG 61 10/02/2021   CHOLHDL 2.5 10/02/2021    Lab Results  Component Value Date   HGBA1C 5.7 04/02/2017   Assessment & Plan    1.  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 -CHA2DS2-VASc Score = 3 [CHF History: 0,  HTN History: 0, Diabetes History: 0, Stroke History: 2, Vascular Disease History: 0, Age Score: 1, Gender Score: 0].  Therefore, the patient's annual risk of stroke is 3.2 %.      2.Nonobstructive coronary artery disease:  -Completed 03/2022 that was normal and low risk -Today patient reports no chest pain or angina since previous follow-up -Continue Lipitor 20 mg daily  3.Aortic atherosclerosis: -Atherosclerotic calcifications in the thoracic aorta -Patient currently on GDMT with ASA 81 mg and Lipitor 20 m  4.Hyperlipidemia: -Patient's LDL was 63 -Continue Lipitor 20 mg daily  5.Lightheadedness -Ongoing, primarily in the mornings, described as a "rush" feeling. No associated palpitations. Cardiac workup including stress test and echocardiogram were unremarkable. -Encourage increased fluid intake. -Refer to ENT for further evaluation. -Consider 30-day event monitor if symptoms persist or worsen.  Disposition: Follow-up with Christell Constant, MD or APP in 12 months    Signed, Napoleon Form, Leodis Rains, NP 11/22/2022, 3:37 PM [facility]

## 2022-11-23 ENCOUNTER — Ambulatory Visit: Payer: BC Managed Care – PPO | Attending: Nurse Practitioner | Admitting: Nurse Practitioner

## 2022-11-23 ENCOUNTER — Encounter: Payer: Self-pay | Admitting: Nurse Practitioner

## 2022-11-23 VITALS — BP 120/72 | HR 56 | Resp 16 | Ht 72.0 in | Wt 217.0 lb

## 2022-11-23 DIAGNOSIS — I700 Atherosclerosis of aorta: Secondary | ICD-10-CM

## 2022-11-23 DIAGNOSIS — E785 Hyperlipidemia, unspecified: Secondary | ICD-10-CM

## 2022-11-23 DIAGNOSIS — I480 Paroxysmal atrial fibrillation: Secondary | ICD-10-CM | POA: Diagnosis not present

## 2022-11-23 DIAGNOSIS — R42 Dizziness and giddiness: Secondary | ICD-10-CM

## 2022-11-23 DIAGNOSIS — I2510 Atherosclerotic heart disease of native coronary artery without angina pectoris: Secondary | ICD-10-CM

## 2022-11-23 NOTE — Patient Instructions (Signed)
Medication Instructions:  Your physician recommends that you continue on your current medications as directed. Please refer to the Current Medication list given to you today.  *If you need a refill on your cardiac medications before your next appointment, please call your pharmacy*   Lab Work: None ordered  If you have labs (blood work) drawn today and your tests are completely normal, you will receive your results only by: MyChart Message (if you have MyChart) OR A paper copy in the mail If you have any lab test that is abnormal or we need to change your treatment, we will call you to review the results.   Testing/Procedures: None ordered   Follow-Up: At [facility], you and your health needs are our priority.  As part of our continuing mission to provide you with exceptional heart care, we have created designated Provider Care Teams.  These Care Teams include your primary Cardiologist (physician) and Advanced Practice Providers (APPs -  Physician Assistants and Nurse Practitioners) who all work together to provide you with the care you need, when you need it.  We recommend signing up for the patient portal called "MyChart".  Sign up information is provided on this After Visit Summary.  MyChart is used to connect with patients for Virtual Visits (Telemedicine).  Patients are able to view lab/test results, encounter notes, upcoming appointments, etc.  Non-urgent messages can be sent to your provider as well.   To learn more about what you can do with MyChart, go to [email or web address].    Your next appointment:   12 month(s)  Provider:   Christell Constant, MD     Other Instructions

## 2022-12-01 ENCOUNTER — Ambulatory Visit: Payer: BC Managed Care – PPO | Admitting: Nurse Practitioner

## 2022-12-06 NOTE — Progress Notes (Unsigned)
@Patient  ID: Phillip Mcguire, male    DOB: 08/20/1955, 67 y.o.   MRN: [id number]  No chief complaint on file.   Referring provider: Babs Sciara, MD  HPI: 67 year old male, former smoker with past medical history significant for coronary artery disease, A-fib, OSA, asthma, GERD, perennial allergic rhinitis.  Previous [facility] pulmonary encounter 01/21/22- Dr. Craige Cotta  He had oral appliance made with Dr. Toni Arthurs.  He has been using this.  His sleep is some better.  No issues with using oral appliance.  He still wakes up during the course of the night.  He stopped doxepin because it didn't make a difference.  He wakes up for a few minutes and then goes back to sleep.  He feels okay otherwise during the day.  He is not having chest tightness anymore and not using any inhalers.   Obstructive sleep apnea. - he is intolerant of CPAP - he got oral appliance fitted with Dr. Irene Limbo - he will check Dr. Toni Arthurs when he is due for a follow up sleep study with his oral appliance   Sleep maintenance insomnia. - on further question his sleep pattern is actually normal - discussed setting proper expectations from sleep - proper sleep hygiene reviewed - he doesn't need any sleep aide medications at this time   Allergic rhinitis. - prn azelastine, flonase   12/07/2022- interim hx  Patient presents today for annual follow up OSA.           Allergies  Allergen Reactions   Gabapentin     drowsy    Latex Rash    Immunization History  Administered Date(s) Administered   Fluad Quad(high Dose 65+) 10/07/2020   Influenza Inj Mdck Quad With Preservative 10/11/2018, 10/13/2018   Influenza Split 11/08/2012, 10/13/2014   Influenza, High Dose Seasonal PF 08/26/2021   Influenza-Unspecified 11/03/2019   Janssen (J&J) SARS-COV-2 Vaccination 05/06/2019   PNEUMOCOCCAL CONJUGATE-20 09/25/2021   Respiratory Syncytial Virus Vaccine,Recomb Aduvanted(Arexvy) 01/19/2022   Td 07/10/2013   Zoster  Recombinant(Shingrix) 01/19/2022    Past Medical History:  Diagnosis Date   Abnormal LFTs    mild elevation of total, indirect and direct bilirubin   Allergy    Congenital absence of kidney    Right   GERD (gastroesophageal reflux disease)    Hearing loss    Sleep apnea     Tobacco History: Social History   Tobacco Use  Smoking Status Former   Current packs/day: 0.00   Average packs/day: 1.5 packs/day for 15.0 years (22.5 ttl pk-yrs)   Types: Cigarettes   Start date: 01/12/1973   Quit date: 01/13/1988   Years since quitting: 34.9  Smokeless Tobacco Former   Counseling given: Not Answered   Outpatient Medications Prior to Visit  Medication Sig Dispense Refill   apixaban (ELIQUIS) 5 MG TABS tablet Take 1 tablet (5 mg total) by mouth 2 (two) times daily. 56 tablet 0   atorvastatin (LIPITOR) 20 MG tablet TAKE 1 TABLET BY MOUTH EVERY DAY 90 tablet 1   azelastine (ASTELIN) 0.1 % nasal spray Place 1 spray into both nostrils 2 (two) times daily. Use in each nostril as directed (Patient taking differently: Place 1 spray into both nostrils as needed. Use in each nostril as directed) 30 mL 12   budesonide (PULMICORT) 180 MCG/ACT inhaler Inhale 1 puff into the lungs in the morning and at bedtime. Rinse mouth after each use. (Patient taking differently: Inhale 1 puff into the lungs as needed. Rinse mouth after each  use.) 1 each 3   CALCIUM-VITAMIN D PO Take 1 tablet by mouth daily. 200 units calcium, 1100units vit D     Cholecalciferol (VITAMIN D) 2000 units CAPS Take 2,000 Units by mouth daily.     Fluocinolone Acetonide Scalp (DERMA-SMOOTHE/FS SCALP) 0.01 % OIL leave on overnight or >4h before washing it off. Only use as needed. 118 mL 1   fluticasone (FLONASE) 50 MCG/ACT nasal spray Place 1 spray into both nostrils as needed.     ketoconazole (NIZORAL) 2 % shampoo Apply 1 Application topically as needed.     latanoprost (XALATAN) 0.005 % ophthalmic solution as needed.     levalbuterol  (XOPENEX HFA) 45 MCG/ACT inhaler Inhale 2 puffs into the lungs every 4 (four) hours as needed for wheezing or shortness of breath (coughing fits). (Patient taking differently: Inhale 2 puffs into the lungs as needed for wheezing or shortness of breath (coughing fits).) 1 each 2   loratadine (CLARITIN) 10 MG tablet Take 10 mg by mouth daily.     Magnesium Oxide 200 MG TABS Take 200 mg by mouth daily.     metroNIDAZOLE (METROCREAM) 0.75 % cream Apply topically 2 (two) times daily. (Patient taking differently: Apply topically as needed.) 45 g 1   Multiple Vitamin (MULTIVITAMIN) capsule Take 1 capsule by mouth daily.     omeprazole (PRILOSEC) 20 MG capsule Take 20 mg by mouth daily.      OVER THE COUNTER MEDICATION Take 1 tablet by mouth daily. Zinc     Probiotic Product (PROBIOTIC PO) Take 1 Dose by mouth daily. 300mg      sildenafil (VIAGRA) 100 MG tablet Take 1 tablet (100 mg total) by mouth daily as needed for erectile dysfunction. 5 tablet 2   vitamin C (ASCORBIC ACID) 500 MG tablet Take 500 mg by mouth daily.     Facility-Administered Medications Prior to Visit  Medication Dose Route Frequency Provider Last Rate Last Admin   triamcinolone acetonide (KENALOG-40) injection 20 mg  20 mg Other Once Fernando Salinas, Max T, DPM          Review of Systems  Review of Systems   Physical Exam  There were no vitals taken for this visit. Physical Exam   Lab Results:  CBC    Component Value Date/Time   WBC 8.1 06/01/2022 1401   WBC 7.8 12/15/2019 1540   RBC 4.94 06/01/2022 1401   RBC 5.24 12/15/2019 1540   HGB 14.8 06/01/2022 1401   HCT 44.8 06/01/2022 1401   PLT 261 06/01/2022 1401   MCV 91 06/01/2022 1401   MCH 30.0 06/01/2022 1401   MCH 30.0 12/15/2019 1540   MCHC 33.0 06/01/2022 1401   MCHC 33.6 12/15/2019 1540   RDW 13.1 06/01/2022 1401   LYMPHSABS 2.9 06/05/2019 1212   MONOABS 0.6 10/13/2012 1657   EOSABS 0.1 06/05/2019 1212   BASOSABS 0.1 06/05/2019 1212    BMET    Component  Value Date/Time   NA 139 10/02/2021 0826   K 4.2 10/02/2021 0826   CL 98 10/02/2021 0826   CO2 25 10/02/2021 0826   GLUCOSE 100 (H) 10/02/2021 0826   GLUCOSE 166 (H) 12/15/2019 1540   BUN 16 10/02/2021 0826   CREATININE 1.05 10/02/2021 0826   CALCIUM 10.0 10/02/2021 0826   GFRNONAA 84 01/02/2020 0943   GFRNONAA >60 12/15/2019 1540   GFRAA 97 01/02/2020 0943    BNP No results found for: "BNP"  ProBNP No results found for: "PROBNP"  Imaging: No results found.  Assessment & Plan:   No problem-specific Assessment & Plan notes found for this encounter.     Glenford Bayley, NP 12/06/2022

## 2022-12-07 ENCOUNTER — Encounter: Payer: Self-pay | Admitting: Primary Care

## 2022-12-07 ENCOUNTER — Ambulatory Visit: Payer: BC Managed Care – PPO | Admitting: Primary Care

## 2022-12-07 ENCOUNTER — Telehealth: Payer: Self-pay | Admitting: Primary Care

## 2022-12-07 VITALS — BP 130/82 | HR 65 | Ht 72.0 in | Wt 221.0 lb

## 2022-12-07 DIAGNOSIS — G4733 Obstructive sleep apnea (adult) (pediatric): Secondary | ICD-10-CM

## 2022-12-07 NOTE — Patient Instructions (Signed)
-  SLEEP APNEA: Sleep apnea is a condition where your breathing stops and starts repeatedly during sleep. You are using an oral appliance to help with this, and we will check with Dr. Toni Arthurs to see if a follow-up sleep study has been done. If not, we will arrange for a home sleep study to see how well the appliance is working. Please continue using the oral appliance every night.  -INSOMNIA: Insomnia is difficulty falling or staying asleep. Your sleep initiation has improved, and you are not taking any medication for it. Please continue your current sleep hygiene practices. If your sleep quality worsens or you are interested, we can discuss potential sleep aids.  -ATRIAL FIBRILLATION: Atrial fibrillation is an irregular and often rapid heart rate that can increase your risk of strokes, heart failure, and other heart-related complications. It is also a risk factor for sleep apnea. We will continue to monitor your sleep apnea closely because of this increased risk.  INSTRUCTIONS: Please follow up in 1 year unless your sleep symptoms worsen.

## 2022-12-08 ENCOUNTER — Encounter: Payer: Self-pay | Admitting: Family Medicine

## 2022-12-08 ENCOUNTER — Ambulatory Visit (INDEPENDENT_AMBULATORY_CARE_PROVIDER_SITE_OTHER): Payer: BC Managed Care – PPO | Admitting: Family Medicine

## 2022-12-08 VITALS — BP 120/62 | HR 68 | Temp 98.1°F | Ht 72.0 in | Wt 219.0 lb

## 2022-12-08 DIAGNOSIS — Z0001 Encounter for general adult medical examination with abnormal findings: Secondary | ICD-10-CM

## 2022-12-08 DIAGNOSIS — Z23 Encounter for immunization: Secondary | ICD-10-CM

## 2022-12-08 DIAGNOSIS — Z125 Encounter for screening for malignant neoplasm of prostate: Secondary | ICD-10-CM

## 2022-12-08 DIAGNOSIS — E785 Hyperlipidemia, unspecified: Secondary | ICD-10-CM | POA: Diagnosis not present

## 2022-12-08 DIAGNOSIS — Z79899 Other long term (current) drug therapy: Secondary | ICD-10-CM

## 2022-12-08 DIAGNOSIS — Z0000 Encounter for general adult medical examination without abnormal findings: Secondary | ICD-10-CM

## 2022-12-08 DIAGNOSIS — I10 Essential (primary) hypertension: Secondary | ICD-10-CM

## 2022-12-08 MED ORDER — DULOXETINE HCL 30 MG PO CPEP: 30.0000 mg | ORAL_CAPSULE | Freq: Every day | ORAL | 3 refills | Status: DC

## 2022-12-08 MED ORDER — SILDENAFIL CITRATE 100 MG PO TABS: 100.0000 mg | ORAL_TABLET | Freq: Every day | ORAL | 5 refills | Status: AC | PRN

## 2022-12-08 NOTE — Progress Notes (Signed)
   Subjective:    Patient ID: Phillip Mcguire, male    DOB: May 16, 1955, 67 y.o.   MRN: [id number]  HPI  The patient comes in today for a wellness visit.    A review of their health history was completed.  A review of medications was also completed.  Any needed refills; no  Eating habits: fair Patient trying to eat healthy Trying to stay physically active Trying to do a better job with portion control Energy level overall doing well Denies rectal bleeding  Falls/  MVA accidents in past few months: no  Regular exercise:   Specialist pt sees on regular basis: cardio, pulm, ent, opthalmalogy  Preventative health issues were discussed.   Additional concerns: neuropathy worse   Review of Systems     Objective:   Physical Exam General-in no acute distress Eyes-no discharge Lungs-respiratory rate normal, CTA CV-no murmurs,RRR Extremities skin warm dry no edema Neuro grossly normal Behavior normal, alert Prostate exam soft no nodules felt       Assessment & Plan:  1. Immunization due Today - Flu Vaccine Trivalent High Dose (Fluad)  2. Well adult exam Adult wellness-complete.wellness physical was conducted today. Importance of diet and exercise were discussed in detail.  Importance of stress reduction and healthy living were discussed.  In addition to this a discussion regarding safety was also covered.  We also reviewed over immunizations and gave recommendations regarding current immunization needed for age.   In addition to this additional areas were also touched on including: Preventative health exams needed:  Colonoscopy 2027  Patient was advised yearly wellness exam  - Lipid Panel - Hepatic Function Panel - Basic Metabolic Panel - PSA - CBC with Differential  3. Hyperlipidemia, unspecified hyperlipidemia type Healthy diet continue statin - Lipid Panel  4. High risk medication use Check labs - Hepatic Function Panel - CBC with  Differential  5. Hypertension, unspecified type Blood pressure good control check labs - Basic Metabolic Panel  6. Screening PSA (prostate specific antigen) Screening recommended - PSA  Follow up 6 months

## 2023-01-05 ENCOUNTER — Other Ambulatory Visit: Payer: Self-pay | Admitting: Family Medicine

## 2023-02-09 ENCOUNTER — Telehealth: Payer: Self-pay | Admitting: *Deleted

## 2023-02-09 NOTE — Telephone Encounter (Signed)
   Pre-operative Risk Assessment    Patient Name: Phillip Mcguire  DOB: [birth year] MRN: [id number]   Date of last office visit: 11/23/22 Robin Searing, NP  Date of next office visit: NONE   Request for Surgical Clearance    Procedure:  GONIOTOMY-LEFT EYE 03/08/23 FOLLOWED BY THE RIGHT EYE TO BE DONE ON 03/22/23  Date of Surgery:  Clearance 03/08/23                                Surgeon:  DR. Nada Libman Surgeon's Group or Practice Name:  [facility] Phone number:  [phone] EXT [phone] Fax number:  [phone]   Type of Clearance Requested:   - Medical  - Pharmacy:  Hold Apixaban (Eliquis) x 2-3 DAYS PRIOR BEFORE EACH SURGERY   Type of Anesthesia:   IV SEDATION   Additional requests/questions:    Elpidio Anis   02/09/2023, 6:09 PM

## 2023-02-10 ENCOUNTER — Encounter: Payer: Self-pay | Admitting: Family Medicine

## 2023-02-10 ENCOUNTER — Telehealth: Payer: Self-pay | Admitting: *Deleted

## 2023-02-10 LAB — PSA: Prostate Specific Ag, Serum: 1.8 ng/mL (ref 0.0–4.0)

## 2023-02-10 LAB — CBC WITH DIFFERENTIAL/PLATELET
Basophils Absolute: 0 10*3/uL (ref 0.0–0.2)
Basos: 1 %
EOS (ABSOLUTE): 0.1 10*3/uL (ref 0.0–0.4)
Eos: 2 %
Hematocrit: 49.2 % (ref 37.5–51.0)
Hemoglobin: 16.9 g/dL (ref 13.0–17.7)
Immature Grans (Abs): 0 10*3/uL (ref 0.0–0.1)
Immature Granulocytes: 0 %
Lymphocytes Absolute: 2.4 10*3/uL (ref 0.7–3.1)
Lymphs: 37 %
MCH: 29.9 pg (ref 26.6–33.0)
MCHC: 34.3 g/dL (ref 31.5–35.7)
MCV: 87 fL (ref 79–97)
Monocytes Absolute: 0.5 10*3/uL (ref 0.1–0.9)
Monocytes: 7 %
Neutrophils Absolute: 3.4 10*3/uL (ref 1.4–7.0)
Neutrophils: 53 %
Platelets: 278 10*3/uL (ref 150–450)
RBC: 5.66 x10E6/uL (ref 4.14–5.80)
RDW: 12.2 % (ref 11.6–15.4)
WBC: 6.4 10*3/uL (ref 3.4–10.8)

## 2023-02-10 LAB — LIPID PANEL
Chol/HDL Ratio: 3 {ratio} (ref 0.0–5.0)
Cholesterol, Total: 156 mg/dL (ref 100–199)
HDL: 52 mg/dL (ref >39)
LDL Chol Calc (NIH): 87 mg/dL (ref 0–99)
Triglycerides: 93 mg/dL (ref 0–149)
VLDL Cholesterol Cal: 17 mg/dL (ref 5–40)

## 2023-02-10 LAB — HEPATIC FUNCTION PANEL
ALT: 19 [IU]/L (ref 0–44)
AST: 23 [IU]/L (ref 0–40)
Albumin: 4.4 g/dL (ref 3.9–4.9)
Alkaline Phosphatase: 94 [IU]/L (ref 44–121)
Bilirubin Total: 1.6 mg/dL — ABNORMAL HIGH (ref 0.0–1.2)
Bilirubin, Direct: 0.43 mg/dL — ABNORMAL HIGH (ref 0.00–0.40)
Total Protein: 6.6 g/dL (ref 6.0–8.5)

## 2023-02-10 LAB — BASIC METABOLIC PANEL
BUN/Creatinine Ratio: 17 (ref 10–24)
BUN: 17 mg/dL (ref 8–27)
CO2: 25 mmol/L (ref 20–29)
Calcium: 9.7 mg/dL (ref 8.6–10.2)
Chloride: 101 mmol/L (ref 96–106)
Creatinine, Ser: 0.98 mg/dL (ref 0.76–1.27)
Glucose: 92 mg/dL (ref 70–99)
Potassium: 4.4 mmol/L (ref 3.5–5.2)
Sodium: 140 mmol/L (ref 134–144)
eGFR: 85 mL/min/{1.73_m2} (ref >59)

## 2023-02-10 NOTE — Telephone Encounter (Signed)
Pt has been scheduled tele preop appt 02/24/23. Med rec and consent are done.

## 2023-02-10 NOTE — Telephone Encounter (Signed)
   Name: Phillip Mcguire  DOB: Mar 22, 1955  MRN: [id number]  Primary Cardiologist: Christell Constant, MD   Preoperative team, please contact this patient and set up a phone call appointment for further preoperative risk assessment. Please obtain consent and complete medication review. Thank you for your help.  I will send to PharmD for recommendations regarding anticoagulation management perioperatively.  I also confirmed the patient resides in the state of [place]. As per [facility] Medical Board telemedicine laws, the patient must reside in the state in which the provider is licensed. [address], [place], [place] 02/10/2023, 7:20 AM [facility]

## 2023-02-10 NOTE — Telephone Encounter (Signed)
Pt has been scheduled tele preop appt 02/24/23. Med rec and consent are done.      Patient Consent for Virtual Visit        Phillip Mcguire has provided verbal consent on 02/10/2023 for a virtual visit (video or telephone).   CONSENT FOR VIRTUAL VISIT FOR:  Phillip Mcguire  By participating in this virtual visit I agree to the following:  I hereby voluntarily request, consent and authorize [facility] and its employed or contracted physicians, physician assistants, nurse practitioners or other licensed health care professionals (the Practitioner), to provide me with telemedicine health care services (the "Services") as deemed necessary by the treating Practitioner. I acknowledge and consent to receive the Services by the Practitioner via telemedicine. I understand that the telemedicine visit will involve communicating with the Practitioner through live audiovisual communication technology and the disclosure of certain medical information by electronic transmission. I acknowledge that I have been given the opportunity to request an in-person assessment or other available alternative prior to the telemedicine visit and am voluntarily participating in the telemedicine visit.  I understand that I have the right to withhold or withdraw my consent to the use of telemedicine in the course of my care at any time, without affecting my right to future care or treatment, and that the Practitioner or I may terminate the telemedicine visit at any time. I understand that I have the right to inspect all information obtained and/or recorded in the course of the telemedicine visit and may receive copies of available information for a reasonable fee.  I understand that some of the potential risks of receiving the Services via telemedicine include:  Delay or interruption in medical evaluation due to technological equipment failure or disruption; Information transmitted may not be sufficient (e.g. poor  resolution of images) to allow for appropriate medical decision making by the Practitioner; and/or  In rare instances, security protocols could fail, causing a breach of personal health information.  Furthermore, I acknowledge that it is my responsibility to provide information about my medical history, conditions and care that is complete and accurate to the best of my ability. I acknowledge that Practitioner's advice, recommendations, and/or decision may be based on factors not within their control, such as incomplete or inaccurate data provided by me or distortions of diagnostic images or specimens that may result from electronic transmissions. I understand that the practice of medicine is not an exact science and that Practitioner makes no warranties or guarantees regarding treatment outcomes. I acknowledge that a copy of this consent can be made available to me via my patient portal [facility] MyChart), or I can request a printed copy by calling the office of [facility].    I understand that my insurance will be billed for this visit.   I have read or had this consent read to me. I understand the contents of this consent, which adequately explains the benefits and risks of the Services being provided via telemedicine.  I have been provided ample opportunity to ask questions regarding this consent and the Services and have had my questions answered to my satisfaction. I give my informed consent for the services to be provided through the use of telemedicine in my medical care

## 2023-02-10 NOTE — Progress Notes (Signed)
Please mail to patient

## 2023-02-11 NOTE — Telephone Encounter (Signed)
Patient with diagnosis of atrial fibrillation on Eliquis for anticoagulation.    Procedure:  GONIOTOMY-LEFT EYE 03/08/23 FOLLOWED BY THE RIGHT EYE TO BE DONE ON 03/22/23   Date of Surgery:  Clearance 03/08/23    CHA2DS2-VASc Score = 3   This indicates a 3.2% annual risk of stroke. 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   Patient will not need bridging with Lovenox (enoxaparin) around procedure.  **This guidance is not considered finalized until pre-operative APP has relayed final recommendations.**

## 2023-02-24 ENCOUNTER — Ambulatory Visit: Payer: BC Managed Care – PPO | Attending: Physician Assistant

## 2023-02-24 DIAGNOSIS — Z01810 Encounter for preprocedural cardiovascular examination: Secondary | ICD-10-CM | POA: Diagnosis not present

## 2023-02-24 NOTE — Progress Notes (Signed)
Virtual Visit via Telephone Note   Because of ATHONY COPPA co-morbid illnesses, he is at least at moderate risk for complications without adequate follow up.  This format is felt to be most appropriate for this patient at this time.  The patient did not have access to video technology/had technical difficulties with video requiring transitioning to audio format only (telephone).  All issues noted in this document were discussed and addressed.  No physical exam could be performed with this format.  Please refer to the patient's chart for his consent to telehealth for [facility].  Evaluation Performed:  Preoperative cardiovascular risk assessment _____________   Date:  02/24/2023   Patient ID:  Phillip Mcguire, DOB [birth year], MRN [id number] Patient Location:  Home Provider location:   Office  Primary Care Provider:  Babs Sciara, MD Primary Cardiologist:  Christell Constant, MD  Chief Complaint / Patient Profile   68 y.o. y/o male with a h/o HLD, GERD, atrial fibrillation, thoracic aortic aneurysm, and sleep apnea who is pending Goniotomy of the left eye 03/08/2023 followed by the right eye to be done on 03/22/2023 and presents today for telephonic preoperative cardiovascular risk assessment.  History of Present Illness    Phillip Mcguire is a 68 y.o. male who presents via audio/video conferencing for a telehealth visit today.  Pt was last seen in cardiology clinic on 11/23/2022 by Robin Searing, NP.  At that time Phillip Mcguire was doing well other than some ongoing lightheadedness particularly in the morning..  No further workup was ordered at that time.  The patient is now pending procedure as outlined above. Since his last visit, he says that his lightheadedness in the morning is gotten a little bit better.  He thinks is due to fluid in his ears.  No chest pain or shortness of breath.  He does meet minimal METS.  Enjoys fishing.  Per office protocol, patient can hold  Eliquis for 2 to 3 days prior to each procedure.  Patient will not need bridging with Lovenox around procedure.  Please resume Eliquis when medically safe to do so.  Past Medical History    Past Medical History:  Diagnosis Date   Abnormal LFTs    mild elevation of total, indirect and direct bilirubin   Allergy    Congenital absence of kidney    Right   GERD (gastroesophageal reflux disease)    Hearing loss    Sleep apnea    Past Surgical History:  Procedure Laterality Date   COLONOSCOPY  2007   Rourk: normal   COLONOSCOPY N/A 12/26/2015   Procedure: COLONOSCOPY;  Surgeon: Corbin Ade, MD;  [facility];  Service: Endoscopy;  Laterality: N/A;  2:00 PM   ESOPHAGOGASTRODUODENOSCOPY  01/2002   Dr. Arlyce Dice: Normal   NAILBED REPAIR Right 06/14/2018   Procedure: RECONSTRUCTION NAILBED WITH PALMARIS GRAFT RIGHT THUMB;  Surgeon: Cindee Salt, MD;  Location: [facility];  Service: Orthopedics;  Laterality: Right;  AXILLARY BLOCK   NASAL SEPTUM SURGERY  1980's   otosclerosis     bilateral ear surgery   SHOULDER ARTHROSCOPY W/ ROTATOR CUFF REPAIR Left 2003   titanium implant Left March 2010   left middle ear    Allergies  Allergies  Allergen Reactions   Gabapentin     drowsy    Latex Rash    Home Medications    Prior to Admission medications   Medication Sig Start Date End Date Taking?  Authorizing Provider  apixaban (ELIQUIS) 5 MG TABS tablet Take 1 tablet (5 mg total) by mouth 2 (two) times daily. 05/15/22   Chandrasekhar, Mahesh A, MD  atorvastatin (LIPITOR) 20 MG tablet TAKE 1 TABLET BY MOUTH EVERY DAY 09/16/22   Babs Sciara, MD  azelastine (ASTELIN) 0.1 % nasal spray Place 1 spray into both nostrils 2 (two) times daily. Use in each nostril as directed Patient taking differently: Place 1 spray into both nostrils as needed. Use in each nostril as directed 08/12/20   Coralyn Helling, MD  CALCIUM-VITAMIN D PO Take 1 tablet by mouth daily. 200 units calcium,  1100units vit D    [provider]  Cholecalciferol (VITAMIN D) 2000 units CAPS Take 2,000 Units by mouth daily.    [provider]  DULoxetine (CYMBALTA) 30 MG capsule TAKE 1 CAPSULE BY MOUTH EVERY DAY 01/05/23   Babs Sciara, MD  Fluocinolone Acetonide Scalp (DERMA-SMOOTHE/FS SCALP) 0.01 % OIL leave on overnight or >4h before washing it off. Only use as needed. 05/12/22   Ellamae Sia, DO  fluticasone (FLONASE) 50 MCG/ACT nasal spray Place 1 spray into both nostrils as needed. 07/18/21   [provider]  ketoconazole (NIZORAL) 2 % shampoo Apply 1 Application topically as needed. 04/07/22   [provider]  latanoprost (XALATAN) 0.005 % ophthalmic solution as needed. 05/01/22   [provider]  loratadine (CLARITIN) 10 MG tablet Take 10 mg by mouth daily.    [provider]  Magnesium Oxide 200 MG TABS Take 200 mg by mouth daily.    [provider]  metroNIDAZOLE (METROCREAM) 0.75 % cream Apply topically 2 (two) times daily. Patient taking differently: Apply topically as needed. 06/16/22   Babs Sciara, MD  Multiple Vitamin (MULTIVITAMIN) capsule Take 1 capsule by mouth daily.    [provider]  omeprazole (PRILOSEC) 20 MG capsule Take 20 mg by mouth daily.  11/27/19   [provider]  OVER THE COUNTER MEDICATION Take 1 tablet by mouth daily. Zinc    [provider]  Probiotic Product (PROBIOTIC PO) Take 1 Dose by mouth daily. 300mg     [provider]  sildenafil (VIAGRA) 100 MG tablet Take 1 tablet (100 mg total) by mouth daily as needed for erectile dysfunction. 12/08/22   Babs Sciara, MD  vitamin C (ASCORBIC ACID) 500 MG tablet Take 500 mg by mouth daily.    [provider]    Physical Exam    Vital Signs:  Phillip Mcguire does not have vital signs available for review today.  Given telephonic nature of communication, physical exam is limited. AAOx3. NAD. Normal affect.  Speech  and respirations are unlabored.  Accessory Clinical Findings    None  Assessment & Plan    1.  Preoperative Cardiovascular Risk Assessment:  } Phillip Mcguire perioperative risk of a major cardiac event is 0.4% according to the Revised Cardiac Risk Index (RCRI).  Therefore, he is at low risk for perioperative complications.   His functional capacity is good at 5.62 METs according to the Duke Activity Status Index (DASI). Recommendations: According to ACC/AHA guidelines, no further cardiovascular testing needed.  The patient may proceed to surgery at acceptable risk.   Antiplatelet and/or Anticoagulation Recommendations:  Eliquis (Apixaban) can be held for 2-3 days prior to surgery.  Please resume post op when felt to be safe.     A copy of this note will be routed to requesting surgeon.  Time:  Today, I have spent 5 minutes with the patient with telehealth technology discussing medical history, symptoms, and management plan.     Sharlene Dory, PA-C  02/24/2023, 12:42 PM

## 2023-03-20 ENCOUNTER — Other Ambulatory Visit: Payer: Self-pay | Admitting: Family Medicine

## 2023-03-29 ENCOUNTER — Telehealth (INDEPENDENT_AMBULATORY_CARE_PROVIDER_SITE_OTHER): Payer: Self-pay | Admitting: Family Medicine

## 2023-03-29 ENCOUNTER — Encounter: Payer: Self-pay | Admitting: Family Medicine

## 2023-03-29 ENCOUNTER — Ambulatory Visit: Payer: Self-pay | Admitting: Family Medicine

## 2023-03-29 VITALS — Ht 72.0 in | Wt 215.0 lb

## 2023-03-29 DIAGNOSIS — U071 COVID-19: Secondary | ICD-10-CM | POA: Diagnosis not present

## 2023-03-29 MED ORDER — NIRMATRELVIR/RITONAVIR (PAXLOVID)TABLET
3.0000 | ORAL_TABLET | Freq: Two times a day (BID) | ORAL | 0 refills | Status: AC
Start: 2023-03-29 — End: 2023-04-03

## 2023-03-29 MED ORDER — PROMETHAZINE-DM 6.25-15 MG/5ML PO SYRP
5.0000 mL | ORAL_SOLUTION | Freq: Four times a day (QID) | ORAL | 0 refills | Status: AC | PRN
Start: 2023-03-29 — End: ?

## 2023-03-29 NOTE — Telephone Encounter (Signed)
 Patient had visit and was treated

## 2023-03-29 NOTE — Telephone Encounter (Signed)
  Chief Complaint: COVID+ Symptoms: Cough, HA, Sinus drainage Frequency: yesterday Pertinent Negatives: Patient denies Fever, SOB Disposition: [] ED /[] Urgent Care (no appt availability in office) / [x] Appointment(In office/virtual)/ []  [facility]/ [] Home Care/ [] Refused Recommended Disposition /[] [facility]/ []  Follow-up with PCP Additional Notes: Pt started s/s yesterday. Pt tested + for COVID today. Pt has used Mucinex with good relief. Pt has A-Fib and is interested in anti-viral medications. MyChart VV scheduled.  Summary: Covid positive, seeking advice   Copied From CRM [phone]. Reason for Triage: Tested positive for covid with home test  Cough, drainage, "head cold symptoms"  Best contact: [phone]     Reason for Disposition  [1] HIGH RISK patient (e.g., weak immune system, age > 64 years, obesity with BMI 30 or higher, pregnant, chronic lung disease or other chronic medical condition) AND [2] COVID symptoms (e.g., cough, fever)  (Exceptions: Already seen by PCP and no new or worsening symptoms.)  Answer Assessment - Initial Assessment Questions 1. COVID-19 DIAGNOSIS: "How do you know that you have COVID?" (e.g., positive lab test or self-test, diagnosed by doctor or NP/PA, symptoms after exposure).     Home test today. 2. COVID-19 EXPOSURE: "Was there any known exposure to COVID before the symptoms began?" CDC Definition of close contact: within 6 feet (2 meters) for a total of 15 minutes or more over a 24-hour period.      nope 3. ONSET: "When did the COVID-19 symptoms start?"      Yesterday 4. WORST SYMPTOM: "What is your worst symptom?" (e.g., cough, fever, shortness of breath, muscle aches)     Cough - sinus drainage - HA 5. COUGH: "Do you have a cough?" If Yes, ask: "How bad is the cough?"       Yes - not bad 6. FEVER: "Do you have a fever?" If Yes, ask: "What is your temperature, how was it measured, and when did it start?"     no 7.  RESPIRATORY STATUS: "Describe your breathing?" (e.g., normal; shortness of breath, wheezing, unable to speak)      no 8. BETTER-SAME-WORSE: "Are you getting better, staying the same or getting worse compared to yesterday?"  If getting worse, ask, "In what way?"     better 9. OTHER SYMPTOMS: "Do you have any other symptoms?"  (e.g., chills, fatigue, headache, loss of smell or taste, muscle pain, sore throat)     Cough, sinus congestion 10. HIGH RISK DISEASE: "Do you have any chronic medical problems?" (e.g., asthma, heart or lung disease, weak immune system, obesity, etc.)       A-FIB  Protocols used: Coronavirus (COVID-19) Diagnosed or Suspected-A-AH

## 2023-03-29 NOTE — Progress Notes (Signed)
 Virtual Visit via Video Note  I connected with Phillip Mcguire on 03/29/23 at  1:00 PM EDT by a video enabled telemedicine application and verified that I am speaking with the correct person using two identifiers.  Patient Location: Home Provider Location: Office/Clinic  I discussed the limitations, risks, security, and privacy concerns of performing an evaluation and management service by video and the availability of in person appointments. I also discussed with the patient that there may be a patient responsible charge related to this service. The patient expressed understanding and agreed to proceed.  Subjective: PCP: Babs Sciara, MD  Chief Complaint  Patient presents with   Acute Visit    Symptoms, sinus pressure, cough, nasal drainage, chest congestion since yesterday.    HPI The patient is in today, reporting that he tested positive for COVID this morning and complains of symptoms including sinus pressure, cough, nasal drainage, and chest congestion. He has been taking over-the-counter Mucinex with minimal relief. No fever or chills, but he does report a mild sore throat.   ROS: Per HPI  Current Outpatient Medications:    apixaban (ELIQUIS) 5 MG TABS tablet, Take 1 tablet (5 mg total) by mouth 2 (two) times daily., Disp: 56 tablet, Rfl: 0   atorvastatin (LIPITOR) 20 MG tablet, TAKE 1 TABLET BY MOUTH EVERY DAY, Disp: 90 tablet, Rfl: 1   azelastine (ASTELIN) 0.1 % nasal spray, Place 1 spray into both nostrils 2 (two) times daily. Use in each nostril as directed (Patient taking differently: Place 1 spray into both nostrils as needed. Use in each nostril as directed), Disp: 30 mL, Rfl: 12   CALCIUM-VITAMIN D PO, Take 1 tablet by mouth daily. 200 units calcium, 1100units vit D, Disp: , Rfl:    Cholecalciferol (VITAMIN D) 2000 units CAPS, Take 2,000 Units by mouth daily., Disp: , Rfl:    DULoxetine (CYMBALTA) 30 MG capsule, TAKE 1 CAPSULE BY MOUTH EVERY DAY, Disp: 90 capsule,  Rfl: 2   Fluocinolone Acetonide Scalp (DERMA-SMOOTHE/FS SCALP) 0.01 % OIL, leave on overnight or >4h before washing it off. Only use as needed., Disp: 118 mL, Rfl: 1   fluticasone (FLONASE) 50 MCG/ACT nasal spray, Place 1 spray into both nostrils as needed., Disp: , Rfl:    ketoconazole (NIZORAL) 2 % shampoo, Apply 1 Application topically as needed., Disp: , Rfl:    latanoprost (XALATAN) 0.005 % ophthalmic solution, as needed., Disp: , Rfl:    loratadine (CLARITIN) 10 MG tablet, Take 10 mg by mouth daily., Disp: , Rfl:    Magnesium Oxide 200 MG TABS, Take 200 mg by mouth daily., Disp: , Rfl:    metroNIDAZOLE (METROCREAM) 0.75 % cream, Apply topically 2 (two) times daily. (Patient taking differently: Apply topically as needed.), Disp: 45 g, Rfl: 1   Multiple Vitamin (MULTIVITAMIN) capsule, Take 1 capsule by mouth daily., Disp: , Rfl:    nirmatrelvir/ritonavir (PAXLOVID) 20 x 150 MG & 10 x 100MG  TABS, Take 3 tablets by mouth 2 (two) times daily for 5 days. (Take nirmatrelvir 150 mg two tablets twice daily for 5 days and ritonavir 100 mg one tablet twice daily for 5 days) Patient GFR is 85, Disp: 30 tablet, Rfl: 0   omeprazole (PRILOSEC) 20 MG capsule, Take 20 mg by mouth daily. , Disp: , Rfl:    OVER THE COUNTER MEDICATION, Take 1 tablet by mouth daily. Zinc, Disp: , Rfl:    Probiotic Product (PROBIOTIC PO), Take 1 Dose by mouth daily. 300mg , Disp: ,  Rfl:    promethazine-dextromethorphan (PROMETHAZINE-DM) 6.25-15 MG/5ML syrup, Take 5 mLs by mouth 4 (four) times daily as needed., Disp: 118 mL, Rfl: 0   sildenafil (VIAGRA) 100 MG tablet, Take 1 tablet (100 mg total) by mouth daily as needed for erectile dysfunction., Disp: 5 tablet, Rfl: 5   vitamin C (ASCORBIC ACID) 500 MG tablet, Take 500 mg by mouth daily., Disp: , Rfl:   Current Facility-Administered Medications:    triamcinolone acetonide (KENALOG-40) injection 20 mg, 20 mg, Other, Once, Hyatt, Max T, DPM  Observations/Objective: Today's Vitals    03/29/23 1304  Weight: 215 lb (97.5 kg)  Height: 6' (1.829 m)  PainSc: 0-No pain   Physical Exam Patient is well-developed, well-nourished in no acute distress.  Head is normocephalic, atraumatic.  No labored breathing.  Speech is clear and coherent with logical content.  Patient is alert and oriented at baseline.       Assessment and Plan: Positive self-administered antigen test for COVID-19 -     nirmatrelvir/ritonavir; Take 3 tablets by mouth 2 (two) times daily for 5 days. (Take nirmatrelvir 150 mg two tablets twice daily for 5 days and ritonavir 100 mg one tablet twice daily for 5 days) Patient GFR is 85  Dispense: 30 tablet; Refill: 0 -     Promethazine-DM; Take 5 mLs by mouth 4 (four) times daily as needed.  Dispense: 118 mL; Refill: 0  The patient was informed to decrease the dose of Eliquis to 2.5 mg daily while taking Paxlovid and to monitor for bleeding. The patient was also encouraged not to take Viagra 100 mg daily while on the treatment regimen, and the patient verbalized understanding.  Nonpharmacological interventions were discussed, and the patient verbalized understanding  Follow Up Instructions: No follow-ups on file.   I discussed the assessment and treatment plan with the patient. The patient was provided an opportunity to ask questions, and all were answered. The patient agreed with the plan and demonstrated an understanding of the instructions.   The patient was advised to call back or seek an in-person evaluation if the symptoms worsen or if the condition fails to improve as anticipated.  The above assessment and management plan was discussed with the patient. The patient verbalized understanding of and has agreed to the management plan.   Gilmore Laroche, FNP

## 2023-03-29 NOTE — Progress Notes (Deleted)
 Acute Office Visit  Subjective:    Patient ID: Phillip Mcguire, male    DOB: [birth year], 68 y.o.   MRN: [id number]  Chief Complaint  Patient presents with   Acute Visit    Symptoms, sinus pressure, cough, nasal drainage, chest congestion since yesterday.     HPI    Past Medical History:  Diagnosis Date   Abnormal LFTs    mild elevation of total, indirect and direct bilirubin   Allergy    Congenital absence of kidney    Right   GERD (gastroesophageal reflux disease)    Hearing loss    Sleep apnea     Past Surgical History:  Procedure Laterality Date   COLONOSCOPY  2007   Rourk: normal   COLONOSCOPY N/A 12/26/2015   Procedure: COLONOSCOPY;  Surgeon: Corbin Ade, MD;  [facility];  Service: Endoscopy;  Laterality: N/A;  2:00 PM   ESOPHAGOGASTRODUODENOSCOPY  01/2002   Dr. Arlyce Dice: Normal   NAILBED REPAIR Right 06/14/2018   Procedure: RECONSTRUCTION NAILBED WITH PALMARIS GRAFT RIGHT THUMB;  Surgeon: Cindee Salt, MD;  Location: [facility];  Service: Orthopedics;  Laterality: Right;  AXILLARY BLOCK   NASAL SEPTUM SURGERY  1980's   otosclerosis     bilateral ear surgery   SHOULDER ARTHROSCOPY W/ ROTATOR CUFF REPAIR Left 2003   titanium implant Left March 2010   left middle ear    Family History  Problem Relation Age of Onset   Heart disease Mother    Hypertension Mother    Diabetes Mother    Hyperlipidemia Mother    Heart disease Father    Lung cancer Father    Hyperlipidemia Father    Diabetes Father    Allergies Son    Cancer Maternal Aunt        breast   Cancer Maternal Uncle        colon   Cancer Paternal Aunt        breast   Cancer Paternal Uncle        brain tumor   Diabetes Sister    Neuropathy Neg Hx     Social History   Socioeconomic History   Marital status: Widowed    Spouse name: Jerrye Beavers   Number of children: 1   Years of education: 12   Highest education level: Some college, no degree  Occupational History    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    Comment: [employer]  Tobacco Use   Smoking status: Former    Current packs/day: 0.00    Average packs/day: 1.5 packs/day for 15.0 years (22.5 ttl pk-yrs)    Types: Cigarettes    Start date: 01/12/1973    Quit date: 01/13/1988    Years since quitting: 35.2   Smokeless tobacco: Former  Building services engineer status: Never Used  Substance and Sexual Activity   Alcohol use: Yes    Alcohol/week: 0.0 standard drinks of alcohol    Comment: rarely drink beer   Drug use: No   Sexual activity: Yes    Partners: Female  Other Topics Concern   Not on file  Social History Narrative   He works in Production designer, theatre/television/film, Engineer, building services   Lives with wife.     Caffeine use: 2 cups coffee per day   Social Drivers of Health   Financial Resource Strain: Low Risk  (06/14/2022)   Overall Financial Resource Strain (CARDIA)    Difficulty of Paying Living Expenses: Not hard at  all  Food Insecurity: Low Risk  (03/05/2023)   Received from [facility]   Hunger Vital Sign    Worried About Running Out of Food in the Last Year: Never true    Ran Out of Food in the Last Year: Never true  Transportation Needs: No Transportation Needs (03/05/2023)   [facility]   Transportation    In the past 12 months, has lack of reliable transportation kept you from medical appointments, meetings, work or from getting things needed for daily living? : No  Physical Activity: Insufficiently Active (06/14/2022)   Exercise Vital Sign    Days of Exercise per Week: 3 days    Minutes of Exercise per Session: 30 min  Stress: No Stress Concern Present (06/14/2022)   [employer] of Occupational Health - Occupational Stress Questionnaire    Feeling of Stress : Only a little  Social Connections: Unknown (09/03/2021)   [facility]   Social Network    Social Network: Not on file  Intimate Partner Violence: Unknown (09/03/2021)   Received from [facility]   HITS    Physically Hurt: Not on file    Insult or Talk Down To: Not on file    Threaten Physical Harm: Not on file    Scream or Curse: Not on file    Outpatient Medications Prior to Visit  Medication Sig Dispense Refill   apixaban (ELIQUIS) 5 MG TABS tablet Take 1 tablet (5 mg total) by mouth 2 (two) times daily. 56 tablet 0   atorvastatin (LIPITOR) 20 MG tablet TAKE 1 TABLET BY MOUTH EVERY DAY 90 tablet 1   azelastine (ASTELIN) 0.1 % nasal spray Place 1 spray into both nostrils 2 (two) times daily. Use in each nostril as directed (Patient taking differently: Place 1 spray into both nostrils as needed. Use in each nostril as directed) 30 mL 12   CALCIUM-VITAMIN D PO Take 1 tablet by mouth daily. 200 units calcium, 1100units vit D     Cholecalciferol (VITAMIN D) 2000 units CAPS Take 2,000 Units by mouth daily.     DULoxetine (CYMBALTA) 30 MG capsule TAKE 1 CAPSULE BY MOUTH EVERY DAY 90 capsule 2   Fluocinolone Acetonide Scalp (DERMA-SMOOTHE/FS SCALP) 0.01 % OIL leave on overnight or >4h before washing it off. Only use as needed. 118 mL 1   fluticasone (FLONASE) 50 MCG/ACT nasal spray Place 1 spray into both nostrils as needed.     ketoconazole (NIZORAL) 2 % shampoo Apply 1 Application topically as needed.     latanoprost (XALATAN) 0.005 % ophthalmic solution as needed.     loratadine (CLARITIN) 10 MG tablet Take 10 mg by mouth daily.     Magnesium Oxide 200 MG TABS Take 200 mg by mouth daily.     metroNIDAZOLE (METROCREAM) 0.75 % cream Apply topically 2 (two) times daily. (Patient taking differently: Apply topically as needed.) 45 g 1   Multiple Vitamin (MULTIVITAMIN) capsule Take 1 capsule by mouth daily.     omeprazole (PRILOSEC) 20 MG capsule Take 20 mg by mouth daily.      OVER THE COUNTER MEDICATION Take 1 tablet by mouth daily. Zinc     Probiotic Product (PROBIOTIC PO) Take 1 Dose by mouth daily. 300mg      sildenafil (VIAGRA) 100 MG tablet Take 1 tablet (100 mg  total) by mouth daily as needed for erectile dysfunction. 5 tablet 5   vitamin C (ASCORBIC ACID) 500 MG tablet Take  500 mg by mouth daily.     Facility-Administered Medications Prior to Visit  Medication Dose Route Frequency Provider Last Rate Last Admin   triamcinolone acetonide (KENALOG-40) injection 20 mg  20 mg Other Once Hyatt, Max T, [place]        Allergies  Allergen Reactions   Gabapentin     drowsy    Latex Rash    Review of Systems     Objective:    Physical Exam  Ht 6' (1.829 m)   Wt 215 lb (97.5 kg)   BMI 29.16 kg/m  Wt Readings from Last 3 Encounters:  03/29/23 215 lb (97.5 kg)  12/08/22 219 lb (99.3 kg)  12/07/22 221 lb (100.2 kg)       Assessment & Plan:  There are no diagnoses linked to this encounter.  Gilmore Laroche, FNP

## 2023-04-23 ENCOUNTER — Telehealth: Payer: Self-pay | Admitting: Internal Medicine

## 2023-04-23 NOTE — Telephone Encounter (Signed)
 Patient c/o Palpitations:  STAT if patient reporting lightheadedness, shortness of breath, or chest pain  How long have you had palpitations/irregular HR/ Afib? Are you having the symptoms now? Afib, not currently  Are you currently experiencing lightheadedness, SOB or CP? lightheaded  Do you have a history of afib (atrial fibrillation) or irregular heart rhythm? Yes afib  Have you checked your BP or HR? (document readings if available): HR 75  Are you experiencing any other symptoms? Fatigue, pt mainly wants to know when to take med for afib (did not know name of med) not sure at what point he is to take

## 2023-04-23 NOTE — Telephone Encounter (Signed)
 Spoke with patient and he states he wakes up with  palpitations every morning around 3 am. Palpitations last for a couple of hours. He also states he have chest tightness/soreness. No swelling. Can not tell me what his BP is HR 75.  Advised if he is having palpitations and chest pain to call 911 and go to the ED.  He states he is asymptomatic and is currently at work. He is concerned because the palpitations are more frequent. ED precautions discussed. Appointment scheduled.

## 2023-04-29 ENCOUNTER — Ambulatory Visit: Admitting: Family Medicine

## 2023-04-29 VITALS — BP 118/76

## 2023-04-29 DIAGNOSIS — L723 Sebaceous cyst: Secondary | ICD-10-CM | POA: Diagnosis not present

## 2023-04-29 DIAGNOSIS — R5383 Other fatigue: Secondary | ICD-10-CM

## 2023-04-29 DIAGNOSIS — R42 Dizziness and giddiness: Secondary | ICD-10-CM | POA: Diagnosis not present

## 2023-04-29 DIAGNOSIS — R55 Syncope and collapse: Secondary | ICD-10-CM

## 2023-04-29 NOTE — Progress Notes (Signed)
   Subjective:    Patient ID: Phillip Mcguire, male    DOB: May 04, 1955, 69 y.o.   MRN: [id number]  HPI Extremely nice patient Relates he found a nodule underneath his right chin This caused him concern He was interested in getting our opinion He states it did seem bigger but now is slightly smaller He denies any pain or discomfort with touching it or squeezing it Denies any mouth pain Is under the care of ear nose throat for a left otitis externa  He also relates a lot of fatigue feeling woozy and dizzy in the morning this had some moments every single day when he goes to work he gets up around 3:30 in the morning goes to work around 4 in the morning and eats around 8 in the morning and then eats lunch and at dinner he states he tries to eat healthy and eats a fair amount tries to drink his liquids denies any passing out spells just feels weak this been going on for the past 2 to 3 weeks he has been working a fair amount of hours but he does not feel he is working excessive hours denies any rectal bleeding no wheezing or chest pressure tightness  Review of Systems     Objective:   Physical Exam Eardrums right side normal left side has treatment from the ENT Throat there is no masses seen Internal exam with a gloved hand did not reveal any masses or tumors no lymphadenopathy He does have what appears to be a small sebaceous cyst within the subcutaneous tissue on the right side of the midline of the neck It is possible this came from shaving creating a small cyst        Assessment & Plan:  Sebaceous cyst it is reasonable to get the input of the ENT he does have a follow-up with them coming up in several days we will connect with them to asked them to recheck this issue  Significant fatigue tiredness we will do lab work to look for underlying causes it is quite possible this could be just body fatigue from working so many hours certainly if worsening symptoms will need further  workup  Patient with presyncope symptoms but no true syncope.

## 2023-04-30 ENCOUNTER — Other Ambulatory Visit: Payer: Self-pay | Admitting: Family Medicine

## 2023-04-30 ENCOUNTER — Encounter: Payer: Self-pay | Admitting: Family Medicine

## 2023-04-30 LAB — CBC WITH DIFFERENTIAL/PLATELET
Basophils Absolute: 0.1 10*3/uL (ref 0.0–0.2)
Basos: 1 %
EOS (ABSOLUTE): 0.2 10*3/uL (ref 0.0–0.4)
Eos: 3 %
Hematocrit: 46.8 % (ref 37.5–51.0)
Hemoglobin: 15.9 g/dL (ref 13.0–17.7)
Immature Grans (Abs): 0 10*3/uL (ref 0.0–0.1)
Immature Granulocytes: 0 %
Lymphocytes Absolute: 3.1 10*3/uL (ref 0.7–3.1)
Lymphs: 38 %
MCH: 30.1 pg (ref 26.6–33.0)
MCHC: 34 g/dL (ref 31.5–35.7)
MCV: 89 fL (ref 79–97)
Monocytes Absolute: 0.7 10*3/uL (ref 0.1–0.9)
Monocytes: 8 %
Neutrophils Absolute: 4.1 10*3/uL (ref 1.4–7.0)
Neutrophils: 50 %
Platelets: 254 10*3/uL (ref 150–450)
RBC: 5.28 x10E6/uL (ref 4.14–5.80)
RDW: 13 % (ref 11.6–15.4)
WBC: 8.1 10*3/uL (ref 3.4–10.8)

## 2023-04-30 LAB — CMP14+EGFR
ALT: 20 IU/L (ref 0–44)
AST: 20 IU/L (ref 0–40)
Albumin: 4.4 g/dL (ref 3.9–4.9)
Alkaline Phosphatase: 115 IU/L (ref 44–121)
BUN/Creatinine Ratio: 17 (ref 10–24)
BUN: 18 mg/dL (ref 8–27)
Bilirubin Total: 1 mg/dL (ref 0.0–1.2)
CO2: 28 mmol/L (ref 20–29)
Calcium: 9.7 mg/dL (ref 8.6–10.2)
Chloride: 100 mmol/L (ref 96–106)
Creatinine, Ser: 1.05 mg/dL (ref 0.76–1.27)
Globulin, Total: 2.2 g/dL (ref 1.5–4.5)
Glucose: 95 mg/dL (ref 70–99)
Potassium: 4.6 mmol/L (ref 3.5–5.2)
Sodium: 142 mmol/L (ref 134–144)
Total Protein: 6.6 g/dL (ref 6.0–8.5)
eGFR: 78 mL/min/{1.73_m2} (ref >59)

## 2023-05-03 ENCOUNTER — Ambulatory Visit

## 2023-05-03 ENCOUNTER — Ambulatory Visit: Attending: Internal Medicine | Admitting: Internal Medicine

## 2023-05-03 VITALS — BP 122/78 | HR 58 | Ht 71.0 in | Wt 213.0 lb

## 2023-05-03 DIAGNOSIS — I2510 Atherosclerotic heart disease of native coronary artery without angina pectoris: Secondary | ICD-10-CM | POA: Diagnosis not present

## 2023-05-03 DIAGNOSIS — Z01810 Encounter for preprocedural cardiovascular examination: Secondary | ICD-10-CM | POA: Diagnosis not present

## 2023-05-03 DIAGNOSIS — I480 Paroxysmal atrial fibrillation: Secondary | ICD-10-CM

## 2023-05-03 DIAGNOSIS — R42 Dizziness and giddiness: Secondary | ICD-10-CM

## 2023-05-03 DIAGNOSIS — I700 Atherosclerosis of aorta: Secondary | ICD-10-CM

## 2023-05-03 DIAGNOSIS — E785 Hyperlipidemia, unspecified: Secondary | ICD-10-CM

## 2023-05-03 NOTE — Progress Notes (Unsigned)
 Enrolled for [employer] to mail a ZIO XT long term holter monitor to the patients address on file.

## 2023-05-03 NOTE — Progress Notes (Signed)
 Cardiology Office Note:    Date:  05/03/2023   ID:  EDU ON, DOB 1955/12/29, MRN [id number]  PCP:  Bennet Brasil, MD   [facility]  Cardiologist:  Jann Melody, MD  Advanced Practice Provider:  No care team member to display Electrophysiologist:  None      CC: AF follow up  History of Present Illness:    Phillip Mcguire is a 68 y.o. male with a hx of OSA, Atrial Fibrillation, Borderline aortic dilation who presents for evaluation 03/21/20. Started on ASA since last visit.  Had normal CCTA.  Seen in 2023. 2023: Had normal stress test, and LVEF has improved. 2024: Rare chest pain  Phillip Mcguire is a 68 year old male with paroxysmal atrial fibrillation who presents with dizziness.  He has been experiencing persistent lightheadedness for the past one to one and a half months, primarily occurring in the morning and around lunchtime. The sensation is similar to previous episodes of atrial fibrillation, including feelings of blacking out, and occurs daily.  No chest pain, chest pressure, or tightness. Breathing is described as decent, with no difficulty breathing when lying flat, and no swelling in the legs or abdomen. No unusual coughs are reported. He denies vertigo when lightheaded.  He recently underwent cataract surgery and had an ear infection. There are no new job stressors or dietary changes that could account for his symptoms.  His past medical history includes obstructive sleep apnea managed without medication, hyperlipidemia with an LDL slightly above the goal on atorvastatin , and aortic atherosclerosis with a calcium  score of zero. Blood pressure is well controlled without medication, and he is on Eliquis  5 mg twice daily for atrial fibrillation.  Discussed the use of AI scribe software for clinical note transcription with the patient, who gave verbal consent to proceed.    Past Medical History:  Diagnosis Date   Abnormal LFTs     mild elevation of total, indirect and direct bilirubin   Allergy     Congenital absence of kidney    Right   GERD (gastroesophageal reflux disease)    Hearing loss    Sleep apnea     Past Surgical History:  Procedure Laterality Date   COLONOSCOPY  2007   Rourk: normal   COLONOSCOPY N/A 12/26/2015   Procedure: COLONOSCOPY;  Surgeon: Suzette Espy, MD;  [facility];  Service: Endoscopy;  Laterality: N/A;  2:00 PM   ESOPHAGOGASTRODUODENOSCOPY  01/2002   Dr. Arvie Latus: Normal   NAILBED REPAIR Right 06/14/2018   Procedure: RECONSTRUCTION NAILBED WITH PALMARIS GRAFT RIGHT THUMB;  Surgeon: Lyanne Sample, MD;  Location: [facility];  Service: Orthopedics;  Laterality: Right;  AXILLARY BLOCK   NASAL SEPTUM SURGERY  1980's   otosclerosis     bilateral ear surgery   SHOULDER ARTHROSCOPY W/ ROTATOR CUFF REPAIR Left 2003   titanium implant Left March 2010   left middle ear    Current Medications: Current Meds  Medication Sig   apixaban  (ELIQUIS ) 5 MG TABS tablet Take 1 tablet (5 mg total) by mouth 2 (two) times daily.   atorvastatin  (LIPITOR) 20 MG tablet TAKE 1 TABLET BY MOUTH EVERY DAY   azelastine  (ASTELIN ) 0.1 % nasal spray Place 1 spray into both nostrils 2 (two) times daily. Use in each nostril as directed (Patient taking differently: Place 1 spray into both nostrils as needed. Use in each nostril as directed)   CALCIUM -VITAMIN D PO Take 1 tablet  by mouth daily. 200 units calcium , 1100units vit D   Cholecalciferol (VITAMIN D) 2000 units CAPS Take 2,000 Units by mouth daily.   clotrimazole (LOTRIMIN) 1 % external solution as needed (irritation).   DULoxetine  (CYMBALTA ) 30 MG capsule TAKE 1 CAPSULE BY MOUTH EVERY DAY   Fluocinolone  Acetonide Scalp (DERMA-SMOOTHE /FS SCALP) 0.01 % OIL leave on overnight or >4h before washing it off. Only use as needed.   fluticasone  (FLONASE ) 50 MCG/ACT nasal spray Place 1 spray into both nostrils as needed.   ketoconazole  (NIZORAL ) 2 %  shampoo Apply 1 Application topically as needed.   latanoprost (XALATAN) 0.005 % ophthalmic solution as needed.   loratadine (CLARITIN) 10 MG tablet Take 10 mg by mouth daily.   Magnesium  Oxide 200 MG TABS Take 200 mg by mouth daily.   metroNIDAZOLE  (METROCREAM ) 0.75 % cream Apply topically 2 (two) times daily. (Patient taking differently: Apply topically as needed.)   Multiple Vitamin (MULTIVITAMIN) capsule Take 1 capsule by mouth daily.   mupirocin ointment (BACTROBAN) 2 % Apply topically 3 (three) times a week.   omeprazole (PRILOSEC) 20 MG capsule Take 20 mg by mouth daily.    OVER THE COUNTER MEDICATION Take 1 tablet by mouth daily. Zinc   prednisoLONE acetate (PRED FORTE) 1 % ophthalmic suspension Place 1 drop into both eyes daily at 6 (six) AM.   Probiotic Product (PROBIOTIC PO) Take 1 Dose by mouth daily. 300mg    promethazine -dextromethorphan (PROMETHAZINE -DM) 6.25-15 MG/5ML syrup Take 5 mLs by mouth 4 (four) times daily as needed.   sildenafil  (VIAGRA ) 100 MG tablet Take 1 tablet (100 mg total) by mouth daily as needed for erectile dysfunction.   vitamin C (ASCORBIC ACID) 500 MG tablet Take 500 mg by mouth daily.   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    Comment: [employer]  Tobacco Use   Smoking status: Former    Current packs/day: 0.00    Average packs/day: 1.5 packs/day for 15.0 years (22.5 ttl pk-yrs)    Types: Cigarettes    Start date: 01/12/1973    Quit date: 01/13/1988    Years since quitting: 35.3   Smokeless tobacco: Former   Building services engineer status: Never Used  Substance and Sexual Activity   Alcohol use: Yes    Alcohol/week: 0.0 standard drinks of alcohol    Comment: rarely drink beer   Drug use: No   Sexual activity: Yes    Partners: Female  Other Topics Concern   Not on file  Social History Narrative   He works in Production designer, theatre/television/film, Engineer, building services   Lives with wife.     Caffeine use: 2 cups coffee per day   Social Drivers of Health   Financial Resource Strain: Low Risk  (06/14/2022)   Overall Financial Resource Strain (CARDIA)    Difficulty of Paying Living Expenses: Not hard at all  Food Insecurity: Low Risk  (03/05/2023)   Received from [facility]   Hunger Vital Sign    Worried About Running Out of Food in the Last Year: Never  true    Ran Out of Food in the Last Year: Never true  Transportation Needs: No Transportation Needs (03/05/2023)   Received from [facility]    In the past 12 months, has lack of reliable transportation kept you from medical appointments, meetings, work or from getting things needed for daily living? : No  Physical Activity: Insufficiently Active (06/14/2022)   Exercise Vital Sign    Days of Exercise per Week: 3 days    Minutes of Exercise per Session: 30 min  Stress: No Stress Concern Present (06/14/2022)   [employer] of Occupational Health - Occupational Stress Questionnaire    Feeling of Stress : Only a little  Social Connections: Unknown (09/03/2021)   Received from [facility]   Social Network    Social Network: Not on file     Family History: The patient's family history includes Allergies in his son; Cancer in his maternal aunt, maternal uncle, paternal aunt, and paternal uncle; Diabetes in his father, mother, and sister; Heart disease in his father and mother; Hyperlipidemia in his father and mother; Hypertension in his mother; Lung cancer in his father. There is no history of Neuropathy. History of coronary artery  disease notable for father. History of heart failure notable for no members. History of arrhythmia notable for mother (needed defibrillator NOS). Denies family history of sudden cardiac death including drowning, car accidents, or unexplained deaths in the family. No history of bicuspid aortic valve or aortic aneurysm or dissection.   Grandmother had some type of aneurysm (may have been brain).  ROS:   Please see the history of present illness.     EKGs/Labs/Other Studies Reviewed:    The following studies were reviewed today:   Cardiac Studies & Procedures   ______________________________________________________________________________________________   STRESS TESTS  MYOCARDIAL PERFUSION IMAGING 03/23/2022  Narrative   The study is normal. The study is low risk.   No ST deviation was noted.   Nuclear stress EF: 54 %. The left ventricular ejection fraction is mildly decreased (45-54%). End diastolic cavity size is normal.   ECHOCARDIOGRAM  ECHOCARDIOGRAM COMPLETE 04/20/2022  Narrative ECHOCARDIOGRAM REPORT    Patient Name:   DICKSON KOSTELNIK Date of Exam: 04/20/2022 Medical Rec #:  [id number]      Height:       72.0 in Accession #:    [id number]     Weight:       219.0 lb Date of Birth:  [birth year]      BSA:          2.214 m Patient Age:    66 years       BP:           116/74 mmHg Patient Gender: M              HR:           64 bpm. Exam Location:  [address]  Procedure: 2D Echo, Cardiac Doppler and Color Doppler  Indications:    I48.0 Pqaroxysmal atrial fibrillation  History:        Patient has prior history of Echocardiogram examinations, most recent 03/24/2021. Arrythmias:Paroxysmal atrial fibrillation.  Sonographer:    Lula Sale RDCS Referring Phys: [phone] Cathaleen Clinton, JR DICK  IMPRESSIONS   1. Left ventricular ejection fraction, by estimation, is 60 to 65%. The left ventricle has normal function. The left ventricle has no regional wall motion abnormalities.  Left ventricular diastolic parameters were normal. 2. Right ventricular systolic  function is normal. The right ventricular size is normal. 3. The mitral valve is normal in structure. Trivial mitral valve regurgitation. No evidence of mitral stenosis. 4. The aortic valve is tricuspid. Aortic valve regurgitation is not visualized. No aortic stenosis is present. 5. The inferior vena cava is normal in size with greater than 50% respiratory variability, suggesting right atrial pressure of 3 mmHg.  Comparison(s): No significant change from prior study. Prior images reviewed side by side.  FINDINGS Left Ventricle: Left ventricular ejection fraction, by estimation, is 60 to 65%. The left ventricle has normal function. The left ventricle has no regional wall motion abnormalities. The left ventricular internal cavity size was normal in size. There is no left ventricular hypertrophy. Left ventricular diastolic parameters were normal.  Right Ventricle: The right ventricular size is normal. No increase in right ventricular wall thickness. Right ventricular systolic function is normal.  Left Atrium: Left atrial size was normal in size.  Right Atrium: Right atrial size was normal in size.  Pericardium: There is no evidence of pericardial effusion.  Mitral Valve: The mitral valve is normal in structure. Trivial mitral valve regurgitation. No evidence of mitral valve stenosis.  Tricuspid Valve: The tricuspid valve is normal in structure. Tricuspid valve regurgitation is trivial. No evidence of tricuspid stenosis.  Aortic Valve: The aortic valve is tricuspid. Aortic valve regurgitation is not visualized. No aortic stenosis is present.  Pulmonic Valve: The pulmonic valve was normal in structure. Pulmonic valve regurgitation is trivial. No evidence of pulmonic stenosis.  Aorta: The aortic root is normal in size and structure.  Venous: The inferior vena cava is normal in size with greater than 50%  respiratory variability, suggesting right atrial pressure of 3 mmHg.  IAS/Shunts: No atrial level shunt detected by color flow Doppler.   LEFT VENTRICLE PLAX 2D LVIDd:         6.10 cm   Diastology LVIDs:         4.20 cm   LV e' medial:    8.70 cm/s LV PW:         0.80 cm   LV E/e' medial:  8.3 LV IVS:        0.90 cm   LV e' lateral:   14.60 cm/s LVOT diam:     2.10 cm   LV E/e' lateral: 5.0 LV SV:         87 LV SV Index:   39 LVOT Area:     3.46 cm   RIGHT VENTRICLE             IVC RV S prime:     16.00 cm/s  IVC diam: 1.40 cm TAPSE (M-mode): 2.3 cm RVSP:           28.4 mmHg  LEFT ATRIUM             Index        RIGHT ATRIUM           Index LA diam:        4.40 cm 1.99 cm/m   RA Pressure: 3.00 mmHg LA Vol (A2C):   65.8 ml 29.72 ml/m  RA Area:     16.90 cm LA Vol (A4C):   46.5 ml 21.00 ml/m  RA Volume:   47.20 ml  21.32 ml/m LA Biplane Vol: 57.0 ml 25.74 ml/m AORTIC VALVE LVOT Vmax:   125.00 cm/s LVOT Vmean:  78.500 cm/s LVOT VTI:    0.250 m  AORTA Ao Root diam: 3.80 cm Ao  Asc diam:  3.20 cm  MITRAL VALVE               TRICUSPID VALVE MV Area (PHT): 3.40 cm    TR Peak grad:   25.4 mmHg MV Decel Time: 223 msec    TR Vmax:        252.00 cm/s MV E velocity: 72.30 cm/s  Estimated RAP:  3.00 mmHg MV A velocity: 63.40 cm/s  RVSP:           28.4 mmHg MV E/A ratio:  1.14 SHUNTS Systemic VTI:  0.25 m Systemic Diam: 2.10 cm  Dorothye Gathers MD Electronically signed by Dorothye Gathers MD Signature Date/Time: 04/20/2022/9:49:13 PM    Final    MONITORS  LONG TERM MONITOR (3-14 DAYS) 06/02/2022  Narrative   Patient had a minimum heart rate of 45 bpm, maximum heart rate of 170 bpm, and average heart rate of 74 bpm.   Predominant underlying rhythm was sinus rhythm.   No atrial fibrillation.   Isolated PACs were rare (<1.0%).   Isolated PVCs were rare (<1.0%).   Triggered and diary events associated with sinus rhythm and sinus tachycardia.  No recurrent atrial  fibrillation.   CT SCANS  CT CORONARY MORPH W/CTA COR W/SCORE 04/10/2020  Addendum 04/10/2020  3:59 PM ADDENDUM REPORT: 04/10/2020 15:57  CLINICAL DATA:  76M with DOE  EXAM: Cardiac/Coronary CTA  TECHNIQUE: The patient was scanned on a [employer].  FINDINGS: A 100 kV prospective scan was triggered in the descending thoracic aorta at 111 HU's. Axial non-contrast 3 mm slices were carried out through the heart. The data set was analyzed on a dedicated work station and scored using the Agatson method. Gantry rotation speed was 250 msecs and collimation was .6 mm. 0.8 mg of sl NTG was given. The 3D data set was reconstructed in 5% intervals of the 35-75 % of the R-R cycle. Phases were analyzed on a dedicated work station using MPR, MIP and VRT modes. The patient received 80 cc of contrast.  Coronary Arteries:  Normal coronary origin.  Right dominance.  RCA is a large dominant artery that gives rise to PDA and PLA. There is no plaque.  Left main is a large artery that gives rise to LAD and LCX arteries.  LAD is a large vessel. Noncalcified plaque in the ostial LAD causes minimal (0-24%) stenosis  LCX is a non-dominant artery that gives rise to one large OM1 branch. There is no plaque.  Other findings:  Left Ventricle: Mild dilatation  Left Atrium: Mild enlargement  Pulmonary Veins: Normal configuration  Right Ventricle: Mild dilatation  Right Atrium: Mild enlargement  Cardiac valves: No calcifications  Thoracic aorta: Normal size  Pulmonary Arteries: Normal size  Systemic Veins: Normal drainage  Pericardium: Normal thickness  IMPRESSION: 1. Coronary calcium  score of 0.  2. Normal coronary origin with right dominance.  3. Nonobstructive CAD with noncalcified plaque causing minimal (0-24%) stenosis in ostial LAD  CAD-RADS 1. Minimal non-obstructive CAD (0-24%). Consider non-atherosclerotic causes of chest pain. Consider preventive therapy and  risk factor modification.   Electronically Signed By: Carson Clara MD On: 04/10/2020 15:57  Narrative EXAM: OVER-READ INTERPRETATION  CT CHEST  The following report is an over-read performed by radiologist Dr. Alexandria Angel of [facility] on 04/10/2020. This over-read does not include interpretation of cardiac or coronary anatomy or pathology. The coronary calcium  score/coronary CTA interpretation by the cardiologist is attached.  COMPARISON:  Chest CT 01/03/2009.  FINDINGS: Atherosclerotic  calcifications in the thoracic aorta. Within the visualized portions of the thorax there are no suspicious appearing pulmonary nodules or masses, there is no acute consolidative airspace disease, no pleural effusions, no pneumothorax and no lymphadenopathy. Visualized portions of the upper abdomen are unremarkable. There are no aggressive appearing lytic or blastic lesions noted in the visualized portions of the skeleton.  IMPRESSION: 1.  Aortic Atherosclerosis (ICD10-I70.0).  Electronically Signed: By: Alexandria Angel M.D. On: 04/10/2020 14:59     ______________________________________________________________________________________________      Recent Labs: 04/29/2023: ALT 20; BUN 18; Creatinine, Ser 1.05; Hemoglobin 15.9; Platelets 254; Potassium 4.6; Sodium 142  Recent Lipid Panel    Component Value Date/Time   CHOL 156 02/09/2023 0836   TRIG 93 02/09/2023 0836   HDL 52 02/09/2023 0836   CHOLHDL 3.0 02/09/2023 0836   CHOLHDL 4.1 07/24/2013 0804   VLDL 25 07/24/2013 0804   LDLCALC 87 02/09/2023 0836    Risk Assessment/Calculations:    CHA2DS2-VASc Score = 3  This indicates a 3.2% annual risk of stroke. The patient's score is based upon: CHF History: 0 HTN History: 0 Diabetes History: 0 Stroke History: 2 Vascular Disease History: 0 Age Score: 1 Gender Score: 0     Physical Exam:    VS:  BP 122/78 (BP Location: Left Arm)   Pulse (!) 58    Ht 5\' 11"  (1.803 m)   Wt 96.6 kg   SpO2 95%   BMI 29.71 kg/m     Wt Readings from Last 3 Encounters:  05/03/23 96.6 kg  03/29/23 97.5 kg  12/08/22 99.3 kg   Gen: No distress   Neck: No JVD Cardiac: No Rubs or Gallops, no murmur, regular bradycardia, +2 radial pulses Respiratory: Clear to auscultation bilaterally, normal effort, normal  respiratory rate GI: Soft, nontender, non-distended  MS: No edema;  moves all extremities Integument: Skin feels warm Neuro:  At time of evaluation, alert and oriented to person/place/time/situation  Psych: Normal affect, patient feels fair  ASSESSMENT:    1. Preop cardiovascular exam   2. PAF (paroxysmal atrial fibrillation) (HCC)   3. Coronary artery disease involving native heart without angina pectoris, unspecified vessel or lesion type   4. Aortic atherosclerosis (HCC)   5. Thoracic aorta atherosclerosis (HCC)   6. Hyperlipidemia, unspecified hyperlipidemia type   7. Dizziness     PLAN:     Paroxysmal Atrial Fibrillation Paroxysmal atrial fibrillation with no recent symptomatic episodes. Currently on Eliquis  5 mg PO BID for anticoagulation. Heart rate is well-controlled, and he is in sinus rhythm today. Asymptomatic atrial fibrillation may contribute to dizziness. - Continue Eliquis  5 mg PO BID. - Monitor heart rhythm with a one-week heart monitor to assess for atrial fibrillation burden.  Dizziness - Experiencing daily lightheadedness for the past month, primarily in the morning and around lunchtime. No associated chest pain, pressure, or tightness. Blood pressure remains stable at 120/70 mmHg without positional changes. Differential diagnosis includes non-cardiac causes or potential atrial fibrillation episodes without palpitations. Echocardiogram from last year showed no structural heart issues. - Order a one-week heart monitor to assess for atrial fibrillation. - If atrial fibrillation is detected, refer to AFib clinic for more  aggressive management. - If no atrial fibrillation is detected, this may be a non-cardiac causes. - follows with Dr. Fairy Homer  Aortic Atherosclerosis Aortic atherosclerosis with borderline aortic dilation. Echocardiogram from last year showed no evidence of aortic dilation or valve disease. - LDL goal < 70  Hyperlipidemia LDL cholesterol is slightly  above the goal on the current dose of atorvastatin . He has a calcium  score of zero but has aortic atherosclerosis. - Continue current dose of atorvastatin  for now  If normal monitor one year f/u with Renelda Carry or myself     Medication Adjustments/Labs and Tests Ordered: Current medicines are reviewed at length with the patient today.  Concerns regarding medicines are outlined above.  Orders Placed This Encounter  Procedures   LONG TERM MONITOR (3-14 DAYS)   EKG 12-Lead    No orders of the defined types were placed in this encounter.    Patient Instructions  Testing/Procedures: 1 week zio  Your physician has requested that you wear a Zio heart monitor for __7___ days. This will be mailed to your home with instructions on how to apply the monitor and how to return it when finished. Please allow 2 weeks after returning the heart monitor before our office calls you with the results.   Follow-Up: At [facility], you and your health needs are our priority.  As part of our continuing mission to provide you with exceptional heart care, our providers are all part of one team.  This team includes your primary Cardiologist (physician) and Advanced Practice Providers or APPs (Physician Assistants and Nurse Practitioners) who all work together to provide you with the care you need, when you need it.  Your next appointment:   1 year(s)  Provider:   Jann Melody, MD or Charles Connor, NP    Other Instructions  Delane Fear- Long Term Monitor Instructions     Your physician has requested you wear a ZIO patch monitor for 3 days.  This  is a single patch monitor. [employer] supplies one patch monitor per enrollment. Additional  stickers are not available. Please do not apply patch if you will be having a Nuclear Stress Test,  Echocardiogram, Cardiac CT, MRI, or Chest Xray during the period you would be wearing the  monitor. The patch cannot be worn during these tests. You cannot remove and re-apply the  ZIO XT patch monitor.  Your ZIO patch monitor will be mailed 3 day USPS to your address on file. It may take 3-5 days  to receive your monitor after you have been enrolled.  Once you have received your monitor, please review the enclosed instructions. Your monitor  has already been registered assigning a specific monitor serial # to you.     Billing and Patient Assistance Program Information     We have supplied [employer] with any of your insurance information on file for billing purposes.  [employer] offers a sliding scale Patient Assistance Program for patients that do not have  insurance, or whose insurance does not completely cover the cost of the ZIO monitor.  You must apply for the Patient Assistance Program to qualify for this discounted rate.  To apply, please call [employer] at [phone], select option 4, select option 2, ask to apply for  Patient Assistance Program. Sanna Crystal will ask your household income, and how many people  are in your household. They will quote your out-of-pocket cost based on that information.  [employer] will also be able to set up a 65-month, interest-free payment plan if needed.     Applying the monitor     Shave hair from upper left chest.  Hold abrader disc by orange tab. Rub abrader in 40 strokes over the upper left chest as  indicated in your monitor instructions.  Clean area with 4 enclosed alcohol pads. Let dry.  Apply patch as indicated in monitor instructions. Patch will be placed under collarbone on left  side of chest with arrow pointing upward.  Rub patch adhesive wings for 2 minutes.  Remove white label marked "1". Remove the white  label marked "2". Rub patch adhesive wings for 2 additional minutes.  While looking in a mirror, press and release button in center of patch. A small green light will  flash 3-4 times. This will be your only indicator that the monitor has been turned on.  Do not shower for the first 24 hours. You may shower after the first 24 hours.  Press the button if you feel a symptom. You will hear a small click. Record Date, Time and  Symptom in the Patient Logbook.  When you are ready to remove the patch, follow instructions on the last 2 pages of Patient  Logbook. Stick patch monitor onto the last page of Patient Logbook.  Place Patient Logbook in the blue and white box. Use locking tab on box and tape box closed  securely. The blue and white box has prepaid postage on it. Please place it in the mailbox as  soon as possible. Your physician should have your test results approximately 7 days after the  monitor has been mailed back to [facility] [facility]  Call [facility] Customer Care at [phone] if you have questions regarding  your ZIO XT patch monitor. Call them immediately if you see an orange light blinking on your  monitor.  If your monitor falls off in less than 4 days, contact our Monitor department at [phone].  If your monitor becomes loose or falls off after 4 days call [employer] at [phone] for  suggestions on securing your monitor.        1st Floor: - Lobby - Registration  - Pharmacy  - Lab - Cafe  2nd Floor: - PV Lab - Diagnostic Testing (echo, CT, nuclear med)  3rd Floor: - Vacant  4th Floor: - TCTS (cardiothoracic surgery) - AFib Clinic - Structural Heart Clinic - Vascular Surgery  - Vascular Ultrasound  5th Floor: - HeartCare Cardiology (general and EP) - Clinical Pharmacy for coumadin, hypertension, lipid, weight-loss medications, and med management appointments    Valet parking services will be  available as well.     Gloriann Larger, MD FASE [facility] Cardiologist [facility]  [address] [place], [address] [place], [place] [address] [phone]  12:47 PM

## 2023-05-03 NOTE — Patient Instructions (Addendum)
 Testing/Procedures: 1 week zio  Your physician has requested that you wear a Zio heart monitor for __7___ days. This will be mailed to your home with instructions on how to apply the monitor and how to return it when finished. Please allow 2 weeks after returning the heart monitor before our office calls you with the results.   Follow-Up: At [facility], you and your health needs are our priority.  As part of our continuing mission to provide you with exceptional heart care, our providers are all part of one team.  This team includes your primary Cardiologist (physician) and Advanced Practice Providers or APPs (Physician Assistants and Nurse Practitioners) who all work together to provide you with the care you need, when you need it.  Your next appointment:   1 year(s)  Provider:   Jann Melody, MD or Charles Connor, NP    Other Instructions  Phillip Mcguire- Long Term Monitor Instructions     Your physician has requested you wear a ZIO patch monitor for 3 days.  This is a single patch monitor. [employer] supplies one patch monitor per enrollment. Additional  stickers are not available. Please do not apply patch if you will be having a Nuclear Stress Test,  Echocardiogram, Cardiac CT, MRI, or Chest Xray during the period you would be wearing the  monitor. The patch cannot be worn during these tests. You cannot remove and re-apply the  ZIO XT patch monitor.  Your ZIO patch monitor will be mailed 3 day USPS to your address on file. It may take 3-5 days  to receive your monitor after you have been enrolled.  Once you have received your monitor, please review the enclosed instructions. Your monitor  has already been registered assigning a specific monitor serial # to you.     Billing and Patient Assistance Program Information     We have supplied [employer] with any of your insurance information on file for billing purposes.  [employer] offers a sliding scale Patient Assistance Program for  patients that do not have  insurance, or whose insurance does not completely cover the cost of the ZIO monitor.  You must apply for the Patient Assistance Program to qualify for this discounted rate.  To apply, please call [employer] at [phone], select option 4, select option 2, ask to apply for  Patient Assistance Program. Sanna Crystal will ask your household income, and how many people  are in your household. They will quote your out-of-pocket cost based on that information.  [employer] will also be able to set up a 53-month, interest-free payment plan if needed.     Applying the monitor     Shave hair from upper left chest.  Hold abrader disc by orange tab. Rub abrader in 40 strokes over the upper left chest as  indicated in your monitor instructions.  Clean area with 4 enclosed alcohol pads. Let dry.  Apply patch as indicated in monitor instructions. Patch will be placed under collarbone on left  side of chest with arrow pointing upward.  Rub patch adhesive wings for 2 minutes. Remove white label marked "1". Remove the white  label marked "2". Rub patch adhesive wings for 2 additional minutes.  While looking in a mirror, press and release button in center of patch. A small green light will  flash 3-4 times. This will be your only indicator that the monitor has been turned on.  Do not shower for the first 24 hours. You may shower after the first  24 hours.  Press the button if you feel a symptom. You will hear a small click. Record Date, Time and  Symptom in the Patient Logbook.  When you are ready to remove the patch, follow instructions on the last 2 pages of Patient  Logbook. Stick patch monitor onto the last page of Patient Logbook.  Place Patient Logbook in the blue and white box. Use locking tab on box and tape box closed  securely. The blue and white box has prepaid postage on it. Please place it in the mailbox as  soon as possible. Your physician should have your test results  approximately 7 days after the  monitor has been mailed back to [facility].  Call [facility] Customer Care at [phone] if you have questions regarding  your ZIO XT patch monitor. Call them immediately if you see an orange light blinking on your  monitor.  If your monitor falls off in less than 4 days, contact our Monitor department at [phone].  If your monitor becomes loose or falls off after 4 days call [employer] at [phone] for  suggestions on securing your monitor.        1st Floor: - Lobby - Registration  - Pharmacy  - Lab - Cafe  2nd Floor: - PV Lab - Diagnostic Testing (echo, CT, nuclear med)  3rd Floor: - Vacant  4th Floor: - TCTS (cardiothoracic surgery) - AFib Clinic - Structural Heart Clinic - Vascular Surgery  - Vascular Ultrasound  5th Floor: - HeartCare Cardiology (general and EP) - Clinical Pharmacy for coumadin, hypertension, lipid, weight-loss medications, and med management appointments    Valet parking services will be available as well.

## 2023-06-02 ENCOUNTER — Ambulatory Visit: Payer: Self-pay | Admitting: Internal Medicine

## 2023-06-02 DIAGNOSIS — R42 Dizziness and giddiness: Secondary | ICD-10-CM

## 2023-06-02 DIAGNOSIS — I480 Paroxysmal atrial fibrillation: Secondary | ICD-10-CM | POA: Diagnosis not present

## 2023-06-09 ENCOUNTER — Ambulatory Visit: Payer: BC Managed Care – PPO | Admitting: Family Medicine

## 2023-06-09 VITALS — BP 116/75 | HR 57 | Temp 98.6°F | Ht 71.0 in | Wt 218.4 lb

## 2023-06-09 DIAGNOSIS — L723 Sebaceous cyst: Secondary | ICD-10-CM

## 2023-06-09 DIAGNOSIS — E785 Hyperlipidemia, unspecified: Secondary | ICD-10-CM | POA: Diagnosis not present

## 2023-06-09 MED ORDER — ATORVASTATIN CALCIUM 20 MG PO TABS: 20.0000 mg | ORAL_TABLET | Freq: Every day | ORAL | 2 refills | Status: AC

## 2023-06-09 MED ORDER — APIXABAN 5 MG PO TABS: ORAL_TABLET | ORAL | 11 refills | Status: DC

## 2023-06-09 NOTE — Progress Notes (Signed)
   Subjective:    Patient ID: Phillip Mcguire, male    DOB: [birth year], 68 y.o.   MRN: [id number]  HPI Pt come sin today for 6 month follow. Pt doesn't not have any questions or concerns at this time. Medications and allergies reviewed. Discussed the use of AI scribe software for clinical note transcription with the patient, who gave verbal consent to proceed.  History of Present Illness   Phillip Mcguire is a 68 year old male who presents with a persistent oral issue. He is accompanied by his daughter, Ammon Bales.  He has a persistent oral issue that has been evaluated by an ear, nose, and throat specialist. He is scheduled to see a dentist tomorrow for further assessment.  He is consistent with his medication regimen, taking his blood thinner twice daily. He recently saw his cardiologist, who did not provide refills for his blood thinner, and he is due for a refill to avoid running short.  His overall health is 'pretty good' and his mood has been stable over the past few months. He maintains a relatively healthy diet, including fruits, vegetables, and meats, and attempts to go to bed at a reasonable hour, typically around 8:30 PM, waking up at 3:30 AM.  Recent blood work, including sugar, kidney function, liver function, and hemoglobin levels, were reported as normal. His PSA and cholesterol were checked earlier this year and were also normal.  He maintains a small garden, growing tomatoes, peppers, and zucchini, and is dealing with grass growth in the garden.       Review of Systems     Objective:   Physical Exam General-in no acute distress Eyes-no discharge Lungs-respiratory rate normal, CTA CV-no murmurs,RRR Extremities skin warm dry no edema Neuro grossly normal Behavior normal, alert  Small firm nodule underneath the neck easily compressible      Assessment & Plan:  Patient was seen by ENT regarding this Will be seeing dentist I told him if the dentist did not feel this  is coming from his tooth he needs to connect with us  for an ultrasound Also if not showing improvement with treatment from dentist he also needs an ultrasound patient voices understanding Patient doing well on his blood thinner takes twice a day Doing well on his cholesterol medicine taking daily Doing well on duloxetine  stress levels are good Patient considering retiring later this year Complete wellness checkup later this year in approximately 6 months Patient to do lab work at that time

## 2023-07-19 ENCOUNTER — Encounter: Payer: Self-pay | Admitting: Orthopedic Surgery

## 2023-07-19 ENCOUNTER — Ambulatory Visit: Admitting: Orthopedic Surgery

## 2023-07-19 DIAGNOSIS — M6701 Short Achilles tendon (acquired), right ankle: Secondary | ICD-10-CM

## 2023-07-19 DIAGNOSIS — M6702 Short Achilles tendon (acquired), left ankle: Secondary | ICD-10-CM | POA: Diagnosis not present

## 2023-07-19 NOTE — Progress Notes (Signed)
 Office Visit Note   Patient: Phillip Mcguire           Date of Birth: [birth year]           MRN: [id number] Visit Date: 07/19/2023              Requested by: Alphonsa Glendia LABOR, MD [address] ,  [place] [address] PCP: Alphonsa Glendia LABOR, MD  Chief Complaint  Patient presents with   Right Foot - Pain   Left Foot - Pain      HPI: Patient is a 68 year old gentleman who is seen for initial evaluation for burning pain across the ball of his foot bilaterally.  Assessment & Plan: Visit Diagnoses:  1. Contracture of both Achilles tendons     Plan: Patient was given instructions and demonstrated Achilles stretching.  Also recommended a sole orthotic made of cork with a met pad.  Discussed that with offloading the metatarsal heads the numbness should resolve.  Follow-Up Instructions: Return if symptoms worsen or fail to improve.   Ortho Exam  Patient is alert, oriented, no adenopathy, well-dressed, normal affect, normal respiratory effort. Examination patient has palpable pulses.  With his knee extended he has equinus contracture with ankle dorsiflexion 20 degrees short of neutral bilaterally.  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  Numbness is primarily over the metatarsal heads.    Imaging: No results found. No images are attached to the encounter.  Labs: Lab Results  Component Value Date   HGBA1C 5.7 04/02/2017   HGBA1C 5.6 01/24/2015   HGBA1C 5.4 07/24/2013   ESRSEDRATE 1 10/13/2012     Lab Results  Component Value Date   ALBUMIN 4.4 04/29/2023   ALBUMIN 4.4 02/09/2023   ALBUMIN 4.6 10/02/2021    Lab Results  Component Value Date   MG 1.7 01/02/2020   MG 1.8 12/15/2019   No results found for: VD25OH  No results found for: PREALBUMIN    Latest Ref Rng & Units 04/29/2023    4:07 PM 02/09/2023    8:36 AM 06/01/2022    2:01 PM  CBC EXTENDED  WBC 3.4 - 10.8 x10E3/uL 8.1  6.4  8.1   RBC 4.14 - 5.80 x10E6/uL 5.28   5.66  4.94   Hemoglobin 13.0 - 17.7 g/dL 84.0  83.0  85.1   HCT 37.5 - 51.0 % 46.8  49.2  44.8   Platelets 150 - 450 x10E3/uL 254  278  261   NEUT# 1.4 - 7.0 x10E3/uL 4.1  3.4    Lymph# 0.7 - 3.1 x10E3/uL 3.1  2.4       There is no height or weight on file to calculate BMI.  Orders:  No orders of the defined types were placed in this encounter.  No orders of the defined types were placed in this encounter.    Procedures: No procedures performed  Clinical Data: No additional findings.  ROS:  All other systems negative, except as noted in the HPI. Review of Systems  Objective: Vital Signs: There were no vitals taken for this visit.  Specialty Comments:  No specialty comments available.  PMFS History: Patient Active Problem List   Diagnosis Date Noted   Diarrhea 10/15/2022   Mild persistent asthma without complication 04/07/2022   Other allergic rhinitis 04/07/2022   Seborrheic dermatitis 04/07/2022   Dizziness 04/07/2022   Loud snoring 04/01/2021   Non-restorative sleep 04/01/2021   Excessive daytime sleepiness 04/01/2021   Grief reaction  with prolonged bereavement 04/01/2021   Coronary artery disease involving native heart without angina pectoris 04/17/2020   PAF (paroxysmal atrial fibrillation) (HCC) 03/21/2020   Chronic inflammation of mastoid cavity 12/22/2019   History of left mastoidectomy 12/22/2019   Mixed conductive and sensorineural hearing loss of left ear with restricted hearing of right ear 12/22/2019   Chronic mycotic otitis externa 09/14/2019   Pincer nail deformity 06/08/2018   Pain in finger of both hands 05/30/2018   Impacted cerumen of both ears 05/19/2018   Pain in left knee 03/22/2018   Encounter for screening colonoscopy 12/25/2015   Hemorrhoids 12/25/2015   Chronic swimmer's ear of both sides 04/16/2015   Chronic pansinusitis 03/25/2015   Perennial allergic rhinitis 03/25/2015   Bilateral chronic secretory otitis media 03/11/2015    Dysfunction of both eustachian tubes 03/11/2015   GERD (gastroesophageal reflux disease) 06/10/2014   Chronic lumbar pain 04/20/2014   Peripheral neuropathy 10/20/2012   OSA (obstructive sleep apnea) 07/30/2010   Hyperlipidemia 01/29/2009   Hearing loss 01/25/2009   PAIN IN JOINT, SITE UNSPECIFIED 01/25/2009   Past Medical History:  Diagnosis Date   Abnormal LFTs    mild elevation of total, indirect and direct bilirubin   Allergy     Congenital absence of kidney    Right   GERD (gastroesophageal reflux disease)    Hearing loss    Sleep apnea     Family History  Problem Relation Age of Onset   Heart disease Mother    Hypertension Mother    Diabetes Mother    Hyperlipidemia Mother    Heart disease Father    Lung cancer Father    Hyperlipidemia Father    Diabetes Father    Allergies Son    Cancer Maternal Aunt        breast   Cancer Maternal Uncle        colon   Cancer Paternal Aunt        breast   Cancer Paternal Uncle        brain tumor   Diabetes Sister    Neuropathy Neg Hx     Past Surgical History:  Procedure Laterality Date   COLONOSCOPY  2007   Rourk: normal   COLONOSCOPY N/A 12/26/2015   Procedure: COLONOSCOPY;  Surgeon: Lamar CHRISTELLA Hollingshead, MD;  [facility];  Service: Endoscopy;  Laterality: N/A;  2:00 PM   ESOPHAGOGASTRODUODENOSCOPY  01/2002   Dr. 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    Comment: [employer]  Tobacco Use   Smoking status: Former    Current packs/day: 0.00     Average packs/day: 1.5 packs/day for 15.0 years (22.5 ttl pk-yrs)    Types: Cigarettes    Start date: 01/12/1973    Quit date: 01/13/1988    Years since quitting: 35.5   Smokeless tobacco: Former  Building services engineer status: Never Used  Substance and Sexual Activity   Alcohol use: Yes    Alcohol/week: 0.0 standard drinks of alcohol  Comment: rarely drink beer   Drug use: No   Sexual activity: Yes    Partners: Female

## 2023-08-17 NOTE — Telephone Encounter (Signed)
 See OV  note

## 2023-11-15 ENCOUNTER — Encounter: Payer: Self-pay | Admitting: Radiology

## 2023-11-19 ENCOUNTER — Ambulatory Visit: Payer: Self-pay

## 2023-11-19 NOTE — Telephone Encounter (Signed)
 Appointment scheduled.

## 2023-11-19 NOTE — Telephone Encounter (Signed)
 FYI Only or Action Required?: FYI only for provider: appointment scheduled on 11/19/23.  Patient was last seen in primary care on 06/09/2023 by Alphonsa Glendia LABOR, MD.  Called Nurse Triage reporting Headache.  Symptoms began a week ago.  Interventions attempted: OTC medications: Tylenol and Rest, hydration, or home remedies.  Symptoms are: unchanged.  Triage Disposition: See Physician Within 24 Hours  Patient/caregiver understands and will follow disposition?: Yes Reason for Disposition  [1] MODERATE headache (e.g., interferes with normal activities) AND [2] present > 24 hours AND [3] unexplained  (Exceptions: Pain medicines not tried, typical migraine, or headache part of viral illness.)  Answer Assessment - Initial Assessment Questions Describing it as tunnel vision. Denies double vision but states it is fuzzy looking as well as floaters, like little gnats going across vision, comes and goes. Taken Tylenol here and there, little to no relief. 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 MRIs completed that showed nothing concerning.   1. LOCATION: Where does it hurt?      Behind both of eyes  2. ONSET: When did the headache start? (e.g., minutes, hours, days)      Week or 2 ago  3. PATTERN: Does the pain come and go, or has it been constant since it started?     Constant   4. SEVERITY: How bad is the pain? and What does it keep you from doing?  (e.g., Scale 1-10; mild, moderate, or severe)     Dull  5. RECURRENT SYMPTOM: Have you ever had headaches before? If Yes, ask: When was the last time? and What happened that time?      Denies  6. CAUSE: What do you think is causing the headache?     Unsure  7. MIGRAINE: Have you been diagnosed with migraine headaches? If Yes, ask: Is this headache similar?      Denies  8. HEAD INJURY: Has there been any recent injury to your  head?      Denies  9. OTHER SYMPTOMS: Do you have any other symptoms? (e.g., fever, stiff neck, eye pain, sore throat, cold symptoms)     Denies  Protocols used: [facility]  Copied from CRM #[id number]. Topic: Clinical - Red Word Triage >> Nov 19, 2023 10:02 AM Mia F wrote: Red Word that prompted transfer to Nurse Triage: Has been having a headache for about a week or two. Has been bothering his vision. Has been having blurriness and floaters.

## 2023-11-29 ENCOUNTER — Encounter (HOSPITAL_BASED_OUTPATIENT_CLINIC_OR_DEPARTMENT_OTHER): Payer: Self-pay

## 2023-11-29 ENCOUNTER — Ambulatory Visit: Admitting: Internal Medicine

## 2023-11-29 ENCOUNTER — Ambulatory Visit (INDEPENDENT_AMBULATORY_CARE_PROVIDER_SITE_OTHER)

## 2023-11-29 VITALS — BP 121/72 | HR 57 | Ht 71.0 in | Wt 224.0 lb

## 2023-11-29 DIAGNOSIS — G4733 Obstructive sleep apnea (adult) (pediatric): Secondary | ICD-10-CM | POA: Diagnosis not present

## 2023-11-29 NOTE — Progress Notes (Deleted)
 Phillip Mcguire, male    DOB: October 29, 1955    MRN: [id number]   Brief patient profile:  68  yo*** *** former [facility] pt self-referred back to pulmonary clinic in [place]  11/29/2023  for ***      History of Present Illness  11/29/2023  Pulmonary/ 1st office eval/ [place] [facility] [place] [facility]  No chief complaint on file.    Dyspnea:  *** Cough: *** Sleep: *** SABA use: *** 02 use:*** LDSCT:***  No obvious day to day or daytime pattern/variability or assoc excess/ purulent sputum or mucus plugs or hemoptysis or cp or chest tightness, subjective wheeze or overt sinus or hb symptoms.    Also denies any obvious fluctuation of symptoms with weather or environmental changes or other aggravating or alleviating factors except as outlined above   No unusual exposure hx or h/o childhood pna/ asthma or knowledge of premature birth.  Current Allergies, Complete Past Medical History, Past Surgical History, Family History, and Social History were reviewed in [employer] record.  ROS  The following are not active complaints unless bolded Hoarseness, sore throat, dysphagia, dental problems, itching, sneezing,  nasal congestion or discharge of excess mucus or purulent secretions, ear ache,   fever, chills, sweats, unintended wt loss or wt gain, classically pleuritic or exertional cp,  orthopnea pnd or arm/hand swelling  or leg swelling, presyncope, palpitations, abdominal pain, anorexia, nausea, vomiting, diarrhea  or change in bowel habits or change in bladder habits, change in stools or change in urine, dysuria, hematuria,  rash, arthralgias, visual complaints, headache, numbness, weakness or ataxia or problems with walking or coordination,  change in mood or  memory.            Outpatient Medications Prior to Visit  Medication Sig Dispense Refill   apixaban  (ELIQUIS ) 5 MG TABS tablet 1 bid 60 tablet 11   atorvastatin  (LIPITOR) 20 MG tablet Take 1 tablet (20 mg total) by  mouth daily. 90 tablet 2   azelastine  (ASTELIN ) 0.1 % nasal spray Place 1 spray into both nostrils 2 (two) times daily. Use in each nostril as directed (Patient taking differently: Place 1 spray into both nostrils as needed. Use in each nostril as directed) 30 mL 12   CALCIUM -VITAMIN D PO Take 1 tablet by mouth daily. 200 units calcium , 1100units vit D     Cholecalciferol (VITAMIN D) 2000 units CAPS Take 2,000 Units by mouth daily.     clotrimazole (LOTRIMIN) 1 % external solution as needed (irritation).     DULoxetine  (CYMBALTA ) 30 MG capsule TAKE 1 CAPSULE BY MOUTH EVERY DAY 90 capsule 2   Fluocinolone  Acetonide Scalp (DERMA-SMOOTHE /FS SCALP) 0.01 % OIL leave on overnight or >4h before washing it off. Only use as needed. 118 mL 1   fluticasone  (FLONASE ) 50 MCG/ACT nasal spray Place 1 spray into both nostrils as needed.     ketoconazole  (NIZORAL ) 2 % shampoo Apply 1 Application topically as needed.     latanoprost (XALATAN) 0.005 % ophthalmic solution as needed.     loratadine (CLARITIN) 10 MG tablet Take 10 mg by mouth daily.     Magnesium  Oxide 200 MG TABS Take 200 mg by mouth daily.     metroNIDAZOLE  (METROCREAM ) 0.75 % cream Apply topically 2 (two) times daily. (Patient taking differently: Apply topically as needed.) 45 g 1   Multiple Vitamin (MULTIVITAMIN) capsule Take 1 capsule by mouth daily.     mupirocin ointment (BACTROBAN) 2 % Apply topically 3 (three) times  a week.     omeprazole (PRILOSEC) 20 MG capsule Take 20 mg by mouth daily.      OVER THE COUNTER MEDICATION Take 1 tablet by mouth daily. Zinc     prednisoLONE acetate (PRED FORTE) 1 % ophthalmic suspension Place 1 drop into both eyes daily at 6 (six) AM.     Probiotic Product (PROBIOTIC PO) Take 1 Dose by mouth daily. 300mg      promethazine -dextromethorphan (PROMETHAZINE -DM) 6.25-15 MG/5ML syrup Take 5 mLs by mouth 4 (four) times daily as needed. 118 mL 0   sildenafil  (VIAGRA ) 100 MG tablet Take 1 tablet (100 mg total) by mouth  daily as needed for erectile dysfunction. 5 tablet 5   vitamin C (ASCORBIC ACID) 500 MG tablet Take 500 mg by mouth daily.     Facility-Administered Medications Prior to Visit  Medication Dose Route Frequency Provider Last Rate Last Admin   triamcinolone  acetonide (KENALOG -40) injection 20 mg  20 mg Other Once Hyatt, Max T, [place]        Past Medical History:  Diagnosis Date   Abnormal LFTs    mild elevation of total, indirect and direct bilirubin   Allergy     Congenital absence of kidney    Right   GERD (gastroesophageal reflux disease)    Hearing loss    Sleep apnea       Objective:     There were no vitals taken for this visit.         Assessment

## 2023-11-29 NOTE — Patient Instructions (Signed)
 Return to Orthodontist, Dr. Melba, for refitting of oral appliance.  Information attached regarding Inspire device.  Follow up in 6 months.

## 2023-11-29 NOTE — Progress Notes (Signed)
 @Patient  ID: Phillip Mcguire, male    DOB: 1955/07/24, 68 y.o.   MRN: [id number]  Chief Complaint  Patient presents with   Establish Care    New Sleep     Referring provider: Alphonsa Glendia LABOR, MD  HPI: Discussed the use of AI scribe software for clinical note transcription with the patient, who gave verbal consent to proceed.  History of Present Illness Phillip Mcguire is a 68 year old male who presents with issues related to sleep apnea and mouthpiece discomfort.  He has been experiencing discomfort with his sleep apnea mouthpiece, which was adjusted in July. The mouthpiece no longer fits properly, causing frequent jaw popping and a sensation of gritting his teeth to keep it in place. Recent dental work may have affected the fit of the mouthpiece.  He has not had a follow-up sleep study since the mouthpiece was fitted, although a home sleep study was previously discussed. Initially, there were no issues reported from the last follow-up. However, he now perceives the oral appliance is not functioning as well as before.  He is unsure if he is still snoring at night, as he has not received feedback from others. He anticipates learning more about his snoring after his upcoming marriage.  He initially started with CPAP therapy but discontinued it due to discomfort, feeling claustrophobic, and struggling with the mask during sleep. He has gained some weight, which he acknowledges could affect the severity of his sleep apnea and the effectiveness of the mouthpiece.  He is exploring other treatment options due to ongoing issues with the mouthpiece and previous challenges with CPAP therapy.   Last OV 12/07/2022: History of Present Illness   The patient, a former patient of Dr. Shellia, presents for a six-month follow-up with a history of mild sleep apnea. Past intolerance to CPAP and has been using an oral appliance obtained through Dr. Melba. The patient is unsure if a follow-up sleep study has  been conducted since starting the oral appliance. He reports occasional snoring, even with the appliance, particularly when extremely tired. However, he continues to use the appliance primarily due to his dislike for CPAP.   The patient reports no significant daytime sleepiness unless he does not get enough sleep. He typically sleeps around five hours per night, from 9:30 PM to 3:30 AM, and reports waking up two to three times during this period. He has stopped taking naps after work as it disrupts his nighttime sleep. Occasionally, he may doze off for a few minutes during lunch.   The patient also has a history of insomnia, which he reports has improved and is not as bad as before. He does not take any medications for insomnia. He also has a history of atrial fibrillation.   The patient reports occasional jaw popping, which he attributes to the way he sleeps. He has abstained from smoking and his weight has remained stable. He reports mild shortness of breath during physical exertion but does not need to stop due to it. He was previously prescribed Pulmicort and Xopenex  inhalers for wheezing, but he is no longer using these medications.     TEST/EVENTS : He had home sleep study on 07/24/10 with Dr. Francis Dresser. This showed an AHI of 5 and SpO2 low of 80%.   Allergies  Allergen Reactions   Gabapentin      drowsy    Latex Rash    Immunization History  Administered Date(s) Administered   Fluad Quad(high Dose 65+) 10/07/2020  Fluad Trivalent(High Dose 65+) 12/08/2022   INFLUENZA, HIGH DOSE SEASONAL PF 08/26/2021, 10/04/2023   Influenza Inj Mdck Quad With Preservative 10/11/2018, 10/13/2018   Influenza Split 11/08/2012, 10/13/2014   Influenza-Unspecified 11/03/2019   Janssen (J&J) SARS-COV-2 Vaccination 05/06/2019   PNEUMOCOCCAL CONJUGATE-20 09/25/2021   RSV IGIV 01/19/2022   Respiratory Syncytial Virus Vaccine,Recomb Aduvanted(Arexvy) 01/19/2022   Td 07/10/2013   Zoster  Recombinant(Shingrix) 01/19/2022    Past Medical History:  Diagnosis Date   Abnormal LFTs    mild elevation of total, indirect and direct bilirubin   Allergy     Congenital absence of kidney    Right   GERD (gastroesophageal reflux disease)    Hearing loss    Sleep apnea     Tobacco History: Social History   Tobacco Use  Smoking Status Former   Current packs/day: 0.00   Average packs/day: 1.5 packs/day for 15.0 years (22.5 ttl pk-yrs)   Types: Cigarettes   Start date: 01/12/1973   Quit date: 01/13/1988   Years since quitting: 35.9  Smokeless Tobacco Former   Counseling given: Not Answered   Outpatient Medications Prior to Visit  Medication Sig Dispense Refill   apixaban  (ELIQUIS ) 5 MG TABS tablet 1 bid 60 tablet 11   atorvastatin  (LIPITOR) 20 MG tablet Take 1 tablet (20 mg total) by mouth daily. 90 tablet 2   azelastine  (ASTELIN ) 0.1 % nasal spray Place 1 spray into both nostrils 2 (two) times daily. Use in each nostril as directed (Patient taking differently: Place 1 spray into both nostrils as needed. Use in each nostril as directed) 30 mL 12   CALCIUM -VITAMIN D PO Take 1 tablet by mouth daily. 200 units calcium , 1100units vit D     Cholecalciferol (VITAMIN D) 2000 units CAPS Take 2,000 Units by mouth daily.     clotrimazole (LOTRIMIN) 1 % external solution as needed (irritation).     DULoxetine  (CYMBALTA ) 30 MG capsule TAKE 1 CAPSULE BY MOUTH EVERY DAY 90 capsule 2   Fluocinolone  Acetonide Scalp (DERMA-SMOOTHE /FS SCALP) 0.01 % OIL leave on overnight or >4h before washing it off. Only use as needed. 118 mL 1   fluticasone  (FLONASE ) 50 MCG/ACT nasal spray Place 1 spray into both nostrils as needed.     ketoconazole  (NIZORAL ) 2 % shampoo Apply 1 Application topically as needed.     latanoprost (XALATAN) 0.005 % ophthalmic solution as needed.     loratadine (CLARITIN) 10 MG tablet Take 10 mg by mouth daily.     Magnesium  Oxide 200 MG TABS Take 200 mg by mouth daily.      metroNIDAZOLE  (METROCREAM ) 0.75 % cream Apply topically 2 (two) times daily. (Patient taking differently: Apply topically as needed.) 45 g 1   Multiple Vitamin (MULTIVITAMIN) capsule Take 1 capsule by mouth daily.     mupirocin ointment (BACTROBAN) 2 % Apply topically 3 (three) times a week.     omeprazole (PRILOSEC) 20 MG capsule Take 20 mg by mouth daily.      OVER THE COUNTER MEDICATION Take 1 tablet by mouth daily. Zinc     prednisoLONE acetate (PRED FORTE) 1 % ophthalmic suspension Place 1 drop into both eyes daily at 6 (six) AM.     Probiotic Product (PROBIOTIC PO) Take 1 Dose by mouth daily. 300mg      promethazine -dextromethorphan (PROMETHAZINE -DM) 6.25-15 MG/5ML syrup Take 5 mLs by mouth 4 (four) times daily as needed. 118 mL 0   sildenafil  (VIAGRA ) 100 MG tablet Take 1 tablet (100 mg total) by mouth daily  as needed for erectile dysfunction. 5 tablet 5   vitamin C (ASCORBIC ACID) 500 MG tablet Take 500 mg by mouth daily.     Facility-Administered Medications Prior to Visit  Medication Dose Route Frequency Provider Last Rate Last Admin   triamcinolone  acetonide (KENALOG -40) injection 20 mg  20 mg Other Once Scarbro, Max T, DPM         Review of Systems: as per HPI  Constitutional:   No  weight loss, night sweats,  Fevers, chills, fatigue, or  lassitude.  HEENT:   No headaches,  Difficulty swallowing,  Tooth/dental problems, or  Sore throat,                No sneezing, itching, ear ache, nasal congestion, post nasal drip,   CV:  No chest pain,  Orthopnea, PND, swelling in lower extremities, anasarca, dizziness, palpitations, syncope.   GI  No heartburn, indigestion, abdominal pain, nausea, vomiting, diarrhea, change in bowel habits, loss of appetite, bloody stools.   Resp: No shortness of breath with exertion or at rest.  No excess mucus, no productive cough,  No non-productive cough,  No coughing up of blood.  No change in color of mucus.  No wheezing.  No chest wall  deformity  Skin: no rash or lesions.  GU: no dysuria, change in color of urine, no urgency or frequency.  No flank pain, no hematuria   MS:  No joint pain or swelling.  No decreased range of motion.  No back pain.    Physical Exam  BP 121/72   Pulse (!) 57   Ht 5' 11 (1.803 m)   Wt 224 lb (101.6 kg)   SpO2 97%   BMI 31.24 kg/m   GEN: A/Ox3; pleasant , NAD, well nourished    HEENT:  [place]/AT,  EACs-clear, TMs-wnl, NOSE-clear, THROAT-clear, no lesions, no postnasal drip or exudate noted. Mallampati 2  NECK:  Supple w/ fair ROM; no JVD; normal carotid impulses w/o bruits; no thyromegaly or nodules palpated; no lymphadenopathy.    RESP  Clear  P & A; w/o, wheezes/ rales/ or rhonchi. no accessory muscle use, no dullness to percussion  CARD:  RRR, no m/r/g, no peripheral edema, pulses intact, no cyanosis or clubbing.  GI:   Soft & nt; nml bowel sounds; no organomegaly or masses detected.   Musco: Warm bil, no deformities or joint swelling noted.   Neuro: alert, no focal deficits noted.    Skin: Warm, no lesions or rashes    Lab Results:  CBC    Component Value Date/Time   WBC 8.1 04/29/2023 1607   WBC 7.8 12/15/2019 1540   RBC 5.28 04/29/2023 1607   RBC 5.24 12/15/2019 1540   HGB 15.9 04/29/2023 1607   HCT 46.8 04/29/2023 1607   PLT 254 04/29/2023 1607   MCV 89 04/29/2023 1607   MCH 30.1 04/29/2023 1607   MCH 30.0 12/15/2019 1540   MCHC 34.0 04/29/2023 1607   MCHC 33.6 12/15/2019 1540   RDW 13.0 04/29/2023 1607   LYMPHSABS 3.1 04/29/2023 1607   MONOABS 0.6 10/13/2012 1657   EOSABS 0.2 04/29/2023 1607   BASOSABS 0.1 04/29/2023 1607    BMET    Component Value Date/Time   NA 142 04/29/2023 1607   K 4.6 04/29/2023 1607   CL 100 04/29/2023 1607   CO2 28 04/29/2023 1607   GLUCOSE 95 04/29/2023 1607   GLUCOSE 166 (H) 12/15/2019 1540   BUN 18 04/29/2023 1607   CREATININE 1.05 04/29/2023  1607   CALCIUM  9.7 04/29/2023 1607   GFRNONAA 84 01/02/2020 0943    GFRNONAA >60 12/15/2019 1540   GFRAA 97 01/02/2020 0943    BNP No results found for: BNP  ProBNP No results found for: PROBNP  Imaging: No results found.  Administration History     None          Latest Ref Rng & Units 07/02/2020   10:43 AM  PFT Results  FVC-Pre L 4.38   FVC-Predicted Pre % 87   FVC-Post L 4.40   FVC-Predicted Post % 88   Pre FEV1/FVC % % 82   Post FEV1/FCV % % 81   FEV1-Pre L 3.58   FEV1-Predicted Pre % 95   FEV1-Post L 3.58   DLCO uncorrected ml/min/mmHg 29.64   DLCO UNC% % 103   DLVA Predicted % 108   TLC L 6.96   TLC % Predicted % 93   RV % Predicted % 96     No results found for: NITRICOXIDE   Assessment & Plan:   Assessment & Plan OSA (obstructive sleep apnea)  Assessment and Plan Assessment & Plan Obstructive sleep apnea management and treatment options Obstructive sleep apnea with CPAP intolerance due to claustrophobia. Oral appliance therapy causing malalignment issues. CPAP remains most effective, but alternatives include weight management, sleep hygiene, and Inspire device surgery. He seeks options beyond oral appliance due to discomfort and potential weight gain. - Provided information on Inspire device for review. - Will revisit CPAP options and mask desensitization at follow-up. - Encouraged weight management and sleep hygiene practices.  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 Previous adjustments provided temporary relief. Current misalignment causing increased jaw popping and discomfort. - Referred to orthodontist for oral appliance refitting.  Overweight and its impact on sleep apnea Weight gain may exacerbate sleep apnea symptoms. Weight management is crucial as it can influence severity of sleep apnea. - Encouraged weight management strategies. - May need follow up sleep study to clarify the degree of OSA as patient does  c/o continued brain fog.      Return in about 6 months (around 05/28/2024) for review effectiveness of sleep interventions.  Candis Dandy, PA-C 11/29/2023

## 2023-12-13 ENCOUNTER — Ambulatory Visit: Admitting: Family Medicine

## 2023-12-13 VITALS — BP 129/78 | Ht 71.0 in | Wt 226.1 lb

## 2023-12-13 DIAGNOSIS — R519 Headache, unspecified: Secondary | ICD-10-CM | POA: Diagnosis not present

## 2023-12-13 DIAGNOSIS — E785 Hyperlipidemia, unspecified: Secondary | ICD-10-CM | POA: Diagnosis not present

## 2023-12-13 DIAGNOSIS — Z79899 Other long term (current) drug therapy: Secondary | ICD-10-CM

## 2023-12-13 DIAGNOSIS — Z0001 Encounter for general adult medical examination with abnormal findings: Secondary | ICD-10-CM | POA: Diagnosis not present

## 2023-12-13 DIAGNOSIS — Z0000 Encounter for general adult medical examination without abnormal findings: Secondary | ICD-10-CM

## 2023-12-13 NOTE — Progress Notes (Signed)
 Subjective:    Patient ID: Phillip Mcguire, male    DOB: [birth year], 68 y.o.   MRN: [id number]  HPI The patient comes in today for a wellness visit.    A review of their health history was completed.  A review of medications was also completed.  Any needed refills; none  Eating habits: Eats healthy for the most part  Falls/  MVA accidents in past few months: No accidents or injuries recently  Regular exercise: Stays physically active at work  Specialist pt sees on regular basis: None  Preventative health issues were discussed.   Additional concerns: None  Discussed the use of AI scribe software for clinical note transcription with the patient, who gave verbal consent to proceed.  History of Present Illness   Phillip Mcguire is a 68 year old male who presents with a three-week history of headaches.  He has been experiencing headaches for the past three weeks, occurring during the day and recently radiating down his neck. The pain is persistent throughout the day and is particularly bothersome behind his left eye. He describes visual disturbances as 'flaring' rather than double vision. He has woken up with a headache the last two mornings around 3:30 AM, which aligns with his usual wake-up time for work.  No vomiting or double vision. He uses a mouthpiece for sleep apnea and does not report snoring. He has not been taking any specific medication for the headaches, stating that the pain 'goes away but comes right back'.  His current medications include cholesterol medicine and a blood thinner, which he is taking regularly. No bleeding issues reported and his mood is reasonable. He describes his diet as 'halfway healthy' and maintains regular physical activity through work and other activities. He consumes coffee in moderation and denies excessive caffeine use.     Patient does not typically have headaches over the past 3 weeks has had severe headaches both frontal in the back of the  head last all day long possible could be sleep apnea related but could also be temporal arteritis but also could be underlying growths cyst or tumor.  Needs further workup Patient does state his vision gets fuzzy denies double vision denies nausea vomiting denies unilateral numbness weakness Review of Systems     Objective:   Physical Exam  General-in no acute distress Eyes-no discharge Lungs-respiratory rate normal, CTA CV-no murmurs,RRR Extremities skin warm dry no edema Neuro grossly normal Behavior normal, alert Prostate exam normal      Assessment & Plan:  1. Well adult exam (Primary) Adult wellness-complete.wellness physical was conducted today. Importance of diet and exercise were discussed in detail.  Importance of stress reduction and healthy living were discussed.  In addition to this a discussion regarding safety was also covered.  We also reviewed over immunizations and gave recommendations regarding current immunization needed for age.   In addition to this additional areas were also touched on including: Preventative health exams needed:  Colonoscopy 2027  Patient was advised yearly wellness exam   2. Hyperlipidemia, unspecified hyperlipidemia type Labs to be ordered  3. Severe frontal headaches Labs ordered If labs are negative pursue forward with MRI Patient does not typically have headaches This is new onset needs further diagnostic studies to rule out more serious issues - Sedimentation rate - CBC with Differential - Basic Metabolic Panel (BMET) - C-reactive protein  4. High risk medication use See above  It should be noted that the labs came back negative for  temporal arteritis.  Given that he is not someone to have headaches and this is a new onset over the past several weeks of severe headaches that are daily it is recommended to do MRI to rule out the possibility of structural problem including cyst growth or tumor.  We will pursue forward with  getting the MRI.

## 2023-12-15 ENCOUNTER — Ambulatory Visit: Payer: Self-pay | Admitting: Family Medicine

## 2023-12-15 LAB — BASIC METABOLIC PANEL WITH GFR
BUN/Creatinine Ratio: 16 (ref 10–24)
BUN: 19 mg/dL (ref 8–27)
CO2: 24 mmol/L (ref 20–29)
Calcium: 9.7 mg/dL (ref 8.6–10.2)
Chloride: 99 mmol/L (ref 96–106)
Creatinine, Ser: 1.17 mg/dL (ref 0.76–1.27)
Glucose: 89 mg/dL (ref 70–99)
Potassium: 4.2 mmol/L (ref 3.5–5.2)
Sodium: 138 mmol/L (ref 134–144)
eGFR: 68 mL/min/1.73 (ref >59)

## 2023-12-15 LAB — CBC WITH DIFFERENTIAL/PLATELET
Basophils Absolute: 0 x10E3/uL (ref 0.0–0.2)
Basos: 1 %
EOS (ABSOLUTE): 0.1 x10E3/uL (ref 0.0–0.4)
Eos: 2 %
Hematocrit: 45.1 % (ref 37.5–51.0)
Hemoglobin: 15 g/dL (ref 13.0–17.7)
Immature Grans (Abs): 0 x10E3/uL (ref 0.0–0.1)
Immature Granulocytes: 0 %
Lymphocytes Absolute: 2.6 x10E3/uL (ref 0.7–3.1)
Lymphs: 31 %
MCH: 29.3 pg (ref 26.6–33.0)
MCHC: 33.3 g/dL (ref 31.5–35.7)
MCV: 88 fL (ref 79–97)
Monocytes Absolute: 0.5 x10E3/uL (ref 0.1–0.9)
Monocytes: 7 %
Neutrophils Absolute: 5 x10E3/uL (ref 1.4–7.0)
Neutrophils: 59 %
Platelets: 262 x10E3/uL (ref 150–450)
RBC: 5.12 x10E6/uL (ref 4.14–5.80)
RDW: 12.6 % (ref 11.6–15.4)
WBC: 8.3 x10E3/uL (ref 3.4–10.8)

## 2023-12-15 LAB — SEDIMENTATION RATE: Sed Rate: 11 mm/h (ref 0–30)

## 2023-12-15 LAB — C-REACTIVE PROTEIN: CRP: 2 mg/L (ref 0–10)

## 2023-12-22 ENCOUNTER — Other Ambulatory Visit: Payer: Self-pay

## 2023-12-22 DIAGNOSIS — R519 Headache, unspecified: Secondary | ICD-10-CM

## 2024-01-20 ENCOUNTER — Other Ambulatory Visit: Payer: Self-pay | Admitting: Family Medicine

## 2024-01-21 ENCOUNTER — Other Ambulatory Visit: Payer: Self-pay | Admitting: Internal Medicine

## 2024-01-21 MED ORDER — APIXABAN 5 MG PO TABS: ORAL_TABLET | ORAL | 11 refills | Status: AC

## 2024-01-24 ENCOUNTER — Encounter: Payer: Self-pay | Admitting: Family Medicine
